# Patient Record
Sex: Male | Born: 1950 | Race: White | Hispanic: No | Marital: Married | Smoking: Never smoker
Health system: Southern US, Community
[De-identification: ages and names within clinical notes are randomized; demographics above are authoritative.]

## PROBLEM LIST (undated history)

## (undated) DIAGNOSIS — E119 Type 2 diabetes mellitus without complications: Secondary | ICD-10-CM

## (undated) DIAGNOSIS — M1990 Unspecified osteoarthritis, unspecified site: Secondary | ICD-10-CM

## (undated) DIAGNOSIS — I10 Essential (primary) hypertension: Secondary | ICD-10-CM

## (undated) DIAGNOSIS — R112 Nausea with vomiting, unspecified: Secondary | ICD-10-CM

## (undated) DIAGNOSIS — J301 Allergic rhinitis due to pollen: Secondary | ICD-10-CM

## (undated) DIAGNOSIS — E1140 Type 2 diabetes mellitus with diabetic neuropathy, unspecified: Secondary | ICD-10-CM

## (undated) DIAGNOSIS — K219 Gastro-esophageal reflux disease without esophagitis: Secondary | ICD-10-CM

## (undated) DIAGNOSIS — J42 Unspecified chronic bronchitis: Secondary | ICD-10-CM

## (undated) DIAGNOSIS — E669 Obesity, unspecified: Secondary | ICD-10-CM

## (undated) DIAGNOSIS — E039 Hypothyroidism, unspecified: Secondary | ICD-10-CM

## (undated) DIAGNOSIS — Z9889 Other specified postprocedural states: Secondary | ICD-10-CM

## (undated) DIAGNOSIS — J449 Chronic obstructive pulmonary disease, unspecified: Secondary | ICD-10-CM

## (undated) DIAGNOSIS — Z973 Presence of spectacles and contact lenses: Secondary | ICD-10-CM

## (undated) DIAGNOSIS — M1000 Idiopathic gout, unspecified site: Secondary | ICD-10-CM

## (undated) DIAGNOSIS — E7800 Pure hypercholesterolemia, unspecified: Secondary | ICD-10-CM

## (undated) HISTORY — PX: APPENDECTOMY: SHX54

## (undated) HISTORY — PX: INGUINAL HERNIA REPAIR: SUR1180

## (undated) HISTORY — PX: LUMBAR LAMINECTOMY: SHX95

## (undated) HISTORY — PX: FRACTURE SURGERY: SHX138

## (undated) HISTORY — PX: TYMPANOPLASTY: SHX33

---

## 2002-06-22 ENCOUNTER — Ambulatory Visit (HOSPITAL_COMMUNITY): Admission: RE | Admit: 2002-06-22 | Discharge: 2002-06-23 | Payer: Self-pay | Admitting: Neurosurgery

## 2002-06-22 ENCOUNTER — Encounter: Payer: Self-pay | Admitting: Neurosurgery

## 2004-04-25 ENCOUNTER — Ambulatory Visit: Payer: Self-pay

## 2006-12-19 ENCOUNTER — Ambulatory Visit: Payer: Self-pay | Admitting: Emergency Medicine

## 2006-12-21 ENCOUNTER — Ambulatory Visit: Payer: Self-pay | Admitting: Family Medicine

## 2007-03-01 ENCOUNTER — Ambulatory Visit: Payer: Self-pay | Admitting: Family Medicine

## 2010-06-19 ENCOUNTER — Ambulatory Visit: Payer: Self-pay

## 2010-08-13 ENCOUNTER — Ambulatory Visit: Payer: Self-pay | Admitting: Anesthesiology

## 2010-08-17 ENCOUNTER — Ambulatory Visit: Payer: Self-pay | Admitting: General Practice

## 2010-08-17 HISTORY — PX: KNEE ARTHROSCOPY W/ PARTIAL MEDIAL MENISCECTOMY: SHX1882

## 2011-01-22 HISTORY — PX: ROTATOR CUFF REPAIR: SHX139

## 2011-04-09 ENCOUNTER — Ambulatory Visit: Payer: Self-pay | Admitting: Unknown Physician Specialty

## 2011-04-09 LAB — URINALYSIS, COMPLETE
Bilirubin,UR: NEGATIVE
Blood: NEGATIVE
Hyaline Cast: 1
Ketone: NEGATIVE
Ph: 5 (ref 4.5–8.0)
RBC,UR: 1 /HPF (ref 0–5)
Specific Gravity: 1.015 (ref 1.003–1.030)
Squamous Epithelial: NONE SEEN

## 2011-04-09 LAB — CBC
HCT: 40.6 % (ref 40.0–52.0)
MCH: 30.5 pg (ref 26.0–34.0)
MCHC: 33.2 g/dL (ref 32.0–36.0)
MCV: 92 fL (ref 80–100)
RDW: 13.1 % (ref 11.5–14.5)
WBC: 8.4 10*3/uL (ref 3.8–10.6)

## 2011-04-15 ENCOUNTER — Ambulatory Visit: Payer: Self-pay | Admitting: Unknown Physician Specialty

## 2012-03-09 ENCOUNTER — Ambulatory Visit: Payer: Self-pay | Admitting: Gastroenterology

## 2014-05-15 NOTE — Op Note (Signed)
PATIENT NAME:  Nicholas Humphrey, Nicholas Humphrey MR#:  [id number] DATE OF BIRTH:  08-11-1950  DATE OF PROCEDURE:  04/15/2011  PREOPERATIVE DIAGNOSIS: Torn right rotator cuff.  PROCEDURE: Arthroscopic rotator cuff repair   SURGEON: Kathrene Alu., MD   INDICATION FOR PROCEDURE: The patient had a recent injury. He fell and then experienced pain in his right shoulder. His plain films did not reveal any significant abnormality but MR arthrogram did reveal a significant rotator cuff tear. The patient was brought in for surgery.   PROCEDURE: The patient was taken to the preoperative area where satisfactory interscalene block was achieved on his right shoulder. He was then taken to the operating room where satisfactory general anesthesia was achieved. The patient was then turned to the lateral decubitus position with the right shoulder up. The right shoulder was then prepped and draped in the usual fashion for an arthroscopic procedure. The shoulder was suspended with an Acufex shoulder suspension device. The shoulder was maintained in about 25 to 30 degrees of abduction. We initially used 10 pounds of traction but this was changed to 7.   The patient received 2 grams Kefzol IV at the start of the procedure.   Next, the scope was introduced through a posterior puncture wound into the glenohumeral joint. The joint was distended with lactated Ringer's. We used the Mitek fluid pump to facilitate joint distention.   The patient's articular surfaces were smooth. The biceps tendon was intact. The patient's labrum was frayed. The fraying was consistent with his age. No loose bodies were appreciated. The patient was noted to have a significant tear in the area of his supraspinatus and involving some of his infraspinatus.   I removed the scope from the glenohumeral joint and inserted it into the subacromial space.    Visualization of the subacromial space revealed the patient indeed had a significant cuff tear that was  probably retracted about 2 to 3 cm from anterior to posterior. The patient's tear was probably about 3 cm. I established a lateral portal. I inserted a synovial resector to debride thickened bursal tissue. A small elevator was inserted to free up the cuff superiorly. I then inserted a grasper. When I pulled on the cuff, I could bring it back to its attachment to the greater tuberosity.   I went ahead and established an anterior portal for suture management. I also established a portal about the anterolateral aspect of the acromion for insertion of the anchors.   Next, I used the anterolateral portal for insertion of a Spartan anchor into the greater tuberosity near the articular margin. I placed the suture that was in the anchor through the rotator cuff in inverted horizontal mattress fashion. I used the ArthroCare first pass instrument to insert the suture in the cuff. This suture was brought out the anterior portal. I next inserted three more Magnum wire sutures into the cuff in inverted horizontal mattress fashion starting from anterior to posterior. Again, I used the first pass to facilitate placing the sutures in the cuff. I then went ahead and inserted from posterior to anterior three ArthroCare speed screws. The speed screws were 5.5 mm in diameter.   Incidentally, the Spartan anchor was 6.5 mm in diameter.   All the Magnum wire inverted mattress sutures placed in the cuff were tightened down to each speed screw again working from posterior to anterior. After the cuff was reapproximated to its footprint with the above anchor and suture system, I went ahead and tied down  the suture attached to the [facility] implant. This gave me a two row repair of the torn cuff.   Inspection of the cuff at this time revealed that it seemed to be well secured and was quite stable.   I removed the scope and then closed the puncture wounds with 3-0 nylon in vertical mattress fashion. Several mL of 0.5% Marcaine without  epinephrine was injected about each puncture wound and then Betadine was applied to the wounds followed by a sterile dressing. Four TENS pads were placed about the shoulder and then a shoulder immobilizer was applied. The patient was then awakened and transferred to his stretcher bed. He was taken to the recovery room in satisfactory condition. Blood loss was negligible.   An ice pack was applied to his right shoulder when he was transferred to the recovery room.      SUMMARY OF IMPLANTS USED: I used a Spartan 6.5 mm anchor and three 5.5 speed screw anchors.    ____________________________ Kathrene Alu., MD hbk:drc D: 04/15/2011 17:30:13 ET T: 04/15/2011 17:41:53 ET JOB#: [id number]  cc: Kathrene Alu., MD, <Dictator> Vilinda Flake, JR MD ELECTRONICALLY SIGNED 05/12/2011 21:22

## 2015-09-27 ENCOUNTER — Encounter: Payer: Self-pay | Admitting: *Deleted

## 2015-10-04 ENCOUNTER — Ambulatory Visit: Payer: BLUE CROSS/BLUE SHIELD | Admitting: Anesthesiology

## 2015-10-04 ENCOUNTER — Ambulatory Visit
Admission: RE | Admit: 2015-10-04 | Discharge: 2015-10-04 | Disposition: A | Discharge status: Home / Self Care | Payer: BLUE CROSS/BLUE SHIELD | Source: Ambulatory Visit | Attending: Surgery | Admitting: Surgery

## 2015-10-04 ENCOUNTER — Encounter
Admission: RE | Disposition: A | Discharge status: Home / Self Care | Payer: Self-pay | Source: Ambulatory Visit | Attending: Surgery

## 2015-10-04 DIAGNOSIS — E1121 Type 2 diabetes mellitus with diabetic nephropathy: Secondary | ICD-10-CM | POA: Diagnosis not present

## 2015-10-04 DIAGNOSIS — Z823 Family history of stroke: Secondary | ICD-10-CM | POA: Diagnosis not present

## 2015-10-04 DIAGNOSIS — Z833 Family history of diabetes mellitus: Secondary | ICD-10-CM | POA: Diagnosis not present

## 2015-10-04 DIAGNOSIS — Z7982 Long term (current) use of aspirin: Secondary | ICD-10-CM | POA: Insufficient documentation

## 2015-10-04 DIAGNOSIS — E1140 Type 2 diabetes mellitus with diabetic neuropathy, unspecified: Secondary | ICD-10-CM | POA: Insufficient documentation

## 2015-10-04 DIAGNOSIS — Z803 Family history of malignant neoplasm of breast: Secondary | ICD-10-CM | POA: Insufficient documentation

## 2015-10-04 DIAGNOSIS — E785 Hyperlipidemia, unspecified: Secondary | ICD-10-CM | POA: Insufficient documentation

## 2015-10-04 DIAGNOSIS — M65341 Trigger finger, right ring finger: Secondary | ICD-10-CM | POA: Insufficient documentation

## 2015-10-04 DIAGNOSIS — K219 Gastro-esophageal reflux disease without esophagitis: Secondary | ICD-10-CM | POA: Diagnosis not present

## 2015-10-04 DIAGNOSIS — E039 Hypothyroidism, unspecified: Secondary | ICD-10-CM | POA: Diagnosis not present

## 2015-10-04 DIAGNOSIS — I10 Essential (primary) hypertension: Secondary | ICD-10-CM | POA: Insufficient documentation

## 2015-10-04 DIAGNOSIS — Z6834 Body mass index (BMI) 34.0-34.9, adult: Secondary | ICD-10-CM | POA: Diagnosis not present

## 2015-10-04 DIAGNOSIS — Z794 Long term (current) use of insulin: Secondary | ICD-10-CM | POA: Diagnosis not present

## 2015-10-04 HISTORY — DX: Gastro-esophageal reflux disease without esophagitis: K21.9

## 2015-10-04 HISTORY — DX: Type 2 diabetes mellitus without complications: E11.9

## 2015-10-04 HISTORY — DX: Essential (primary) hypertension: I10

## 2015-10-04 HISTORY — DX: Type 2 diabetes mellitus with diabetic neuropathy, unspecified: E11.40

## 2015-10-04 HISTORY — DX: Presence of spectacles and contact lenses: Z97.3

## 2015-10-04 HISTORY — DX: Hypothyroidism, unspecified: E03.9

## 2015-10-04 HISTORY — DX: Other specified postprocedural states: Z98.890

## 2015-10-04 HISTORY — DX: Nausea with vomiting, unspecified: R11.2

## 2015-10-04 HISTORY — PX: TRIGGER FINGER RELEASE: SHX641

## 2015-10-04 HISTORY — DX: Pure hypercholesterolemia, unspecified: E78.00

## 2015-10-04 LAB — GLUCOSE, CAPILLARY
GLUCOSE-CAPILLARY: 164 mg/dL — AB (ref 65–99)
GLUCOSE-CAPILLARY: 160 mg/dL — AB (ref 65–99)

## 2015-10-04 SURGERY — RELEASE, A1 PULLEY, FOR TRIGGER FINGER
Anesthesia: Monitor Anesthesia Care | Laterality: Right | Wound class: Clean

## 2015-10-04 MED ORDER — TRAMADOL HCL 50 MG PO TABS: 50.0000 mg | ORAL_TABLET | Freq: Four times a day (QID) | ORAL | 0 refills | Status: DC | PRN

## 2015-10-04 MED ORDER — CEFAZOLIN SODIUM-DEXTROSE 2-4 GM/100ML-% IV SOLN
2.0000 g | Freq: Once | INTRAVENOUS | Status: AC
  Administered 2015-10-04: 2 g via INTRAVENOUS

## 2015-10-04 MED ORDER — HYDROCODONE-ACETAMINOPHEN 5-325 MG PO TABS: 1.0000 | ORAL_TABLET | ORAL | Status: DC | PRN

## 2015-10-04 MED ORDER — MIDAZOLAM HCL 5 MG/5ML IJ SOLN
INTRAMUSCULAR | Status: DC | PRN
  Administered 2015-10-04: 2 mg via INTRAVENOUS

## 2015-10-04 MED ORDER — LACTATED RINGERS IV SOLN
INTRAVENOUS | Status: DC
  Administered 2015-10-04 (×2): via INTRAVENOUS

## 2015-10-04 MED ORDER — PROPOFOL 500 MG/50ML IV EMUL
INTRAVENOUS | Status: DC | PRN
Start: 2015-10-04 — End: 2015-10-04
  Administered 2015-10-04: 50 ug/kg/min via INTRAVENOUS

## 2015-10-04 MED ORDER — BUPIVACAINE HCL (PF) 0.5 % IJ SOLN
INTRAMUSCULAR | Status: DC | PRN
  Administered 2015-10-04: 10 mL

## 2015-10-04 MED ORDER — ONDANSETRON HCL 4 MG/2ML IJ SOLN
4.0000 mg | Freq: Once | INTRAMUSCULAR | Status: AC | PRN
  Administered 2015-10-04: 4 mg via INTRAVENOUS

## 2015-10-04 MED ORDER — OXYCODONE HCL 5 MG PO TABS: 5.0000 mg | ORAL_TABLET | Freq: Once | ORAL | Status: DC

## 2015-10-04 MED ORDER — FENTANYL CITRATE (PF) 100 MCG/2ML IJ SOLN
25.0000 ug | INTRAMUSCULAR | Status: DC | PRN
  Administered 2015-10-04: 100 ug via INTRAVENOUS

## 2015-10-04 SURGICAL SUPPLY — 23 items
BANDAGE ELASTIC 2 LF NS (GAUZE/BANDAGES/DRESSINGS) ×3 IMPLANT
BNDG ESMARK 4X12 TAN STRL LF (GAUZE/BANDAGES/DRESSINGS) ×3 IMPLANT
CHLORAPREP W/TINT 26ML (MISCELLANEOUS) ×3 IMPLANT
CORD BIP STRL DISP 12FT (MISCELLANEOUS) ×3 IMPLANT
COVER LIGHT HANDLE UNIVERSAL (MISCELLANEOUS) ×6 IMPLANT
CUFF TOURNIQUET DUAL PORT 18X3 (MISCELLANEOUS) ×3 IMPLANT
DECANTER SPIKE VIAL GLASS SM (MISCELLANEOUS) ×3 IMPLANT
GAUZE PETRO XEROFOAM 1X8 (MISCELLANEOUS) ×3 IMPLANT
GAUZE SPONGE 4X4 12PLY STRL (GAUZE/BANDAGES/DRESSINGS) ×3 IMPLANT
GLOVE BIO SURGEON STRL SZ8 (GLOVE) ×6 IMPLANT
GLOVE INDICATOR 8.0 STRL GRN (GLOVE) ×3 IMPLANT
GOWN STRL REUS W/ TWL LRG LVL3 (GOWN DISPOSABLE) ×1 IMPLANT
GOWN STRL REUS W/ TWL XL LVL3 (GOWN DISPOSABLE) ×1 IMPLANT
GOWN STRL REUS W/TWL LRG LVL3 (GOWN DISPOSABLE) ×2
GOWN STRL REUS W/TWL XL LVL3 (GOWN DISPOSABLE) ×2
KIT ROOM TURNOVER OR (KITS) ×3 IMPLANT
NS IRRIG 500ML POUR BTL (IV SOLUTION) ×3 IMPLANT
PACK EXTREMITY ARMC (MISCELLANEOUS) ×3 IMPLANT
STOCKINETTE IMPERVIOUS 9X36 MD (GAUZE/BANDAGES/DRESSINGS) ×3 IMPLANT
STRAP BODY AND KNEE 60X3 (MISCELLANEOUS) ×3 IMPLANT
SUT PROLENE 4 0 PS 2 18 (SUTURE) ×3 IMPLANT
SUT VIC AB 3-0 SH 27 (SUTURE)
SUT VIC AB 3-0 SH 27X BRD (SUTURE) IMPLANT

## 2015-10-04 NOTE — H&P (Signed)
Paper H&P to be scanned into permanent record. H&P reviewed. No changes. 

## 2015-10-04 NOTE — Anesthesia Preprocedure Evaluation (Signed)
Anesthesia Evaluation  Patient identified by MRN, date of birth, ID band Patient awake    Reviewed: Allergy & Precautions, H&P , NPO status , Patient's Chart, lab work & pertinent test results  Airway Mallampati: II  TM Distance: >3 FB Neck ROM: full    Dental no notable dental hx.    Pulmonary    Pulmonary exam normal        Cardiovascular hypertension, Normal cardiovascular exam     Neuro/Psych    GI/Hepatic GERD  ,  Endo/Other  diabetesHypothyroidism Morbid obesity  Renal/GU      Musculoskeletal   Abdominal   Peds  Hematology   Anesthesia Other Findings   Reproductive/Obstetrics                             Anesthesia Physical Anesthesia Plan  ASA: II  Anesthesia Plan: Bier Block and MAC   Post-op Pain Management:    Induction:   Airway Management Planned:   Additional Equipment:   Intra-op Plan:   Post-operative Plan:   Informed Consent: I have reviewed the patients History and Physical, chart, labs and discussed the procedure including the risks, benefits and alternatives for the proposed anesthesia with the patient or authorized representative who has indicated his/her understanding and acceptance.     Plan Discussed with:   Anesthesia Plan Comments:         Anesthesia Quick Evaluation

## 2015-10-04 NOTE — Op Note (Signed)
10/04/2015  12:25 PM  Patient:   Nicholas Humphrey  Pre-Op Diagnosis:   Right ring trigger finger.  Post-Op Diagnosis:   Same.  Procedure:   Release right ring trigger finger.  Surgeon:   Pascal Lux, MD  Assistant:   Donnie Coffin, PA-S  Anesthesia:   Bier block  Findings:   As above.  Complications:   None  EBL:   0 cc  Fluids:   600 cc crystalloid  TT:   23 minutes at 250 mmHg  Drains:   None  Closure:   4-0 Prolene  Implants:   None  Brief Clinical Note:   The patient is a 65 year old male with a history of recurrent painful catching of his right ring finger. These symptoms have progressed despite medications, activity modification, etc. The patient's history and examination were consistent with a right ring trigger finger. The patient presents at this time for a right ring trigger finger release.  Procedure:   The patient was brought into the operating room and lain in the supine position. After adequate IV sedation was achieved, a timeout was performed to verify the appropriate surgical site. A Bier block was placed by the anesthesiologist and the tourniquet inflated to 250 mmHg. The right hand and upper extremity were prepped with ChloraPrep solution before being draped sterilely. Preoperative antibiotics were administered. An approximately 1.5-2.0 cm incision was made over the volar aspect of the ring finger at the level of the metacarpal head centered over the flexor sheath. The incision was carried down through the subcutaneous tissues with care taken to identify and protect the digital neurovascular structures. The flexor sheath was entered just proximal to the A1 pulley. The sheath was released proximally for several centimeters under direct visualization. Distally, a clamp was placed beneath the A1 pulley and used to release any adhesions. The clamp was repositioned so that one jaw was superficial to and the other jaw deep to the A1 pulley. The A1 pulley was  incised on either side of the clamp to remove a 2 mm strip of tissue. Metzenbaum scissors was used to ensure complete release of the A1 pulley more distally. The underlying tendons were carefully inspected and found to be intact.   The wound was copiously irrigated with sterile saline solution before the wound was closed using 4-0 Prolene interrupted sutures. A total of 10 cc of 0.5% plain Sensorcaine was injected in and around the incision to help with postoperative analgesia before a sterile bulky dressing was applied to the hand. The patient was then awakened and returned to the recovery room in satisfactory condition after tolerating the procedure well.

## 2015-10-04 NOTE — Anesthesia Procedure Notes (Signed)
Procedure Name: MAC Performed by: Cullan Launer Pre-anesthesia Checklist: Patient identified, Emergency Drugs available, Suction available, Timeout performed and Patient being monitored Patient Re-evaluated:Patient Re-evaluated prior to inductionOxygen Delivery Method: Simple face mask Placement Confirmation: positive ETCO2       

## 2015-10-04 NOTE — Transfer of Care (Signed)
Immediate Anesthesia Transfer of Care Note  Patient: Nicholas Humphrey  Procedure(s) Performed: Procedure(s) with comments: RELEASE TRIGGER FINGER/A-1 PULLEY (Right) - PREFER BIER BLOCK Diabetic - insulin and oral meds  Patient Location: PACU  Anesthesia Type: Bier Block, MAC  Level of Consciousness: awake, alert  and patient cooperative  Airway and Oxygen Therapy: Patient Spontanous Breathing and Patient connected to supplemental oxygen  Post-op Assessment: Post-op Vital signs reviewed, Patient's Cardiovascular Status Stable, Respiratory Function Stable, Patent Airway and No signs of Nausea or vomiting  Post-op Vital Signs: Reviewed and stable  Complications: No apparent anesthesia complications

## 2015-10-04 NOTE — Anesthesia Postprocedure Evaluation (Signed)
Anesthesia Post Note  Patient: Nicholas Humphrey  Procedure(s) Performed: Procedure(s) (LRB): RELEASE TRIGGER FINGER/A-1 PULLEY (Right)  Patient location during evaluation: PACU Anesthesia Type: Bier Block and MAC Level of consciousness: awake and alert and oriented Pain management: satisfactory to patient Vital Signs Assessment: post-procedure vital signs reviewed and stable Respiratory status: spontaneous breathing, nonlabored ventilation and respiratory function stable Cardiovascular status: blood pressure returned to baseline and stable Postop Assessment: Adequate PO intake and No signs of nausea or vomiting Anesthetic complications: no    Raliegh Ip

## 2015-10-04 NOTE — Discharge Instructions (Signed)
General Anesthesia, Adult, Care After Refer to this sheet in the next few weeks. These instructions provide you with information on caring for yourself after your procedure. Your health care provider may also give you more specific instructions. Your treatment has been planned according to current medical practices, but problems sometimes occur. Call your health care provider if you have any problems or questions after your procedure. WHAT TO EXPECT AFTER THE PROCEDURE After the procedure, it is typical to experience:  Sleepiness.  Nausea and vomiting. HOME CARE INSTRUCTIONS  For the first 24 hours after general anesthesia:  Have a responsible person with you.  Do not drive a car. If you are alone, do not take public transportation.  Do not drink alcohol.  Do not take medicine that has not been prescribed by your health care provider.  Do not sign important papers or make important decisions.  You may resume a normal diet and activities as directed by your health care provider.  Change bandages (dressings) as directed.  If you have questions or problems that seem related to general anesthesia, call the hospital and ask for the anesthetist or anesthesiologist on call. SEEK MEDICAL CARE IF:  You have nausea and vomiting that continue the day after anesthesia.  You develop a rash. SEEK IMMEDIATE MEDICAL CARE IF:   You have difficulty breathing.  You have chest pain.  You have any allergic problems.   This information is not intended to replace advice given to you by your health care provider. Make sure you discuss any questions you have with your health care provider.   Document Released: 04/15/2000 Document Revised: 01/28/2014 Document Reviewed: 05/08/2011 Elsevier Interactive Patient Education 2016 [employer].  Keep dressing dry and intact. Keep hand elevated above heart level. May shower after dressing removed on postop day 4 (Sunday). Cover sutures with Band-Aids  after drying off. Apply ice to affected area frequently. Take ibuprofen 800 mg TID with meals for 7-10 days, then as necessary. Take pain medication as prescribed when needed.  Return for follow-up in 10-14 days or as scheduled.

## 2015-10-04 NOTE — Anesthesia Procedure Notes (Signed)
Anesthesia Regional Block:  Bier block (IV Regional)  Pre-Anesthetic Checklist: ,, timeout performed, Correct Patient, Correct Site, Correct Laterality, Correct Procedure, Correct Position, site marked, Risks and benefits discussed, Surgical consent,  Pre-op evaluation,  At surgeon's request  Laterality: Right     Bier block (IV Regional) Narrative:  Start time: 10/04/2015 11:57 AM End time: 10/04/2015 11:59 AM Injection made incrementally with aspirations every 35 mL.  Performed by: With CRNAs  Anesthesiologist: Ronelle Nigh  Additional Notes: R arm exsanguinated with esmarch. ProximalTourniquet inflated to 232mm Hg. Radial pulse absent. Injected 33m 0.5% Lidocaine without incident. 20g angio to R hand removed without difficulty. Tolerated well by pt.

## 2015-10-05 ENCOUNTER — Encounter: Payer: Self-pay | Admitting: Surgery

## 2016-01-22 DIAGNOSIS — W11XXXA Fall on and from ladder, initial encounter: Secondary | ICD-10-CM

## 2016-01-22 HISTORY — DX: Fall on and from ladder, initial encounter: W11.XXXA

## 2016-01-22 HISTORY — PX: HAND SURGERY: SHX662

## 2016-01-22 HISTORY — PX: ELBOW SURGERY: SHX618

## 2016-07-08 ENCOUNTER — Ambulatory Visit
Admission: RE | Admit: 2016-07-08 | Discharge: 2016-07-08 | Disposition: A | Discharge status: Home / Self Care | Payer: 59 | Source: Ambulatory Visit | Attending: Nurse Practitioner | Admitting: Nurse Practitioner

## 2016-07-08 ENCOUNTER — Other Ambulatory Visit: Payer: Self-pay | Admitting: Nurse Practitioner

## 2016-07-08 DIAGNOSIS — R197 Diarrhea, unspecified: Secondary | ICD-10-CM

## 2016-07-08 DIAGNOSIS — R10821 Right upper quadrant rebound abdominal tenderness: Secondary | ICD-10-CM | POA: Diagnosis present

## 2016-07-08 DIAGNOSIS — K838 Other specified diseases of biliary tract: Secondary | ICD-10-CM | POA: Insufficient documentation

## 2016-07-10 ENCOUNTER — Other Ambulatory Visit: Payer: Self-pay | Admitting: Nurse Practitioner

## 2016-07-10 DIAGNOSIS — R1011 Right upper quadrant pain: Secondary | ICD-10-CM

## 2016-07-18 ENCOUNTER — Ambulatory Visit
Admission: RE | Admit: 2016-07-18 | Discharge: 2016-07-18 | Disposition: A | Discharge status: Home / Self Care | Payer: 59 | Source: Ambulatory Visit | Attending: Nurse Practitioner | Admitting: Nurse Practitioner

## 2016-07-18 DIAGNOSIS — R1011 Right upper quadrant pain: Secondary | ICD-10-CM | POA: Insufficient documentation

## 2016-07-18 MED ORDER — TECHNETIUM TC 99M MEBROFENIN IV KIT
5.0000 | PACK | Freq: Once | INTRAVENOUS | Status: AC | PRN
  Administered 2016-07-18: 4.913 via INTRAVENOUS

## 2016-08-06 DIAGNOSIS — I10 Essential (primary) hypertension: Secondary | ICD-10-CM | POA: Insufficient documentation

## 2016-08-06 DIAGNOSIS — K5289 Other specified noninfective gastroenteritis and colitis: Secondary | ICD-10-CM | POA: Diagnosis not present

## 2016-08-06 DIAGNOSIS — Z79899 Other long term (current) drug therapy: Secondary | ICD-10-CM | POA: Diagnosis not present

## 2016-08-06 DIAGNOSIS — E119 Type 2 diabetes mellitus without complications: Secondary | ICD-10-CM | POA: Diagnosis not present

## 2016-08-06 DIAGNOSIS — Z7982 Long term (current) use of aspirin: Secondary | ICD-10-CM | POA: Diagnosis not present

## 2016-08-06 DIAGNOSIS — R1030 Lower abdominal pain, unspecified: Secondary | ICD-10-CM | POA: Diagnosis present

## 2016-08-06 DIAGNOSIS — Z794 Long term (current) use of insulin: Secondary | ICD-10-CM | POA: Diagnosis not present

## 2016-08-06 DIAGNOSIS — A045 Campylobacter enteritis: Secondary | ICD-10-CM | POA: Insufficient documentation

## 2016-08-06 DIAGNOSIS — E039 Hypothyroidism, unspecified: Secondary | ICD-10-CM | POA: Diagnosis not present

## 2016-08-06 LAB — COMPREHENSIVE METABOLIC PANEL
ALBUMIN: 4.1 g/dL (ref 3.5–5.0)
ALT: 25 U/L (ref 17–63)
ANION GAP: 10 (ref 5–15)
AST: 33 U/L (ref 15–41)
Alkaline Phosphatase: 51 U/L (ref 38–126)
BUN: 16 mg/dL (ref 6–20)
CHLORIDE: 105 mmol/L (ref 101–111)
CO2: 26 mmol/L (ref 22–32)
Calcium: 8.4 mg/dL — ABNORMAL LOW (ref 8.9–10.3)
Creatinine, Ser: 1.01 mg/dL (ref 0.61–1.24)
GFR calc non Af Amer: >60 mL/min (ref >60)
GLUCOSE: 108 mg/dL — AB (ref 65–99)
POTASSIUM: 3.6 mmol/L (ref 3.5–5.1)
SODIUM: 141 mmol/L (ref 135–145)
Total Bilirubin: 0.6 mg/dL (ref 0.3–1.2)
Total Protein: 7 g/dL (ref 6.5–8.1)

## 2016-08-06 LAB — CBC
HEMATOCRIT: 36.1 % — AB (ref 40.0–52.0)
HEMOGLOBIN: 12.2 g/dL — AB (ref 13.0–18.0)
MCH: 29.7 pg (ref 26.0–34.0)
MCHC: 33.7 g/dL (ref 32.0–36.0)
MCV: 88 fL (ref 80.0–100.0)
Platelets: 193 10*3/uL (ref 150–440)
RBC: 4.1 MIL/uL — AB (ref 4.40–5.90)
RDW: 13.4 % (ref 11.5–14.5)
WBC: 12.7 10*3/uL — ABNORMAL HIGH (ref 3.8–10.6)

## 2016-08-06 LAB — LIPASE, BLOOD: LIPASE: 35 U/L (ref 11–51)

## 2016-08-06 NOTE — ED Triage Notes (Signed)
Patient ambulatory to triage with steady gait, without difficulty or distress noted; pt reports lower abd pain x 6wks accomp by diarrhea today; have seen his PCP, CT scan negative month ago, and plan is for UGI

## 2016-08-07 ENCOUNTER — Emergency Department
Admission: EM | Admit: 2016-08-07 | Discharge: 2016-08-07 | Disposition: A | Discharge status: Home / Self Care | Payer: BLUE CROSS/BLUE SHIELD | Attending: Emergency Medicine | Admitting: Emergency Medicine

## 2016-08-07 ENCOUNTER — Emergency Department: Payer: BLUE CROSS/BLUE SHIELD

## 2016-08-07 ENCOUNTER — Telehealth: Payer: Self-pay | Admitting: Emergency Medicine

## 2016-08-07 DIAGNOSIS — A045 Campylobacter enteritis: Secondary | ICD-10-CM

## 2016-08-07 DIAGNOSIS — K529 Noninfective gastroenteritis and colitis, unspecified: Secondary | ICD-10-CM

## 2016-08-07 DIAGNOSIS — K5289 Other specified noninfective gastroenteritis and colitis: Secondary | ICD-10-CM | POA: Diagnosis not present

## 2016-08-07 LAB — URINALYSIS, COMPLETE (UACMP) WITH MICROSCOPIC
Bacteria, UA: NONE SEEN
Bilirubin Urine: NEGATIVE
GLUCOSE, UA: NEGATIVE mg/dL
KETONES UR: NEGATIVE mg/dL
LEUKOCYTES UA: NEGATIVE
NITRITE: NEGATIVE
PH: 6 (ref 5.0–8.0)
Protein, ur: NEGATIVE mg/dL
SQUAMOUS EPITHELIAL / LPF: NONE SEEN
Specific Gravity, Urine: 1.034 — ABNORMAL HIGH (ref 1.005–1.030)

## 2016-08-07 LAB — GASTROINTESTINAL PANEL BY PCR, STOOL (REPLACES STOOL CULTURE)
Adenovirus F40/41: NOT DETECTED
Astrovirus: NOT DETECTED
CRYPTOSPORIDIUM: NOT DETECTED
CYCLOSPORA CAYETANENSIS: NOT DETECTED
Campylobacter species: DETECTED — AB
ENTAMOEBA HISTOLYTICA: NOT DETECTED
Enteroaggregative E coli (EAEC): NOT DETECTED
Enteropathogenic E coli (EPEC): NOT DETECTED
Enterotoxigenic E coli (ETEC): NOT DETECTED
Giardia lamblia: NOT DETECTED
NOROVIRUS GI/GII: NOT DETECTED
Plesimonas shigelloides: NOT DETECTED
Rotavirus A: NOT DETECTED
SALMONELLA SPECIES: NOT DETECTED
SAPOVIRUS (I, II, IV, AND V): NOT DETECTED
SHIGA LIKE TOXIN PRODUCING E COLI (STEC): NOT DETECTED
SHIGELLA/ENTEROINVASIVE E COLI (EIEC): NOT DETECTED
VIBRIO CHOLERAE: NOT DETECTED
VIBRIO SPECIES: NOT DETECTED
YERSINIA ENTEROCOLITICA: NOT DETECTED

## 2016-08-07 MED ORDER — CIPROFLOXACIN HCL 500 MG PO TABS: 500.0000 mg | ORAL_TABLET | Freq: Two times a day (BID) | ORAL | 0 refills | Status: AC

## 2016-08-07 MED ORDER — METRONIDAZOLE 500 MG PO TABS: 500.0000 mg | ORAL_TABLET | Freq: Two times a day (BID) | ORAL | 0 refills | Status: AC

## 2016-08-07 MED ORDER — IOPAMIDOL (ISOVUE-300) INJECTION 61%
100.0000 mL | Freq: Once | INTRAVENOUS | Status: AC | PRN
  Administered 2016-08-07: 100 mL via INTRAVENOUS

## 2016-08-07 MED ORDER — MORPHINE SULFATE (PF) 2 MG/ML IV SOLN
2.0000 mg | Freq: Once | INTRAVENOUS | Status: AC
  Administered 2016-08-07: 2 mg via INTRAVENOUS

## 2016-08-07 MED ORDER — ONDANSETRON HCL 4 MG/2ML IJ SOLN
4.0000 mg | Freq: Once | INTRAMUSCULAR | Status: AC
  Administered 2016-08-07: 4 mg via INTRAVENOUS

## 2016-08-07 MED ORDER — ONDANSETRON HCL 4 MG/2ML IJ SOLN
INTRAMUSCULAR | Status: AC
  Administered 2016-08-07: 4 mg via INTRAVENOUS
  Filled 2016-08-07: qty 2

## 2016-08-07 MED ORDER — CIPROFLOXACIN HCL 500 MG PO TABS
500.0000 mg | ORAL_TABLET | Freq: Once | ORAL | Status: AC
  Administered 2016-08-07: 500 mg via ORAL
  Filled 2016-08-07: qty 1

## 2016-08-07 MED ORDER — METRONIDAZOLE 500 MG PO TABS
500.0000 mg | ORAL_TABLET | Freq: Once | ORAL | Status: AC
  Administered 2016-08-07: 500 mg via ORAL
  Filled 2016-08-07: qty 1

## 2016-08-07 MED ORDER — IOPAMIDOL (ISOVUE-300) INJECTION 61%
30.0000 mL | Freq: Once | INTRAVENOUS | Status: AC
  Administered 2016-08-07: 30 mL via ORAL

## 2016-08-07 MED ORDER — MORPHINE SULFATE (PF) 2 MG/ML IV SOLN
INTRAVENOUS | Status: AC
  Administered 2016-08-07: 2 mg via INTRAVENOUS
  Filled 2016-08-07: qty 1

## 2016-08-07 NOTE — ED Provider Notes (Signed)
[facility] Emergency Department Provider Note   Time seen: 12:30 AM  I have reviewed the triage vital signs and the nursing notes.   HISTORY  Chief Complaint Abdominal Pain and Diarrhea    HPI Nicholas Humphrey is a 66 y.o. male with below list of chronic medical conditions presents to the emergency department with 6 weeks history of lower abdominal pain that is currently 7 out of 10 accompanied by diarrhea that is nonbloody. Patient denies any fever afebrile on presentation with temperature 98.7. Patient states that he was evaluated by his primary care provider and the ultrasound and HIDA scan were performed which were normal. Patient states that he has a plan for an upper GI to be performed. Patient denies any urinary symptoms. Patient states early in the course of his illness he would have nonbloody emesis as well however that is resolved.   Past Medical History:  Diagnosis Date  . Diabetes mellitus without complication ([place])   . Diabetic neuropathy ([place])   . GERD (gastroesophageal reflux disease)   . Hypercholesteremia   . Hypertension   . Hypothyroidism   . PONV (postoperative nausea and vomiting)   . Wears contact lenses     There are no active problems to display for this patient.   Past Surgical History:  Procedure Laterality Date  . APPENDECTOMY    . INGUINAL HERNIA REPAIR Right   . KNEE ARTHROSCOPY W/ PARTIAL MEDIAL MENISCECTOMY Left 08/17/2010  . LUMBAR LAMINECTOMY    . ROTATOR CUFF REPAIR Right 2013  . TRIGGER FINGER RELEASE Right 10/04/2015   Procedure: RELEASE TRIGGER FINGER/A-1 PULLEY;  Surgeon: Corky Mull, MD;  Location: [place];  Service: Orthopedics;  Laterality: Right;  PREFER BIER BLOCK Diabetic - insulin and oral meds  . TYMPANOPLASTY Right     Prior to Admission medications   Medication Sig Start Date End Date Taking? Authorizing Provider  aspirin 81 MG tablet Take 81 mg by mouth daily.    [provider]  ciclopirox (PENLAC) 8 % solution Apply topically at bedtime. Apply over nail and surrounding skin. Apply daily over previous coat. After seven (7) days, may remove with alcohol and continue cycle.    [provider]  ciprofloxacin (CIPRO) 500 MG tablet Take 1 tablet (500 mg total) by mouth 2 (two) times daily. 08/07/16 08/17/16  Gregor Hams, MD  enalapril (VASOTEC) 5 MG tablet Take 10 mg by mouth daily.    [provider]  insulin aspart (NOVOLOG) 100 UNIT/ML injection Inject into the skin 3 (three) times daily before meals. 6 units before breakfast, 25 units before lunch, 44 units before dinner    [provider]  Insulin Degludec (TRESIBA FLEXTOUCH) 200 UNIT/ML SOPN Inject 126 Units into the skin daily.    [provider]  levothyroxine (SYNTHROID, LEVOTHROID) 150 MCG tablet Take 150 mcg by mouth daily before breakfast.    [provider]  metFORMIN (GLUCOPHAGE) 1000 MG tablet Take by mouth. 1 tab in AM. 1.5 tabs in PM.    [provider]  metroNIDAZOLE (FLAGYL) 500 MG tablet Take 1 tablet (500 mg total) by mouth 2 (two) times daily. 08/07/16 08/17/16  Gregor Hams, MD  montelukast (SINGULAIR) 10 MG tablet Take 10 mg by mouth at bedtime.    [provider]  Multiple Vitamin (MULTIVITAMIN) tablet Take 1 tablet by mouth daily.    [provider]  pantoprazole (PROTONIX) 40 MG tablet Take 40 mg by mouth  daily.    [provider]  pioglitazone (ACTOS) 45 MG tablet Take 45 mg by mouth daily.    [provider]  simvastatin (ZOCOR) 40 MG tablet Take 40 mg by mouth daily.    [provider]  traMADol (ULTRAM) 50 MG tablet Take 1-2 tablets (50-100 mg total) by mouth every 6 (six) hours as needed. 10/04/15   Poggi, Marshall Cork, MD    Allergies Crestor [rosuvastatin] and Lipitor [atorvastatin]  No family history on file.  Social History Social History  Substance Use Topics  . Smoking  status: Never Smoker  . Smokeless tobacco: Never Used  . Alcohol use No    Review of Systems Constitutional: No fever/chills Eyes: No visual changes. ENT: No sore throat. Cardiovascular: Denies chest pain. Respiratory: Denies shortness of breath. Gastrointestinal: Positive for abdominal pain and diarrhea  Genitourinary: Negative for dysuria. Musculoskeletal: Negative for neck pain.  Negative for back pain. Integumentary: Negative for rash. Neurological: Negative for headaches, focal weakness or numbness.   ____________________________________________   PHYSICAL EXAM:  VITAL SIGNS: ED Triage Vitals [08/06/16 2248]  Enc Vitals Group     BP (!) 182/80     Pulse Rate 95     Resp 18     Temp 98.7 F (37.1 C)     Temp Source Oral     SpO2 99 %     Weight 108 kg (238 lb)     Height 1.778 m (5\' 10" )     Head Circumference      Peak Flow      Pain Score 10     Pain Loc      Pain Edu?      Excl. in [place]?     Constitutional: Alert and oriented. Well appearing and in no acute distress. Eyes: Conjunctivae are normal. Head: Atraumatic. Mouth/Throat: Mucous membranes are moist.  Oropharynx non-erythematous. Neck: No stridor.  Cardiovascular: Normal rate, regular rhythm. Good peripheral circulation. Grossly normal heart sounds. Respiratory: Normal respiratory effort.  No retractions. Lungs CTAB. Gastrointestinal: Right upper quadrant/left lower quadrant tenderness to palpation. No distention.  Musculoskeletal: No lower extremity tenderness nor edema. No gross deformities of extremities. Neurologic:  Normal speech and language. No gross focal neurologic deficits are appreciated.  Skin:  Skin is warm, dry and intact. No rash noted. Psychiatric: Mood and affect are normal. Speech and behavior are normal.  ____________________________________________   LABS (all labs ordered are listed, but only abnormal results are displayed)  Labs Reviewed  GASTROINTESTINAL PANEL BY PCR,  STOOL (REPLACES STOOL CULTURE) - Abnormal; Notable for the following:       Result Value   Campylobacter species DETECTED (*)    All other components within normal limits  COMPREHENSIVE METABOLIC PANEL - Abnormal; Notable for the following:    Glucose, Bld 108 (*)    Calcium 8.4 (*)    All other components within normal limits  CBC - Abnormal; Notable for the following:    WBC 12.7 (*)    RBC 4.10 (*)    Hemoglobin 12.2 (*)    HCT 36.1 (*)    All other components within normal limits  URINALYSIS, COMPLETE (UACMP) WITH MICROSCOPIC - Abnormal; Notable for the following:    Color, Urine YELLOW (*)    APPearance CLEAR (*)    Specific Gravity, Urine 1.034 (*)    Hgb urine dipstick SMALL (*)    All other components within normal limits  LIPASE, BLOOD    RADIOLOGY I, [place] N BROWN,  personally viewed and evaluated these images (plain radiographs) as part of my medical decision making, as well as reviewing the written report by the radiologist.  Ct Abdomen Pelvis W Contrast  Result Date: 08/07/2016 CLINICAL DATA:  66 year old male with abdominal pain. EXAM: CT ABDOMEN AND PELVIS WITH CONTRAST TECHNIQUE: Multidetector CT imaging of the abdomen and pelvis was performed using the standard protocol following bolus administration of intravenous contrast. CONTRAST:  121mL ISOVUE-300 IOPAMIDOL (ISOVUE-300) INJECTION 61% COMPARISON:  Abdominal ultrasound dated 07/08/2016 FINDINGS: Lower chest: The visualized lung bases are clear. No intra-abdominal free air or free fluid. Hepatobiliary: There is diffuse fatty infiltration of the liver. No intrahepatic biliary ductal dilatation. The gallbladder is unremarkable. Pancreas: Unremarkable. No pancreatic ductal dilatation or surrounding inflammatory changes. Spleen: Normal in size without focal abnormality. Adrenals/Urinary Tract: Adrenal glands are unremarkable. Kidneys are normal, without renal calculi, focal lesion, or hydronephrosis. Bladder is  unremarkable. Stomach/Bowel: There is sigmoid diverticulosis with muscular hypertrophy. There is diffuse thickened and inflammatory changes of the ascending, and transverse colon consistent with colitis. No pneumatosis. There is no evidence of bowel obstruction. Appendectomy. Vascular/Lymphatic: There is mild aortoiliac atherosclerotic disease. The abdominal aorta and IVC are otherwise unremarkable. No portal venous gas. There is no adenopathy. Reproductive: The prostate and seminal vesicles are grossly unremarkable. Other: Small fat containing umbilical hernia. No fluid collection. Bilateral inguinal hernia repair mesh noted. Musculoskeletal: There is degenerative changes of the spine. Multilevel disc desiccation with vacuum phenomena. No acute fracture. IMPRESSION: 1. Colitis primarily involving the ascending and transverse colon. Correlation with clinical exam and stool cultures recommended. No bowel obstruction. 2. Sigmoid diverticulosis. 3.  Aortic Atherosclerosis (ICD10-I70.0). Electronically Signed   By: Anner Crete M.D.   On: 08/07/2016 02:07       Procedures   ____________________________________________   INITIAL IMPRESSION / ASSESSMENT AND PLAN / ED COURSE  Pertinent labs & imaging results that were available during my care of the patient were reviewed by me and considered in my medical decision making (see chart for details).  66 year old male presenting with history of physical exam concern for possible infectious etiology of abdominal pain and diarrhea. CT scan of the abdomen was performed which revealed colitis of the ascending and transverse colon. Patient was unable to give a stool sample while in the emergency department however given strong concern for infectious etiology patient was prescribed Cipro and Flagyl. Patient was able to actually give a stool sample right before leaving the emergency department which revealed Campylobacter       ____________________________________________  FINAL CLINICAL IMPRESSION(S) / ED DIAGNOSES  Final diagnoses:  Colitis  Campylobacter diarrhea     MEDICATIONS GIVEN DURING THIS VISIT:  Medications  ondansetron (ZOFRAN) injection 4 mg (4 mg Intravenous Given 08/07/16 0113)  morphine 2 MG/ML injection 2 mg (2 mg Intravenous Given 08/07/16 0116)  iopamidol (ISOVUE-300) 61 % injection 30 mL (30 mLs Oral Contrast Given 08/07/16 0108)  iopamidol (ISOVUE-300) 61 % injection 100 mL (100 mLs Intravenous Contrast Given 08/07/16 0130)  ciprofloxacin (CIPRO) tablet 500 mg (500 mg Oral Given 08/07/16 0342)  metroNIDAZOLE (FLAGYL) tablet 500 mg (500 mg Oral Given 08/07/16 0342)     NEW OUTPATIENT MEDICATIONS STARTED DURING THIS VISIT:  Discharge Medication List as of 08/07/2016  3:47 AM    START taking these medications   Details  ciprofloxacin (CIPRO) 500 MG tablet Take 1 tablet (500 mg total) by mouth 2 (two) times daily., Starting Wed 08/07/2016, Until Sat 08/17/2016, Print    metroNIDAZOLE (FLAGYL)  500 MG tablet Take 1 tablet (500 mg total) by mouth 2 (two) times daily., Starting Wed 08/07/2016, Until Sat 08/17/2016, Print        Discharge Medication List as of 08/07/2016  3:47 AM      Discharge Medication List as of 08/07/2016  3:47 AM       Note:  This document was prepared using Dragon voice recognition software and may include unintentional dictation errors.    Gregor Hams, MD 08/07/16 [phone]

## 2016-08-07 NOTE — ED Notes (Signed)
Date and time results received: 08/07/16  "6:08 AM"  (use smartphrase ".now" to insert current time)  Test: Stool panel Critical Value: positive for campylobacter Name of Provider Notified: Owens Shark  Orders Received? Or Actions Taken?: No new orders. Patient given appropriate antibiotics.

## 2016-08-07 NOTE — ED Notes (Signed)
Patient transported to CT 

## 2016-08-07 NOTE — Telephone Encounter (Signed)
Called patient to notify of stool culture results-campylobacter--per dr brown : no change in treatment.  Left message asking him to call me back.

## 2016-08-28 ENCOUNTER — Other Ambulatory Visit
Admission: RE | Admit: 2016-08-28 | Discharge: 2016-08-28 | Disposition: A | Discharge status: Home / Self Care | Payer: BLUE CROSS/BLUE SHIELD | Source: Ambulatory Visit | Attending: Gastroenterology | Admitting: Gastroenterology

## 2016-08-28 DIAGNOSIS — R197 Diarrhea, unspecified: Secondary | ICD-10-CM | POA: Diagnosis present

## 2016-08-28 LAB — GASTROINTESTINAL PANEL BY PCR, STOOL (REPLACES STOOL CULTURE)
ADENOVIRUS F40/41: NOT DETECTED
Astrovirus: NOT DETECTED
CRYPTOSPORIDIUM: NOT DETECTED
CYCLOSPORA CAYETANENSIS: NOT DETECTED
Campylobacter species: NOT DETECTED
ENTEROAGGREGATIVE E COLI (EAEC): NOT DETECTED
ENTEROPATHOGENIC E COLI (EPEC): NOT DETECTED
Entamoeba histolytica: NOT DETECTED
Enterotoxigenic E coli (ETEC): NOT DETECTED
GIARDIA LAMBLIA: NOT DETECTED
Norovirus GI/GII: NOT DETECTED
Plesimonas shigelloides: NOT DETECTED
ROTAVIRUS A: NOT DETECTED
Salmonella species: NOT DETECTED
Sapovirus (I, II, IV, and V): NOT DETECTED
Shiga like toxin producing E coli (STEC): NOT DETECTED
Shigella/Enteroinvasive E coli (EIEC): NOT DETECTED
VIBRIO CHOLERAE: NOT DETECTED
VIBRIO SPECIES: NOT DETECTED
YERSINIA ENTEROCOLITICA: NOT DETECTED

## 2016-08-28 LAB — C DIFFICILE QUICK SCREEN W PCR REFLEX
C DIFFICILE (CDIFF) INTERP: NOT DETECTED
C Diff antigen: NEGATIVE
C Diff toxin: NEGATIVE

## 2016-09-25 ENCOUNTER — Other Ambulatory Visit: Payer: Self-pay

## 2016-10-08 ENCOUNTER — Ambulatory Visit (INDEPENDENT_AMBULATORY_CARE_PROVIDER_SITE_OTHER): Payer: 59 | Admitting: Urology

## 2016-10-08 ENCOUNTER — Encounter: Payer: Self-pay | Admitting: Urology

## 2016-10-08 VITALS — BP 146/80 | HR 99 | Ht 70.0 in | Wt 230.0 lb

## 2016-10-08 DIAGNOSIS — R350 Frequency of micturition: Secondary | ICD-10-CM | POA: Diagnosis not present

## 2016-10-08 LAB — URINALYSIS, COMPLETE
BILIRUBIN UA: NEGATIVE
KETONES UA: NEGATIVE
LEUKOCYTES UA: NEGATIVE
Nitrite, UA: NEGATIVE
SPEC GRAV UA: 1.015 (ref 1.005–1.030)
Urobilinogen, Ur: 0.2 mg/dL (ref 0.2–1.0)
pH, UA: 5.5 (ref 5.0–7.5)

## 2016-10-08 LAB — BLADDER SCAN AMB NON-IMAGING: Scan Result: 584

## 2016-10-08 LAB — MICROSCOPIC EXAMINATION
Epithelial Cells (non renal): NONE SEEN /hpf (ref 0–10)
WBC, UA: NONE SEEN /hpf (ref 0–?)

## 2016-10-08 MED ORDER — TAMSULOSIN HCL 0.4 MG PO CAPS: 0.4000 mg | ORAL_CAPSULE | Freq: Every day | ORAL | 11 refills | Status: DC

## 2016-10-08 NOTE — Progress Notes (Signed)
Simple Catheter Placement  Due to urinary retention patient is present today for a foley cath placement.  Patient was cleaned and prepped in a sterile fashion with betadine and lidocaine jelly 2% was instilled into the urethra.  A 16 FR foley catheter was inserted, urine return was noted  774ml, urine was dark orange in color.  The balloon was filled with 10cc of sterile water.  A leg bag was attached for drainage. Patient was also given a night bag to take home and was given instruction on how to change from one bag to another.  Patient was given instruction on proper catheter care.  Patient tolerated well, no complications were noted   Preformed by: Elberta Leatherwood, CMA  Additional notes/ Follow up: 2 weeks Voiding trial

## 2016-10-08 NOTE — Progress Notes (Signed)
10/08/2016 3:05 PM   Nicholas Humphrey [birth year] [id number]  Referring provider: Sallee Lange, NP [address] [place], [place] [address]  Chief Complaint  Patient presents with  . Urinary Frequency     HPI: The patient was acutely added on to clinic today with small-volume frequency. He fell last Thursday from a ladder and has extensive left arm wrist and elbow injuries requiring surgery. He may need more surgery. He bruises left leg and hip but had no pelvic fracture  He was voiding well until about a month ago when he started having a slower flow with hourly nocturia. He was voiding only 4 or 5 times a day. Since his surgery he's been voiding every 15 minutes and even 18 times last night. He has had constipation but this is improving in the last day or so. He was not placed on Flomax  I reviewed the medical record and a recent CT scan demonstrated normal kidneys and an empty bladder. No urine cultures have been sent  Modifying factors: There are no other modifying factors  Associated signs and symptoms: There are no other associated signs and symptoms Aggravating and relieving factors: There are no other aggravating or relieving factors Severity: Moderate Duration: Persistent     PMH: Past Medical History:  Diagnosis Date  . Diabetes mellitus without complication ([place])   . Diabetic neuropathy ([place])   . GERD (gastroesophageal reflux disease)   . Hypercholesteremia   . Hypertension   . Hypothyroidism   . PONV (postoperative nausea and vomiting)   . Wears contact lenses     Surgical History: Past Surgical History:  Procedure Laterality Date  . APPENDECTOMY    . INGUINAL HERNIA REPAIR Right   . KNEE ARTHROSCOPY W/ PARTIAL MEDIAL MENISCECTOMY Left 08/17/2010  . LUMBAR LAMINECTOMY    . ROTATOR CUFF REPAIR Right 2013  . TRIGGER FINGER RELEASE Right 10/04/2015   Procedure: RELEASE TRIGGER FINGER/A-1 PULLEY;  Surgeon: Corky Mull, MD;  Location: [place];  Service: Orthopedics;  Laterality: Right;  PREFER BIER BLOCK Diabetic - insulin and oral meds  . TYMPANOPLASTY Right     Home Medications:  Allergies as of 10/08/2016      Reactions   Crestor [rosuvastatin]    Muscle pain   Lipitor [atorvastatin]    Muscle pain      Medication List       Accurate as of 10/08/16  3:05 PM. Always use your most recent med list.          aspirin 81 MG tablet Take 81 mg by mouth daily.   ciclopirox 8 % solution Commonly known as:  PENLAC Apply topically at bedtime. Apply over nail and surrounding skin. Apply daily over previous coat. After seven (7) days, may remove with alcohol and continue cycle.   enalapril 5 MG tablet Commonly known as:  VASOTEC Take 10 mg by mouth daily.   insulin aspart 100 UNIT/ML injection Commonly known as:  novoLOG Inject into the skin 3 (three) times daily before meals. 6 units before breakfast, 25 units before lunch, 44 units before dinner   levothyroxine 150 MCG tablet Commonly known as:  SYNTHROID, LEVOTHROID Take 150 mcg by mouth daily before breakfast.   metFORMIN 1000 MG tablet Commonly known as:  GLUCOPHAGE Take by mouth. 1 tab in AM. 1.5 tabs in PM.   montelukast 10 MG tablet Commonly known as:  SINGULAIR Take 10 mg by mouth at bedtime.   multivitamin tablet Take 1  tablet by mouth daily.   pantoprazole 40 MG tablet Commonly known as:  PROTONIX Take 40 mg by mouth daily.   pioglitazone 45 MG tablet Commonly known as:  ACTOS Take 45 mg by mouth daily.   simvastatin 40 MG tablet Commonly known as:  ZOCOR Take 40 mg by mouth daily.   traMADol 50 MG tablet Commonly known as:  ULTRAM Take 1-2 tablets (50-100 mg total) by mouth every 6 (six) hours as needed.   TRESIBA FLEXTOUCH 200 UNIT/ML Sopn Generic drug:  Insulin Degludec Inject 126 Units into the skin daily.            Discharge Care Instructions        Start     Ordered   10/08/16 0000  Urinalysis, Complete      10/08/16 1320   10/08/16 0000  Bladder Scan (Post Void Residual) in office     10/08/16 1320      Allergies:  Allergies  Allergen Reactions  . Crestor [Rosuvastatin]     Muscle pain  . Lipitor [Atorvastatin]     Muscle pain    Family History: Family History  Problem Relation Age of Onset  . Prostate cancer Father   . Bladder Cancer Neg Hx   . Kidney cancer Neg Hx     Social History:  reports that he has never smoked. He has never used smokeless tobacco. He reports that he does not drink alcohol. His drug history is not on file.  ROS: UROLOGY Frequent Urination?: Yes Hard to postpone urination?: Yes Burning/pain with urination?: Yes Get up at night to urinate?: Yes Leakage of urine?: Yes Urine stream starts and stops?: Yes Trouble starting stream?: Yes Do you have to strain to urinate?: Yes Blood in urine?: No Urinary tract infection?: No Sexually transmitted disease?: No Injury to kidneys or bladder?: No Painful intercourse?: No Weak stream?: Yes Erection problems?: Yes Penile pain?: No  Gastrointestinal Nausea?: No Vomiting?: No Indigestion/heartburn?: Yes Diarrhea?: Yes Constipation?: Yes  Constitutional Fever: No Night sweats?: No Weight loss?: No Fatigue?: No  Skin Skin rash/lesions?: No Itching?: No  Eyes Blurred vision?: No Double vision?: No  Ears/Nose/Throat Sore throat?: No Sinus problems?: No  Hematologic/Lymphatic Swollen glands?: No Easy bruising?: No  Cardiovascular Leg swelling?: No Chest pain?: No  Respiratory Cough?: No Shortness of breath?: No  Endocrine Excessive thirst?: Yes  Musculoskeletal Back pain?: Yes Joint pain?: No  Neurological Headaches?: No Dizziness?: No  Psychologic Depression?: No Anxiety?: No  Physical Exam: BP (!) 146/80 (BP Location: Left Arm, Patient Position: Sitting, Cuff Size: Normal)   Pulse 99   Ht 5\' 10"  (1.778 m)   Wt 230 lb (104.3 kg)   BMI 33.00 kg/m   Constitutional:   Alert and oriented, No acute distress. HEENT: [place] AT, moist mucus membranes.  Trachea midline, no masses. Cardiovascular: No clubbing, cyanosis, or edema. Respiratory: Normal respiratory effort, no increased work of breathing. GI: Abdomen is soft, nontender, nondistended, no abdominal masses GU: Male genitalia normal; limited mobility; 50 g benign prostate Skin: No rashes, bruises or suspicious lesions. Lymph: No cervical or inguinal adenopathy. Neurologic: Grossly intact, no focal deficits, moving all 4 extremities. Psychiatric: Normal mood and affect.  Laboratory Data: Lab Results  Component Value Date   WBC 12.7 (H) 08/06/2016   HGB 12.2 (L) 08/06/2016   HCT 36.1 (L) 08/06/2016   MCV 88.0 08/06/2016   PLT 193 08/06/2016    Lab Results  Component Value Date   CREATININE 1.01 08/06/2016  No results found for: PSA  No results found for: TESTOSTERONE  No results found for: HGBA1C  Urinalysis    Component Value Date/Time   COLORURINE YELLOW (A) 08/07/2016 0338   APPEARANCEUR CLEAR (A) 08/07/2016 0338   APPEARANCEUR Clear 04/09/2011 1042   LABSPEC 1.034 (H) 08/07/2016 0338   LABSPEC 1.015 04/09/2011 1042   PHURINE 6.0 08/07/2016 0338   GLUCOSEU NEGATIVE 08/07/2016 0338   GLUCOSEU Negative 04/09/2011 1042   HGBUR SMALL (A) 08/07/2016 0338   BILIRUBINUR NEGATIVE 08/07/2016 0338   BILIRUBINUR Negative 04/09/2011 1042   KETONESUR NEGATIVE 08/07/2016 0338   PROTEINUR NEGATIVE 08/07/2016 0338   NITRITE NEGATIVE 08/07/2016 0338   LEUKOCYTESUR NEGATIVE 08/07/2016 0338   LEUKOCYTESUR Negative 04/09/2011 1042    Pertinent Imaging: None  Assessment & Plan:  The patient started having moderate voiding dysfunction several weeks ago. Since his surgery he has significant small-volume frequency both day and night. He was scanned for 584 mL.  I recommended a 16 French Foley catheter with leg and night bag and instructions. I prescribed Flomax 0.4 mg. Because of his immobility  issues I thought it was better to leave the catheter in longer and give him a trial of voiding in approximately 10-14 days. Pathophysiology of retention was discussed  1. Urinary frequency 2. Incomplete bladder emptying - Urinalysis, Complete - Bladder Scan (Post Void Residual) in office   No Follow-up on file.  Reece Packer, MD  [facility] [address], [place], [place] [address] [phone]

## 2016-10-11 ENCOUNTER — Telehealth: Payer: Self-pay

## 2016-10-11 NOTE — Telephone Encounter (Signed)
Pt called with several questions concerning his foley catheter. All questions were answered in full. Pt voiced understanding of whole conversation.

## 2016-10-16 ENCOUNTER — Telehealth: Payer: Self-pay

## 2016-10-16 NOTE — Telephone Encounter (Signed)
Pt wife called stating pt has foley in place and is in extreme pain with puss at insertion site. Spoke with Larene Beach who stated penis is just aggravated from foley but if willing to learn CIC can come in to have foley removed. Spoke with pt who stated that he is willing to learn anything to get the foley out. Pt was added to nurse schedule for tomorrow to have foley removed and learn CIC.

## 2016-10-17 ENCOUNTER — Ambulatory Visit (INDEPENDENT_AMBULATORY_CARE_PROVIDER_SITE_OTHER): Payer: 59

## 2016-10-17 VITALS — BP 174/65 | HR 86 | Ht 70.0 in | Wt 230.0 lb

## 2016-10-17 DIAGNOSIS — R339 Retention of urine, unspecified: Secondary | ICD-10-CM

## 2016-10-17 NOTE — Progress Notes (Signed)
Catheter Removal  Patient is present today for a catheter removal.  36ml of water was drained from the balloon. A 16FR foley cath was removed from the bladder no complications were noted . Patient tolerated well.  Preformed by: Fonnie Jarvis, CMA  Continuous Intermittent Catheterization  Due to urinary retention patient is present today for a teaching of self I & O Catheterization. Patient and wife were given detailed verbal and printed instructions of self catheterization. Patient was cleaned and prepped in a sterile fashion.  With instruction and assistance patient's wife inserted a 14FR and urine return was noted 100 ml, urine was yellow in color. Patient tolerated well, no complications were noted Patient was given a sample bag with supplies to take home.  Instructions were given per Zara Council, [facility] for patient to cath 4 times daily.  An order was placed with [employer] for catheters to be sent to the patient's home. Patient is to follow up in 1 month. Patient is to keep a voiding dairy of residuals and call if below 124ml. If residuals are consistently below 174ml may consider cathing fewer times daily, goal of patient voiding on his own.   Preformed by: Fonnie Jarvis, CMA  Additional Notes: 30 min was spent discussing and teaching CIC with patient and his wife

## 2016-10-18 ENCOUNTER — Ambulatory Visit: Payer: Self-pay | Admitting: Urology

## 2016-10-18 ENCOUNTER — Ambulatory Visit (INDEPENDENT_AMBULATORY_CARE_PROVIDER_SITE_OTHER): Payer: 59

## 2016-10-18 ENCOUNTER — Telehealth: Payer: Self-pay | Admitting: Urology

## 2016-10-18 DIAGNOSIS — R339 Retention of urine, unspecified: Secondary | ICD-10-CM

## 2016-10-18 NOTE — Telephone Encounter (Signed)
Patient came to office, see note

## 2016-10-18 NOTE — Progress Notes (Signed)
Patient and wife present today concerned with the amount of times patient is needing to cath and some blood noted on past two catheterizations. Patient was feeling uncomfortable at the visit and asked to be cathed. Patient and wife were reassured that they were cathing correctly and some blood is ok. Original order was for cathing 4 times daily for residuals with the hope patient could urinate on his own. Patient has not been able to urinate on his own at all and is having strong urgency around every 2-3 hours including 2-3times during the night.   In and Out Catheterization  Patient is present today for a I & O catheterization due to urinary retention. Patient was cleaned and prepped in a sterile fashion with betadine and Lidocaine 2% jelly was instilled into the urethra.  A 14FR cath was inserted no complications were noted , 468ml of urine return was noted, urine was amber in color. Bladder was drained  And catheter was removed with out difficulty.    Preformed by: Fonnie Jarvis, CMA  Follow up/ Additional notes: patient will call back on Monday and update on how many times he had to cath daily over the weekend. If patient is still cathing 8-10 times daily will replace foley cath and set up an apt to see a provider

## 2016-10-18 NOTE — Telephone Encounter (Signed)
[name] Ivin Booty called and has requested that you give her a call. She said that she had some questions about the CIC training you did with them yesterday. She can be reached at [phone].  Thanks,  Sharyn Lull

## 2016-10-21 ENCOUNTER — Telehealth: Payer: Self-pay

## 2016-10-21 ENCOUNTER — Ambulatory Visit: Payer: Self-pay

## 2016-10-21 ENCOUNTER — Ambulatory Visit: Payer: 59

## 2016-10-21 NOTE — Telephone Encounter (Signed)
Spoke with patient's wife and she states patient has been doing well over the weekend he only had to cath 5-7 times daily. His residuals are under 42ml. Wife states that patient is doing well with her CIC and would like to continue does not think a foley is needed at this time.

## 2016-10-23 ENCOUNTER — Emergency Department
Admission: EM | Admit: 2016-10-23 | Discharge: 2016-10-24 | Disposition: A | Discharge status: Home / Self Care | Payer: 59 | Attending: Emergency Medicine | Admitting: Emergency Medicine

## 2016-10-23 ENCOUNTER — Encounter: Payer: Self-pay | Admitting: Emergency Medicine

## 2016-10-23 DIAGNOSIS — N390 Urinary tract infection, site not specified: Secondary | ICD-10-CM

## 2016-10-23 DIAGNOSIS — E119 Type 2 diabetes mellitus without complications: Secondary | ICD-10-CM | POA: Insufficient documentation

## 2016-10-23 DIAGNOSIS — I10 Essential (primary) hypertension: Secondary | ICD-10-CM | POA: Insufficient documentation

## 2016-10-23 DIAGNOSIS — Z7982 Long term (current) use of aspirin: Secondary | ICD-10-CM | POA: Diagnosis not present

## 2016-10-23 DIAGNOSIS — Z794 Long term (current) use of insulin: Secondary | ICD-10-CM | POA: Insufficient documentation

## 2016-10-23 DIAGNOSIS — K5900 Constipation, unspecified: Secondary | ICD-10-CM | POA: Diagnosis not present

## 2016-10-23 DIAGNOSIS — E039 Hypothyroidism, unspecified: Secondary | ICD-10-CM | POA: Insufficient documentation

## 2016-10-23 DIAGNOSIS — R339 Retention of urine, unspecified: Secondary | ICD-10-CM

## 2016-10-23 DIAGNOSIS — Z79899 Other long term (current) drug therapy: Secondary | ICD-10-CM | POA: Insufficient documentation

## 2016-10-23 NOTE — ED Triage Notes (Signed)
Pt presents to ED with difficulty urinating. Pt has been using an in and out catheter at home approx 5-7 times a day since Friday after a failed indwelling catheter placed by his surgeon after a crushed arm repair at [facility] following a traumatic fall from a ladder mid sept. Pt wife states "his kidneys have not woken up yet since the surgery". have been speaking with urology since then to monitor his kidney function daily. Pt last self catheterized around 1830 after some difficulty and was able to get out 456ml of  urine with some blood and clots present. pt states it was very painful. Pt states he now feels like his bladder is going to explode. Unable to void more than a drop or two.

## 2016-10-24 ENCOUNTER — Emergency Department: Payer: 59

## 2016-10-24 LAB — CBC WITH DIFFERENTIAL/PLATELET
Basophils Absolute: 0.1 10*3/uL (ref 0–0.1)
Basophils Relative: 1 %
Eosinophils Absolute: 0.3 10*3/uL (ref 0–0.7)
Eosinophils Relative: 3 %
HEMATOCRIT: 28.3 % — AB (ref 40.0–52.0)
HEMOGLOBIN: 9.6 g/dL — AB (ref 13.0–18.0)
LYMPHS ABS: 1.9 10*3/uL (ref 1.0–3.6)
LYMPHS PCT: 19 %
MCH: 30.1 pg (ref 26.0–34.0)
MCHC: 34.1 g/dL (ref 32.0–36.0)
MCV: 88.2 fL (ref 80.0–100.0)
MONOS PCT: 8 %
Monocytes Absolute: 0.8 10*3/uL (ref 0.2–1.0)
NEUTROS PCT: 69 %
Neutro Abs: 7 10*3/uL — ABNORMAL HIGH (ref 1.4–6.5)
Platelets: 332 10*3/uL (ref 150–440)
RBC: 3.21 MIL/uL — AB (ref 4.40–5.90)
RDW: 14.3 % (ref 11.5–14.5)
WBC: 10.1 10*3/uL (ref 3.8–10.6)

## 2016-10-24 LAB — BASIC METABOLIC PANEL
Anion gap: 8 (ref 5–15)
BUN: 14 mg/dL (ref 6–20)
CHLORIDE: 104 mmol/L (ref 101–111)
CO2: 27 mmol/L (ref 22–32)
CREATININE: 0.8 mg/dL (ref 0.61–1.24)
Calcium: 8.7 mg/dL — ABNORMAL LOW (ref 8.9–10.3)
GFR calc Af Amer: >60 mL/min (ref >60)
GFR calc non Af Amer: >60 mL/min (ref >60)
Glucose, Bld: 158 mg/dL — ABNORMAL HIGH (ref 65–99)
POTASSIUM: 3.9 mmol/L (ref 3.5–5.1)
Sodium: 139 mmol/L (ref 135–145)

## 2016-10-24 LAB — URINALYSIS, COMPLETE (UACMP) WITH MICROSCOPIC
BACTERIA UA: NONE SEEN
BILIRUBIN URINE: NEGATIVE
Glucose, UA: 50 mg/dL — AB
Ketones, ur: NEGATIVE mg/dL
NITRITE: NEGATIVE
PH: 7 (ref 5.0–8.0)
Protein, ur: 30 mg/dL — AB
SPECIFIC GRAVITY, URINE: 1.006 (ref 1.005–1.030)

## 2016-10-24 MED ORDER — OXYBUTYNIN CHLORIDE 5 MG PO TABS: 5.0000 mg | ORAL_TABLET | Freq: Three times a day (TID) | ORAL | 0 refills | Status: DC | PRN

## 2016-10-24 MED ORDER — LACTULOSE 10 GM/15ML PO SOLN: 20.0000 g | Freq: Every day | ORAL | 0 refills | Status: DC | PRN

## 2016-10-24 MED ORDER — LIDOCAINE HCL 2 % EX GEL
CUTANEOUS | Status: AC
  Filled 2016-10-24: qty 10

## 2016-10-24 MED ORDER — LIDOCAINE HCL 2 % EX GEL
CUTANEOUS | Status: AC
  Administered 2016-10-24: 03:00:00
  Filled 2016-10-24: qty 10

## 2016-10-24 MED ORDER — CIPROFLOXACIN HCL 500 MG PO TABS: 500.0000 mg | ORAL_TABLET | Freq: Two times a day (BID) | ORAL | 0 refills | Status: DC

## 2016-10-24 MED ORDER — CIPROFLOXACIN HCL 500 MG PO TABS
500.0000 mg | ORAL_TABLET | Freq: Once | ORAL | Status: AC
  Administered 2016-10-24: 500 mg via ORAL
  Filled 2016-10-24: qty 1

## 2016-10-24 MED ORDER — OXYBUTYNIN CHLORIDE 5 MG PO TABS
5.0000 mg | ORAL_TABLET | Freq: Once | ORAL | Status: AC
  Administered 2016-10-24: 5 mg via ORAL
  Filled 2016-10-24: qty 1

## 2016-10-24 NOTE — Telephone Encounter (Signed)
[name], Otilio Saber, called the office today at 10:25am and left a voice mail message requesting a call back from Fonnie Jarvis.  She can be reached at [phone].

## 2016-10-24 NOTE — Discharge Instructions (Signed)
1. Take antibiotic as prescribed (Cipro 500 mg twice daily 7 days). 2. You may take Ditropan 3 times daily as needed for bladder spasms. 3. Take lactulose as needed for bowel movements. 4. Urine culture is pending. You will be notified of any positive results or if we need to switch your antibiotic. 5. Return to the ER for worsening symptoms, persistent vomiting, fever or other concerns.

## 2016-10-24 NOTE — ED Provider Notes (Signed)
[facility] Emergency Department Provider Note   ____________________________________________   First MD Initiated Contact with Patient 10/24/16 0010     (approximate)  I have reviewed the triage vital signs and the nursing notes.   HISTORY  Chief Complaint Urinary Retention    HPI Nicholas Humphrey is a 66 y.o. male who presents to the ED from home with a chief complaint of urinary retention. Patient had an indwelling Foley catheter for approximately 9 days after left arm surgery at [facility]. He was seen at the urologist's office last Friday because he could not tolerate the pain and spasms from the Foley catheter. Catheter was removed and patient has been performing in and out catheter approximately 5-7 times per day since that time. States everything was fine until yesterday when his wife began to have more trouble inserting the catheter to obtain urine. Last couple of times they have noted blood clots. Wife last catheterized him approximately 6:30 PM. He is able to dribble a little urine and if he sits on the toilet, but has increased recently felt retention over the course of the evening. Has been constipated; had a small, hard BM yesterday. Denies associated fever, chills, chest pain, shortness of breath, nausea, vomiting. Denies recent travel or trauma. Nothing makes his symptoms better or worse.Takes baby aspirin daily.   Past Medical History:  Diagnosis Date  . Diabetes mellitus without complication ([place])   . Diabetic neuropathy ([place])   . GERD (gastroesophageal reflux disease)   . Hypercholesteremia   . Hypertension   . Hypothyroidism   . PONV (postoperative nausea and vomiting)   . Wears contact lenses     There are no active problems to display for this patient.   Past Surgical History:  Procedure Laterality Date  . APPENDECTOMY    . INGUINAL HERNIA REPAIR Right   . KNEE ARTHROSCOPY W/ PARTIAL MEDIAL MENISCECTOMY Left 08/17/2010  . LUMBAR  LAMINECTOMY    . ROTATOR CUFF REPAIR Right 2013  . TRIGGER FINGER RELEASE Right 10/04/2015   Procedure: RELEASE TRIGGER FINGER/A-1 PULLEY;  Surgeon: Corky Mull, MD;  Location: [facility];  Service: Orthopedics;  Laterality: Right;  PREFER BIER BLOCK Diabetic - insulin and oral meds  . TYMPANOPLASTY Right     Prior to Admission medications   Medication Sig Start Date End Date Taking? Authorizing Provider  aspirin 81 MG tablet Take 81 mg by mouth daily.    [provider]  ciclopirox (PENLAC) 8 % solution Apply topically at bedtime. Apply over nail and surrounding skin. Apply daily over previous coat. After seven (7) days, may remove with alcohol and continue cycle.    [provider]  ciprofloxacin (CIPRO) 500 MG tablet Take 1 tablet (500 mg total) by mouth 2 (two) times daily. 10/24/16   [name], MD  enalapril (VASOTEC) 5 MG tablet Take 10 mg by mouth daily.    [provider]  insulin aspart (NOVOLOG) 100 UNIT/ML injection Inject into the skin 3 (three) times daily before meals. 6 units before breakfast, 25 units before lunch, 44 units before dinner    [provider]  Insulin Degludec (TRESIBA FLEXTOUCH) 200 UNIT/ML SOPN Inject 126 Units into the skin daily.    [provider]  lactulose (CHRONULAC) 10 GM/15ML solution Take 30 mLs (20 g total) by mouth daily as needed for mild constipation. 10/24/16   [name], MD  levothyroxine (SYNTHROID, LEVOTHROID) 150 MCG tablet Take 150 mcg by mouth daily  before breakfast.    [provider]  metFORMIN (GLUCOPHAGE) 1000 MG tablet Take by mouth. 1 tab in AM. 1.5 tabs in PM.    [provider]  montelukast (SINGULAIR) 10 MG tablet Take 10 mg by mouth at bedtime.    [provider]  Multiple Vitamin (MULTIVITAMIN) tablet Take 1 tablet by mouth daily.    [provider]  oxybutynin (DITROPAN) 5 MG tablet Take 1 tablet (5 mg total) by mouth every 8 (eight) hours  as needed for bladder spasms. 10/24/16   [name], MD  pantoprazole (PROTONIX) 40 MG tablet Take 40 mg by mouth daily.    [provider]  pioglitazone (ACTOS) 45 MG tablet Take 45 mg by mouth daily.    [provider]  simvastatin (ZOCOR) 40 MG tablet Take 40 mg by mouth daily.    [provider]  tamsulosin (FLOMAX) 0.4 MG CAPS capsule Take 1 capsule (0.4 mg total) by mouth daily. 10/08/16   Bjorn Loser, MD  traMADol (ULTRAM) 50 MG tablet Take 1-2 tablets (50-100 mg total) by mouth every 6 (six) hours as needed. Patient not taking: Reported on 10/08/2016 10/04/15   Poggi, Marshall Cork, MD    Allergies Crestor [rosuvastatin] and Lipitor [atorvastatin]  Family History  Problem Relation Age of Onset  . Prostate cancer Father   . Bladder Cancer Neg Hx   . Kidney cancer Neg Hx     Social History Social History  Substance Use Topics  . Smoking status: Never Smoker  . Smokeless tobacco: Never Used  . Alcohol use No    Review of Systems  Constitutional: No fever/chills. Eyes: No visual changes. ENT: No sore throat. Cardiovascular: Denies chest pain. Respiratory: Denies shortness of breath. Gastrointestinal: No abdominal pain.  No nausea, no vomiting.  No diarrhea.  No constipation. Genitourinary: Positive for urinary retention. Musculoskeletal: Negative for back pain. Skin: Negative for rash. Neurological: Negative for headaches, focal weakness or numbness.   ____________________________________________   PHYSICAL EXAM:  VITAL SIGNS: ED Triage Vitals  Enc Vitals Group     BP 10/23/16 2129 (!) 145/59     Pulse Rate 10/23/16 2129 95     Resp 10/23/16 2129 20     Temp 10/23/16 2129 97.7 F (36.5 C)     Temp Source 10/23/16 2129 Oral     SpO2 10/23/16 2129 99 %     Weight 10/23/16 2130 230 lb (104.3 kg)     Height 10/23/16 2130 5\' 10"  (1.778 m)     Head Circumference --      Peak Flow --      Pain Score 10/23/16 2129 10     Pain Loc --       Pain Edu? --      Excl. in [place]? --     Constitutional: Alert and oriented. Well appearing and in mild acute distress. Eyes: Conjunctivae are normal. PERRL. EOMI. Head: Atraumatic. Nose: No congestion/rhinnorhea. Mouth/Throat: Mucous membranes are moist.  Oropharynx non-erythematous. Neck: No stridor.   Cardiovascular: Normal rate, regular rhythm. Grossly normal heart sounds.  Good peripheral circulation. Respiratory: Normal respiratory effort.  No retractions. Lungs CTAB. Gastrointestinal: Soft and nontender. No distention. No abdominal bruits. No CVA tenderness. Genitourinary: Normal male genitalia. Musculoskeletal: No lower extremity tenderness nor edema.  No joint effusions. Neurologic:  Normal speech and language. No gross focal neurologic deficits are appreciated. Ambulates with cane. Skin:  Skin is warm, dry and intact. No rash noted. Psychiatric: Mood and affect  are normal. Speech and behavior are normal.  ____________________________________________   LABS (all labs ordered are listed, but only abnormal results are displayed)  Labs Reviewed  URINALYSIS, COMPLETE (UACMP) WITH MICROSCOPIC - Abnormal; Notable for the following:       Result Value   Color, Urine YELLOW (*)    APPearance CLEAR (*)    Glucose, UA 50 (*)    Hgb urine dipstick SMALL (*)    Protein, ur 30 (*)    Leukocytes, UA TRACE (*)    Squamous Epithelial / LPF 0-5 (*)    All other components within normal limits  CBC WITH DIFFERENTIAL/PLATELET - Abnormal; Notable for the following:    RBC 3.21 (*)    Hemoglobin 9.6 (*)    HCT 28.3 (*)    Neutro Abs 7.0 (*)    All other components within normal limits  BASIC METABOLIC PANEL - Abnormal; Notable for the following:    Glucose, Bld 158 (*)    Calcium 8.7 (*)    All other components within normal limits  URINE CULTURE   ____________________________________________  EKG  None ____________________________________________  RADIOLOGY  Dg Abdomen 1  View  Result Date: 10/24/2016 CLINICAL DATA:  Urinary retention.  Assess for constipation. EXAM: ABDOMEN - 1 VIEW COMPARISON:  CT abdomen and pelvis August 07, 2016 FINDINGS: Moderate amount of retained large bowel stool. Paucity of bowel gas. No intra-abdominal mass effect. Seminal vesicle calcifications associated with diabetes. Bilateral inguinal hernia repairs. Soft tissue planes and included osseous structures are nonsuspicious. IMPRESSION: Moderate retained large bowel stool, nonspecific bowel gas pattern. Electronically Signed   By: Elon Alas M.D.   On: 10/24/2016 01:32    ____________________________________________   PROCEDURES  Procedure(s) performed: None  Procedures  Critical Care performed: No  ____________________________________________   INITIAL IMPRESSION / ASSESSMENT AND PLAN / ED COURSE    66 year old male who presents with urinary retention in the setting of recent Foley catheter removal and daily in and out catheter for urine 6 days (patient's preference as opposed to reinserting Foley catheter). Will check screening lab work, urinalysis. Bladder scan in triage reveals 389 mL. Long discussion with patient and spouse regarding risk/benefits of reinserting Foley catheter. At this point, patient is agreeable for Foley catheter placement.  Clinical Course as of Oct 25 610  Thu Oct 24, 2016  0301 Patient feels much improved after Foley placement. Clear yellow urine in Foley bag; no gross hematuria or blood clots. Updated patient and spouse of laboratory imaging results. Urine culture is pending. Will start empiric antibiotic; discharge home on ditropan and and lactulose. Patient will follow-up with urology. Strict return precautions given. Both verbalize understanding and agree with plan of care.  [JS]    Clinical Course User Index [JS] [name], MD     ____________________________________________   FINAL CLINICAL IMPRESSION(S) / ED DIAGNOSES  Final  diagnoses:  Urinary retention  Lower urinary tract infectious disease  Constipation, unspecified constipation type      NEW MEDICATIONS STARTED DURING THIS VISIT:  Discharge Medication List as of 10/24/2016  3:15 AM    START taking these medications   Details  ciprofloxacin (CIPRO) 500 MG tablet Take 1 tablet (500 mg total) by mouth 2 (two) times daily., Starting Thu 10/24/2016, Print    lactulose (CHRONULAC) 10 GM/15ML solution Take 30 mLs (20 g total) by mouth daily as needed for mild constipation., Starting Thu 10/24/2016, Print    oxybutynin (DITROPAN) 5 MG tablet Take 1 tablet (5 mg  total) by mouth every 8 (eight) hours as needed for bladder spasms., Starting Thu 10/24/2016, Print         Note:  This document was prepared using Dragon voice recognition software and may include unintentional dictation errors.    [name], MD 10/24/16 [phone]

## 2016-10-24 NOTE — ED Notes (Signed)
Leg bag applied

## 2016-10-24 NOTE — Telephone Encounter (Signed)
[name] called stating that patient had to have foley cath replaced in ER due to patient's wife not being able to pass catheter. Patient would like to keep foley until next follow up after his upcoming surgery.

## 2016-10-25 LAB — URINE CULTURE: Culture: NO GROWTH

## 2016-11-13 ENCOUNTER — Ambulatory Visit (INDEPENDENT_AMBULATORY_CARE_PROVIDER_SITE_OTHER): Payer: 59 | Admitting: Urology

## 2016-11-13 VITALS — BP 165/78 | HR 99 | Ht 70.0 in | Wt 245.8 lb

## 2016-11-13 DIAGNOSIS — R339 Retention of urine, unspecified: Secondary | ICD-10-CM

## 2016-11-13 MED ORDER — LIDOCAINE HCL 2 % EX GEL
1.0000 "application " | Freq: Once | CUTANEOUS | Status: AC
  Administered 2016-11-13: 1 via URETHRAL

## 2016-11-13 MED ORDER — CIPROFLOXACIN HCL 500 MG PO TABS
500.0000 mg | ORAL_TABLET | Freq: Once | ORAL | Status: AC
  Administered 2016-11-13: 500 mg via ORAL

## 2016-11-13 NOTE — Progress Notes (Signed)
11/13/2016 9:35 AM   Nicholas Humphrey 06-05-1950 [id number]  Referring provider: Sallee Lange, NP [address] [place], [place] [address]  Chief Complaint  Patient presents with  . Cysto    HPI: The patient was acutely added on to clinic today with small-volume frequency. He fell last Thursday from a ladder and has extensive left arm wrist and elbow injuries requiring surgery. He may need more surgery. He bruises left leg and hip but had no pelvic fracture  He was voiding well until about a month ago when he started having a slower flow with hourly nocturia. He was voiding only 4 or 5 times a day. Since his surgery he's been voiding every 15 minutes and even 18 times last night. He has had constipation but this is improving in the last day or so. He was not placed on Flomax  I reviewed the medical record and a recent CT scan demonstrated normal kidneys and an empty bladder. No urine cultures have been sent. PVR 584. Flomax started  Today Foley initially not tolerated; on CIC - went ot ER with trouble to cath and foley placed Now can tolerate catheter Cysto: penile and bulbar urethra normal; moderate bilobar enlargement and 2.5 cm urethra; ? 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 Diabetes mellitus without complication ([place])   . Diabetic neuropathy ([place])   . GERD (gastroesophageal reflux disease)   . Hypercholesteremia   . Hypertension   . Hypothyroidism   . PONV (postoperative nausea and vomiting)   . Wears contact lenses     Surgical History: Past Surgical History:  Procedure Laterality Date  . APPENDECTOMY    . INGUINAL HERNIA REPAIR Right   . KNEE ARTHROSCOPY W/ PARTIAL MEDIAL MENISCECTOMY Left 08/17/2010  . LUMBAR LAMINECTOMY    . ROTATOR CUFF REPAIR Right 2013  . TRIGGER FINGER RELEASE Right 10/04/2015   Procedure: RELEASE TRIGGER FINGER/A-1  PULLEY;  Surgeon: Corky Mull, MD;  Location: [facility];  Service: Orthopedics;  Laterality: Right;  PREFER BIER BLOCK Diabetic - insulin and oral meds  . TYMPANOPLASTY Right     Home Medications:  Allergies as of 11/13/2016      Reactions   Crestor [rosuvastatin]    Muscle pain   Lipitor [atorvastatin]    Muscle pain      Medication List       Accurate as of 11/13/16  9:35 AM. Always use your most recent med list.          aspirin 81 MG tablet Take 81 mg by mouth daily.   ciclopirox 8 % solution Commonly known as:  PENLAC Apply topically at bedtime. Apply over nail and surrounding skin. Apply daily over previous coat. After seven (7) days, may remove with alcohol and continue cycle.   enalapril 5 MG tablet Commonly known as:  VASOTEC Take 10 mg by mouth daily.   insulin aspart 100 UNIT/ML injection Commonly known as:  novoLOG Inject into the skin 3 (three) times daily before meals. 6 units before breakfast, 25 units before lunch, 44 units before dinner   lactulose 10 GM/15ML solution Commonly known as:  CHRONULAC Take 30 mLs (20 g total) by mouth daily as needed for mild constipation.   levothyroxine 150 MCG tablet Commonly known as:  SYNTHROID, LEVOTHROID Take 150 mcg by mouth daily before breakfast.   metFORMIN 1000 MG  tablet Commonly known as:  GLUCOPHAGE Take by mouth. 1 tab in AM. 1.5 tabs in PM.   montelukast 10 MG tablet Commonly known as:  SINGULAIR Take 10 mg by mouth at bedtime.   multivitamin tablet Take 1 tablet by mouth daily.   oxybutynin 5 MG tablet Commonly known as:  DITROPAN Take 1 tablet (5 mg total) by mouth every 8 (eight) hours as needed for bladder spasms.   pantoprazole 40 MG tablet Commonly known as:  PROTONIX Take 40 mg by mouth daily.   pioglitazone 45 MG tablet Commonly known as:  ACTOS Take 45 mg by mouth daily.   simvastatin 40 MG tablet Commonly known as:  ZOCOR Take 40 mg by mouth daily.   tamsulosin  0.4 MG Caps capsule Commonly known as:  FLOMAX Take 1 capsule (0.4 mg total) by mouth daily.   traMADol 50 MG tablet Commonly known as:  ULTRAM Take 1-2 tablets (50-100 mg total) by mouth every 6 (six) hours as needed.   TRESIBA FLEXTOUCH 200 UNIT/ML Sopn Generic drug:  Insulin Degludec Inject 126 Units into the skin daily.       Allergies:  Allergies  Allergen Reactions  . Crestor [Rosuvastatin]     Muscle pain  . Lipitor [Atorvastatin]     Muscle pain    Family History: Family History  Problem Relation Age of Onset  . Prostate cancer Father   . Bladder Cancer Neg Hx   . Kidney cancer Neg Hx     Social History:  reports that he has never smoked. He has never used smokeless tobacco. He reports that he does not drink alcohol or use drugs.  ROS:                                        Physical Exam: BP (!) 165/78 (BP Location: Right Arm, Patient Position: Sitting, Cuff Size: Large)   Pulse 99   Ht 5\' 10"  (1.778 m)   Wt 245 lb 12.8 oz (111.5 kg)   BMI 35.27 kg/m   Constitutional:  Alert and oriented, No acute distress.  Laboratory Data: Lab Results  Component Value Date   WBC 10.1 10/24/2016   HGB 9.6 (L) 10/24/2016   HCT 28.3 (L) 10/24/2016   MCV 88.2 10/24/2016   PLT 332 10/24/2016    Lab Results  Component Value Date   CREATININE 0.80 10/24/2016    No results found for: PSA  No results found for: TESTOSTERONE  No results found for: HGBA1C  Urinalysis    Component Value Date/Time   COLORURINE YELLOW (A) 10/24/2016 0230   APPEARANCEUR CLEAR (A) 10/24/2016 0230   APPEARANCEUR Clear 10/08/2016 1343   LABSPEC 1.006 10/24/2016 0230   LABSPEC 1.015 04/09/2011 1042   PHURINE 7.0 10/24/2016 0230   GLUCOSEU 50 (A) 10/24/2016 0230   GLUCOSEU Negative 04/09/2011 1042   HGBUR SMALL (A) 10/24/2016 0230   BILIRUBINUR NEGATIVE 10/24/2016 0230   BILIRUBINUR Negative 10/08/2016 1343   BILIRUBINUR Negative 04/09/2011 1042    KETONESUR NEGATIVE 10/24/2016 0230   PROTEINUR 30 (A) 10/24/2016 0230   NITRITE NEGATIVE 10/24/2016 0230   LEUKOCYTESUR TRACE (A) 10/24/2016 0230   LEUKOCYTESUR Negative 10/08/2016 1343   LEUKOCYTESUR Negative 04/09/2011 1042    Pertinent Imaging: Reviewed again  Assessment & Plan:  Retention; needs more surgery Nov 15; foley moderate term management strategy; finding in bladder ? TCC vs. Foley changes; nonsmoker;  will need trial of void and repeat cysto in future  I was finally able to get dragon to work.  The patient had an MRI of the hip was told he may have a nodule on his prostate.  I cannot find the documents.  I did a digital rectal examination and he had a 50 g benign prostate.  Trial of voiding in about 1 month.  Eventually will need repeat cystoscopy.  Management of retention depends on progress  1. Urinary retention  - Urinalysis, Complete - ciprofloxacin (CIPRO) tablet 500 mg; Take 1 tablet (500 mg total) by mouth once. - lidocaine (XYLOCAINE) 2 % jelly 1 application; Place 1 application into the urethra once.   No Follow-up on file.  Reece Packer, MD  [facility] [address] [place], [place] [address] [phone]

## 2016-11-13 NOTE — Addendum Note (Signed)
Addended by: Kyra Manges on: 11/13/2016 10:10 AM   Modules accepted: Orders

## 2016-11-17 LAB — CULTURE, URINE COMPREHENSIVE

## 2016-11-18 ENCOUNTER — Ambulatory Visit: Payer: 59

## 2016-11-29 ENCOUNTER — Telehealth: Payer: Self-pay

## 2016-11-29 MED ORDER — NITROFURANTOIN MONOHYD MACRO 100 MG PO CAPS: 100.0000 mg | ORAL_CAPSULE | Freq: Two times a day (BID) | ORAL | 0 refills | Status: DC

## 2016-11-29 NOTE — Telephone Encounter (Signed)
Patient notified on voicemail and script sent to pharm

## 2016-11-29 NOTE — Telephone Encounter (Signed)
-----   Message from Noralyn Pick sent at 11/28/2016  3:20 PM EST -----   ----- Message ----- From: Bjorn Loser, MD Sent: 11/18/2016   1:50 PM To: Orlene Erm, CMA  Macrodantin 100 mg bid for 7 days   ----- Message ----- From: Orlene Erm, CMA Sent: 11/18/2016   9:12 AM To: Bjorn Loser, MD    ----- Message ----- From: Interface, [facility] Lab Results In Sent: 11/15/2016   9:41 AM To: Rowe Robert Clinical

## 2016-12-05 ENCOUNTER — Emergency Department
Admission: EM | Admit: 2016-12-05 | Discharge: 2016-12-05 | Disposition: A | Discharge status: Home / Self Care | Payer: 59 | Attending: Emergency Medicine | Admitting: Emergency Medicine

## 2016-12-05 DIAGNOSIS — Z7982 Long term (current) use of aspirin: Secondary | ICD-10-CM | POA: Insufficient documentation

## 2016-12-05 DIAGNOSIS — E1140 Type 2 diabetes mellitus with diabetic neuropathy, unspecified: Secondary | ICD-10-CM | POA: Diagnosis not present

## 2016-12-05 DIAGNOSIS — Y732 Prosthetic and other implants, materials and accessory gastroenterology and urology devices associated with adverse incidents: Secondary | ICD-10-CM | POA: Diagnosis not present

## 2016-12-05 DIAGNOSIS — Z794 Long term (current) use of insulin: Secondary | ICD-10-CM | POA: Diagnosis not present

## 2016-12-05 DIAGNOSIS — T83021A Displacement of indwelling urethral catheter, initial encounter: Secondary | ICD-10-CM | POA: Diagnosis present

## 2016-12-05 DIAGNOSIS — R319 Hematuria, unspecified: Secondary | ICD-10-CM

## 2016-12-05 DIAGNOSIS — E039 Hypothyroidism, unspecified: Secondary | ICD-10-CM | POA: Diagnosis not present

## 2016-12-05 DIAGNOSIS — I10 Essential (primary) hypertension: Secondary | ICD-10-CM | POA: Diagnosis not present

## 2016-12-05 MED ORDER — LIDOCAINE HCL 2 % EX GEL
1.0000 "application " | Freq: Once | CUTANEOUS | Status: AC
  Administered 2016-12-05: 1 via URETHRAL
  Filled 2016-12-05: qty 5

## 2016-12-05 NOTE — ED Notes (Signed)
Bladder scan for 20 cc

## 2016-12-05 NOTE — ED Triage Notes (Signed)
Pt has a permanent indwelling cathter for dysuria. Pt has been self cathing for about 2 weeks and now had an indwelling cath in place for about 2 weeks. He states the bag was leaking and had a hole in it and they removed it with the balloon still in place

## 2016-12-05 NOTE — ED Provider Notes (Signed)
[facility] Emergency Department Provider Note    First MD Initiated Contact with Patient 12/05/16 [phone]     (approximate)  I have reviewed the triage vital signs and the nursing notes.   HISTORY  Chief Complaint Hematuria    HPI Nicholas Humphrey is a 66 y.o. male with below list of chronic medical conditions including indwelling Foley catheter secondary to urinary retention presents to the emergency department stating that his wife remove the Foley catheter tonight because it was "leaking around the catheter".  [name] states that she was not aware that she had to deflate the balloon before removal.  Patient and spouse states that after removing the catheter considerable pain and bleeding noted from the penis.  Patient states current pain score 3 out of 10.   Past Medical History:  Diagnosis Date  . Diabetes mellitus without complication ([place])   . Diabetic neuropathy ([place])   . GERD (gastroesophageal reflux disease)   . Hypercholesteremia   . Hypertension   . Hypothyroidism   . PONV (postoperative nausea and vomiting)   . Wears contact lenses     There are no active problems to display for this patient.   Past Surgical History:  Procedure Laterality Date  . APPENDECTOMY    . INGUINAL HERNIA REPAIR Right   . KNEE ARTHROSCOPY W/ PARTIAL MEDIAL MENISCECTOMY Left 08/17/2010  . LUMBAR LAMINECTOMY    . ROTATOR CUFF REPAIR Right 2013  . TRIGGER FINGER RELEASE Right 10/04/2015   Procedure: RELEASE TRIGGER FINGER/A-1 PULLEY;  Surgeon: Corky Mull, MD;  Location: [place];  Service: Orthopedics;  Laterality: Right;  PREFER BIER BLOCK Diabetic - insulin and oral meds  . TYMPANOPLASTY Right     Prior to Admission medications   Medication Sig Start Date End Date Taking? Authorizing Provider  aspirin 81 MG tablet Take 81 mg by mouth daily.    [provider]  ciclopirox (PENLAC) 8 % solution Apply topically at bedtime. Apply  over nail and surrounding skin. Apply daily over previous coat. After seven (7) days, may remove with alcohol and continue cycle.    [provider]  enalapril (VASOTEC) 5 MG tablet Take 10 mg by mouth daily.    [provider]  insulin aspart (NOVOLOG) 100 UNIT/ML injection Inject into the skin 3 (three) times daily before meals. 6 units before breakfast, 25 units before lunch, 44 units before dinner    [provider]  Insulin Degludec (TRESIBA FLEXTOUCH) 200 UNIT/ML SOPN Inject 126 Units into the skin daily.    [provider]  lactulose (CHRONULAC) 10 GM/15ML solution Take 30 mLs (20 g total) by mouth daily as needed for mild constipation. 10/24/16   [name], MD  levothyroxine (SYNTHROID, LEVOTHROID) 150 MCG tablet Take 150 mcg by mouth daily before breakfast.    [provider]  metFORMIN (GLUCOPHAGE) 1000 MG tablet Take by mouth. 1 tab in AM. 1.5 tabs in PM.    [provider]  montelukast (SINGULAIR) 10 MG tablet Take 10 mg by mouth at bedtime.    [provider]  Multiple Vitamin (MULTIVITAMIN) tablet Take 1 tablet by mouth daily.    [provider]  nitrofurantoin, macrocrystal-monohydrate, (MACROBID) 100 MG capsule Take 1 capsule (100 mg total) every 12 (twelve) hours by mouth. 11/29/16   Bjorn Loser, MD  oxybutynin (DITROPAN) 5 MG tablet Take 1 tablet (5 mg total) by mouth every 8 (eight) hours as needed for bladder spasms. 10/24/16  [name], MD  pantoprazole (PROTONIX) 40 MG tablet Take 40 mg by mouth daily.    [provider]  pioglitazone (ACTOS) 45 MG tablet Take 45 mg by mouth daily.    [provider]  simvastatin (ZOCOR) 40 MG tablet Take 40 mg by mouth daily.    [provider]  tamsulosin (FLOMAX) 0.4 MG CAPS capsule Take 1 capsule (0.4 mg total) by mouth daily. 10/08/16   Bjorn Loser, MD  traMADol (ULTRAM) 50 MG tablet Take 1-2 tablets (50-100 mg total) by mouth  every 6 (six) hours as needed. 10/04/15   Poggi, Marshall Cork, MD    Allergies Crestor [rosuvastatin] and Lipitor [atorvastatin]  Family History  Problem Relation Age of Onset  . Prostate cancer Father   . Bladder Cancer Neg Hx   . Kidney cancer Neg Hx     Social History Social History   Tobacco Use  . Smoking status: Never Smoker  . Smokeless tobacco: Never Used  Substance Use Topics  . Alcohol use: No  . Drug use: No    Review of Systems Constitutional: No fever/chills Eyes: No visual changes. ENT: No sore throat. Cardiovascular: Denies chest pain. Respiratory: Denies shortness of breath. Gastrointestinal: No abdominal pain.  No nausea, no vomiting.  No diarrhea.  No constipation. Genitourinary: Negative for dysuria.  Positive for hematuria and penile discomfort Musculoskeletal: Negative for neck pain.  Negative for back pain. Integumentary: Negative for rash. Neurological: Negative for headaches, focal weakness or numbness.   ____________________________________________   PHYSICAL EXAM:  VITAL SIGNS: ED Triage Vitals [12/05/16 0239]  Enc Vitals Group     BP (!) 177/85     Pulse Rate 98     Resp 16     Temp 97.7 F (36.5 C)     Temp Source Oral     SpO2 100 %     Weight      Height      Head Circumference      Peak Flow      Pain Score      Pain Loc      Pain Edu?      Excl. in [place]?     Constitutional: Alert and oriented. Well appearing and in no acute distress. Eyes: Conjunctivae are normal.  Head: Atraumatic. Mouth/Throat: Mucous membranes are moist. Neck: No stridor.   Cardiovascular: Normal rate, regular rhythm. Good peripheral circulation. Grossly normal heart sounds. Respiratory: Normal respiratory effort.  No retractions. Lungs CTAB. Gastrointestinal: Soft and nontender. No distention.  Genitourinary: Blood noted at the penile urethral os Musculoskeletal: No lower extremity tenderness nor edema. No gross deformities of extremities. Neurologic:   Normal speech and language. No gross focal neurologic deficits are appreciated.  Skin:  Skin is warm, dry and intact. No rash noted. Psychiatric: Mood and affect are normal. Speech and behavior are normal.    Procedures   ____________________________________________   INITIAL IMPRESSION / ASSESSMENT AND PLAN / ED COURSE  As part of my medical decision making, I reviewed the following data within the electronic MEDICAL RECORD [id number] year old male present with above-stated history and physical exam of traumatic Foley catheter removal at home.  Foley catheter placed within the emergency department without difficulty.  Patient without discomfort at this time.  Patient advised to follow-up with urologist as planned     ____________________________________________  FINAL CLINICAL IMPRESSION(S) / ED DIAGNOSES  Final diagnoses:  Hematuria, unspecified type  Dislodged Foley catheter, initial encounter [facility])     MEDICATIONS GIVEN  DURING THIS VISIT:  Medications  lidocaine (XYLOCAINE) 2 % jelly 1 application (1 application Urethral Given 12/05/16 0238)     ED Discharge Orders    None       Note:  This document was prepared using Dragon voice recognition software and may include unintentional dictation errors.    Gregor Hams, MD 12/05/16 [phone]

## 2016-12-06 ENCOUNTER — Ambulatory Visit: Payer: 59

## 2016-12-06 VITALS — BP 160/77 | HR 82 | Ht 70.0 in | Wt 236.9 lb

## 2016-12-06 DIAGNOSIS — R339 Retention of urine, unspecified: Secondary | ICD-10-CM

## 2016-12-16 ENCOUNTER — Ambulatory Visit (INDEPENDENT_AMBULATORY_CARE_PROVIDER_SITE_OTHER): Payer: 59 | Admitting: Urology

## 2016-12-16 VITALS — BP 169/75 | HR 82 | Ht 70.0 in | Wt 241.6 lb

## 2016-12-16 DIAGNOSIS — R339 Retention of urine, unspecified: Secondary | ICD-10-CM | POA: Diagnosis not present

## 2016-12-16 LAB — BLADDER SCAN AMB NON-IMAGING: Scan Result: 0

## 2016-12-16 NOTE — Progress Notes (Signed)
12/16/2016 8:41 AM   Nicholas Humphrey 1950-05-20 [id number]  Referring provider: Sallee Lange, NP [address] [place], [place] [address]  Chief Complaint  Patient presents with  . Urinary Frequency    HPI: The patient was acutely added on to clinic today with small-volume frequency. He fell last Thursday from a ladder and has extensive left arm wrist and elbow injuries requiring surgery. He may need more surgery. He bruises left leg and hip but had no pelvic fracture  He was voiding well until about a month ago when he started having a slower flow with hourly nocturia. He was voiding only 4 or 5 times a day. Since his surgery he's been voiding every 15 minutes and even 18 times last night.   I reviewed the medical record and a recent CT scan demonstrated normal kidneys and an empty bladder. No urine cultures have been sent. PVR 584. Flomax started  Foley initially not tolerated; on CIC - went ot ER with trouble to cath and foley placed  Cysto: penile and bulbar urethra normal; moderate bilobar enlargement and 2.5 cm urethra; ? Superficial TCC midline back floor of bladder; rest negative; foely replaced and urine sent for c/s  Retention; needs more surgery Nov 15; foley moderate term management strategy; finding in bladder ? TCC vs. Foley changes; nonsmoker; will need trial of void and repeat cysto in future  I was finally able to get dragon to work.  The patient had an MRI of the hip was told he may have a nodule on his prostate.  I cannot find the documents.  I did a digital rectal examination and he had a 50 g benign prostate.  Trial of voiding in about 1 month.  Eventually will need repeat cystoscopy.  Management of retention depends on progress  Today Today The patient had left forearm surgery in mid November and will need left elbow surgery.  He tolerates the Foley catheter well.  Clinically not infected.  We recapped the bladder findings and the need for cystoscopy  in the future and possible biopsy and differential diagnosis with timing of diagnosis discussed.  He will come back today for a trial of voiding  PMH: Past Medical History:  Diagnosis Date  . Diabetes mellitus without complication ([place])   . Diabetic neuropathy ([place])   . GERD (gastroesophageal reflux disease)   . Hypercholesteremia   . Hypertension   . Hypothyroidism   . PONV (postoperative nausea and vomiting)   . Wears contact lenses     Surgical History: Past Surgical History:  Procedure Laterality Date  . APPENDECTOMY    . INGUINAL HERNIA REPAIR Right   . KNEE ARTHROSCOPY W/ PARTIAL MEDIAL MENISCECTOMY Left 08/17/2010  . LUMBAR LAMINECTOMY    . ROTATOR CUFF REPAIR Right 2013  . TRIGGER FINGER RELEASE Right 10/04/2015   Procedure: RELEASE TRIGGER FINGER/A-1 PULLEY;  Surgeon: Corky Mull, MD;  Location: [place];  Service: Orthopedics;  Laterality: Right;  PREFER BIER BLOCK Diabetic - insulin and oral meds  . TYMPANOPLASTY Right     Home Medications:  Allergies as of 12/16/2016      Reactions   Crestor [rosuvastatin]    Muscle pain   Lipitor [atorvastatin]    Muscle pain      Medication List        Accurate as of 12/16/16  8:41 AM. Always use your most recent med list.          aspirin 81 MG tablet  Take 81 mg by mouth daily.   ciclopirox 8 % solution Commonly known as:  PENLAC Apply topically at bedtime. Apply over nail and surrounding skin. Apply daily over previous coat. After seven (7) days, may remove with alcohol and continue cycle.   enalapril 5 MG tablet Commonly known as:  VASOTEC Take 10 mg by mouth daily.   insulin aspart 100 UNIT/ML injection Commonly known as:  novoLOG Inject into the skin 3 (three) times daily before meals. 6 units before breakfast, 25 units before lunch, 44 units before dinner   lactulose 10 GM/15ML solution Commonly known as:  CHRONULAC Take 30 mLs (20 g total) by mouth daily as needed for mild constipation.     levothyroxine 150 MCG tablet Commonly known as:  SYNTHROID, LEVOTHROID Take 150 mcg by mouth daily before breakfast.   metFORMIN 1000 MG tablet Commonly known as:  GLUCOPHAGE Take by mouth. 1 tab in AM. 1.5 tabs in PM.   montelukast 10 MG tablet Commonly known as:  SINGULAIR Take 10 mg by mouth at bedtime.   multivitamin tablet Take 1 tablet by mouth daily.   nitrofurantoin (macrocrystal-monohydrate) 100 MG capsule Commonly known as:  MACROBID Take 1 capsule (100 mg total) every 12 (twelve) hours by mouth.   oxybutynin 5 MG tablet Commonly known as:  DITROPAN Take 1 tablet (5 mg total) by mouth every 8 (eight) hours as needed for bladder spasms.   pantoprazole 40 MG tablet Commonly known as:  PROTONIX Take 40 mg by mouth daily.   pioglitazone 45 MG tablet Commonly known as:  ACTOS Take 45 mg by mouth daily.   simvastatin 40 MG tablet Commonly known as:  ZOCOR Take 40 mg by mouth daily.   tamsulosin 0.4 MG Caps capsule Commonly known as:  FLOMAX Take 1 capsule (0.4 mg total) by mouth daily.   traMADol 50 MG tablet Commonly known as:  ULTRAM Take 1-2 tablets (50-100 mg total) by mouth every 6 (six) hours as needed.   TRESIBA FLEXTOUCH 200 UNIT/ML Sopn Generic drug:  Insulin Degludec Inject 126 Units into the skin daily.       Allergies:  Allergies  Allergen Reactions  . Crestor [Rosuvastatin]     Muscle pain  . Lipitor [Atorvastatin]     Muscle pain    Family History: Family History  Problem Relation Age of Onset  . Prostate cancer Father   . Bladder Cancer Neg Hx   . Kidney cancer Neg Hx     Social History:  reports that  has never smoked. he has never used smokeless tobacco. He reports that he does not drink alcohol or use drugs.  ROS: UROLOGY Frequent Urination?: Yes Hard to postpone urination?: No Burning/pain with urination?: No Get up at night to urinate?: Yes Leakage of urine?: No Urine stream starts and stops?: No Trouble starting  stream?: No Do you have to strain to urinate?: No Blood in urine?: No Urinary tract infection?: No Sexually transmitted disease?: No Injury to kidneys or bladder?: No Painful intercourse?: No Weak stream?: No Erection problems?: No Penile pain?: No  Gastrointestinal Nausea?: No Vomiting?: No Indigestion/heartburn?: No Diarrhea?: No Constipation?: No  Constitutional Fever: No Night sweats?: No Weight loss?: No Fatigue?: No  Skin Skin rash/lesions?: No Itching?: No  Eyes Blurred vision?: No Double vision?: No  Ears/Nose/Throat Sore throat?: No Sinus problems?: No  Hematologic/Lymphatic Swollen glands?: No Easy bruising?: No  Cardiovascular Leg swelling?: No Chest pain?: No  Respiratory Cough?: No Shortness of breath?: No  Endocrine Excessive thirst?: Yes  Musculoskeletal Back pain?: No Joint pain?: No  Neurological Headaches?: No Dizziness?: No  Psychologic Depression?: No Anxiety?: No  Physical Exam: BP (!) 169/75 (BP Location: Right Arm, Patient Position: Sitting, Cuff Size: Large)   Pulse 82   Ht 5\' 10"  (1.778 m)   Wt 241 lb 9.6 oz (109.6 kg)   BMI 34.67 kg/m   Constitutional:  Alert and oriented, No acute distress.   Laboratory Data: Lab Results  Component Value Date   WBC 10.1 10/24/2016   HGB 9.6 (L) 10/24/2016   HCT 28.3 (L) 10/24/2016   MCV 88.2 10/24/2016   PLT 332 10/24/2016    Lab Results  Component Value Date   CREATININE 0.80 10/24/2016     Urinalysis    Component Value Date/Time   COLORURINE YELLOW (A) 10/24/2016 0230   APPEARANCEUR CLEAR (A) 10/24/2016 0230   APPEARANCEUR Clear 10/08/2016 1343   LABSPEC 1.006 10/24/2016 0230   LABSPEC 1.015 04/09/2011 1042   PHURINE 7.0 10/24/2016 0230   GLUCOSEU 50 (A) 10/24/2016 0230   GLUCOSEU Negative 04/09/2011 1042   HGBUR SMALL (A) 10/24/2016 0230   BILIRUBINUR NEGATIVE 10/24/2016 0230   BILIRUBINUR Negative 10/08/2016 1343   BILIRUBINUR Negative 04/09/2011 1042    KETONESUR NEGATIVE 10/24/2016 0230   PROTEINUR 30 (A) 10/24/2016 0230   NITRITE NEGATIVE 10/24/2016 0230   LEUKOCYTESUR TRACE (A) 10/24/2016 0230   LEUKOCYTESUR Negative 10/08/2016 1343   LEUKOCYTESUR Negative 04/09/2011 1042   Pertinent Imaging: none  Assessment & Plan: The patient came back voiding well.  He was scanned for 0 mL.  I was exceptionally pleased for him.  My plan is to have him see 1 of my partners in 6 weeks for cystoscopy.  If the lesion is still apparent he would need a biopsy.  He agreed with the treatment plan and the timing associated  There are no diagnoses linked to this encounter.  No Follow-up on file.  Reece Packer, MD  [facility] [address], [place], [place] [address] [phone]

## 2016-12-16 NOTE — Progress Notes (Signed)
Fill and Pull Catheter Removal  Patient is present today for a catheter removal.  Patient was cleaned and prepped in a sterile fashion 246ml of sterile water/ saline was instilled into the bladder when the patient felt the urge to urinate. 51ml of water was then drained from the balloon.  A 16FR foley cath was removed from the bladder no complications were noted .  Patient as then given some time to void on their own.  Patient Can void  275ml on their own after some time.  Patient tolerated well.  Preformed by: Nicholas Humphrey, CMA  Follow up/ Additional notes: this afternoon for a PVR

## 2017-01-21 HISTORY — PX: BACK SURGERY: SHX140

## 2017-01-28 ENCOUNTER — Encounter: Payer: Self-pay | Admitting: Urology

## 2017-01-28 ENCOUNTER — Ambulatory Visit (INDEPENDENT_AMBULATORY_CARE_PROVIDER_SITE_OTHER): Payer: PPO | Admitting: Urology

## 2017-01-28 VITALS — BP 157/84 | HR 109 | Ht 70.0 in | Wt 240.0 lb

## 2017-01-28 DIAGNOSIS — N401 Enlarged prostate with lower urinary tract symptoms: Secondary | ICD-10-CM

## 2017-01-28 NOTE — Progress Notes (Signed)
   01/28/17  CC:  Chief Complaint  Patient presents with  . Cysto    HPI: Previously seen by Dr. Matilde Sprang for urinary retention with an indwelling Foley catheter which has resolved.  He presently has no voiding complaints.  Cystoscopy in late October remarkable for a questionable early papillary lesion at bladder base which may have been changes secondary to his indwelling Foley.  Follow-up cystoscopy was recommended.  Blood pressure (!) 157/84, pulse (!) 109, height 5\' 10"  (1.778 m), weight 240 lb (108.9 kg). NED. A&Ox3.   No respiratory distress   Abd soft, NT, ND Normal phallus with bilateral descended testicles  Cystoscopy Procedure Note  Patient identification was confirmed, informed consent was obtained, and patient was prepped using Betadine solution.  Lidocaine jelly was administered per urethral meatus.    Preoperative abx where received prior to procedure.     Pre-Procedure: - Inspection reveals a normal caliber ureteral meatus.  Procedure: The flexible cystoscope was introduced without difficulty - No urethral strictures/lesions are present. - Enlarged prostate with touching lateral lobes - Elevated bladder neck - Bilateral ureteral orifices identified - Bladder mucosa  reveals no ulcers, tumors, or lesions - No bladder stones - No trabeculation  Retroflexion shows no significant abnormalities   Post-Procedure: - Patient tolerated the procedure well  Assessment/ Plan: Previously noted cystoscopic findings no longer present.  Follow-up annually for BPH.

## 2017-01-29 ENCOUNTER — Encounter: Payer: Self-pay | Admitting: Urology

## 2017-02-12 DIAGNOSIS — H40013 Open angle with borderline findings, low risk, bilateral: Secondary | ICD-10-CM | POA: Diagnosis not present

## 2017-02-12 DIAGNOSIS — H2513 Age-related nuclear cataract, bilateral: Secondary | ICD-10-CM | POA: Diagnosis not present

## 2017-04-16 DIAGNOSIS — E119 Type 2 diabetes mellitus without complications: Secondary | ICD-10-CM | POA: Diagnosis not present

## 2017-04-21 DIAGNOSIS — E669 Obesity, unspecified: Secondary | ICD-10-CM | POA: Diagnosis not present

## 2017-04-21 DIAGNOSIS — G479 Sleep disorder, unspecified: Secondary | ICD-10-CM | POA: Diagnosis not present

## 2017-04-21 DIAGNOSIS — E782 Mixed hyperlipidemia: Secondary | ICD-10-CM | POA: Diagnosis not present

## 2017-04-21 DIAGNOSIS — Z79899 Other long term (current) drug therapy: Secondary | ICD-10-CM | POA: Diagnosis not present

## 2017-04-21 DIAGNOSIS — K219 Gastro-esophageal reflux disease without esophagitis: Secondary | ICD-10-CM | POA: Diagnosis not present

## 2017-04-21 DIAGNOSIS — R809 Proteinuria, unspecified: Secondary | ICD-10-CM | POA: Diagnosis not present

## 2017-04-21 DIAGNOSIS — F418 Other specified anxiety disorders: Secondary | ICD-10-CM | POA: Diagnosis not present

## 2017-04-21 DIAGNOSIS — I10 Essential (primary) hypertension: Secondary | ICD-10-CM | POA: Diagnosis not present

## 2017-04-21 DIAGNOSIS — E039 Hypothyroidism, unspecified: Secondary | ICD-10-CM | POA: Diagnosis not present

## 2017-04-21 DIAGNOSIS — Z794 Long term (current) use of insulin: Secondary | ICD-10-CM | POA: Diagnosis not present

## 2017-04-21 DIAGNOSIS — E1129 Type 2 diabetes mellitus with other diabetic kidney complication: Secondary | ICD-10-CM | POA: Diagnosis not present

## 2017-04-25 DIAGNOSIS — Z049 Encounter for examination and observation for unspecified reason: Secondary | ICD-10-CM | POA: Diagnosis not present

## 2017-04-25 DIAGNOSIS — H748X2 Other specified disorders of left middle ear and mastoid: Secondary | ICD-10-CM | POA: Diagnosis not present

## 2017-04-25 DIAGNOSIS — E538 Deficiency of other specified B group vitamins: Secondary | ICD-10-CM | POA: Diagnosis not present

## 2017-05-02 DIAGNOSIS — E538 Deficiency of other specified B group vitamins: Secondary | ICD-10-CM | POA: Diagnosis not present

## 2017-05-08 DIAGNOSIS — E538 Deficiency of other specified B group vitamins: Secondary | ICD-10-CM | POA: Diagnosis not present

## 2017-05-16 DIAGNOSIS — E538 Deficiency of other specified B group vitamins: Secondary | ICD-10-CM | POA: Diagnosis not present

## 2017-06-03 DIAGNOSIS — E039 Hypothyroidism, unspecified: Secondary | ICD-10-CM | POA: Diagnosis not present

## 2017-06-17 DIAGNOSIS — E538 Deficiency of other specified B group vitamins: Secondary | ICD-10-CM | POA: Diagnosis not present

## 2017-07-03 DIAGNOSIS — J40 Bronchitis, not specified as acute or chronic: Secondary | ICD-10-CM | POA: Diagnosis not present

## 2017-07-03 DIAGNOSIS — R030 Elevated blood-pressure reading, without diagnosis of hypertension: Secondary | ICD-10-CM | POA: Diagnosis not present

## 2017-07-03 DIAGNOSIS — J329 Chronic sinusitis, unspecified: Secondary | ICD-10-CM | POA: Diagnosis not present

## 2017-07-17 DIAGNOSIS — E538 Deficiency of other specified B group vitamins: Secondary | ICD-10-CM | POA: Diagnosis not present

## 2017-08-14 DIAGNOSIS — Z794 Long term (current) use of insulin: Secondary | ICD-10-CM | POA: Diagnosis not present

## 2017-08-14 DIAGNOSIS — R42 Dizziness and giddiness: Secondary | ICD-10-CM | POA: Diagnosis not present

## 2017-08-14 DIAGNOSIS — R112 Nausea with vomiting, unspecified: Secondary | ICD-10-CM | POA: Diagnosis not present

## 2017-08-14 DIAGNOSIS — E1121 Type 2 diabetes mellitus with diabetic nephropathy: Secondary | ICD-10-CM | POA: Diagnosis not present

## 2017-08-19 DIAGNOSIS — E538 Deficiency of other specified B group vitamins: Secondary | ICD-10-CM | POA: Diagnosis not present

## 2017-09-19 DIAGNOSIS — E538 Deficiency of other specified B group vitamins: Secondary | ICD-10-CM | POA: Diagnosis not present

## 2017-09-29 DIAGNOSIS — Z794 Long term (current) use of insulin: Secondary | ICD-10-CM | POA: Diagnosis not present

## 2017-09-29 DIAGNOSIS — E1142 Type 2 diabetes mellitus with diabetic polyneuropathy: Secondary | ICD-10-CM | POA: Diagnosis not present

## 2017-09-29 DIAGNOSIS — D2371 Other benign neoplasm of skin of right lower limb, including hip: Secondary | ICD-10-CM | POA: Diagnosis not present

## 2017-10-20 DIAGNOSIS — E538 Deficiency of other specified B group vitamins: Secondary | ICD-10-CM | POA: Diagnosis not present

## 2017-10-24 DIAGNOSIS — E782 Mixed hyperlipidemia: Secondary | ICD-10-CM | POA: Diagnosis not present

## 2017-10-24 DIAGNOSIS — E669 Obesity, unspecified: Secondary | ICD-10-CM | POA: Diagnosis not present

## 2017-10-24 DIAGNOSIS — F418 Other specified anxiety disorders: Secondary | ICD-10-CM | POA: Diagnosis not present

## 2017-10-24 DIAGNOSIS — G479 Sleep disorder, unspecified: Secondary | ICD-10-CM | POA: Diagnosis not present

## 2017-10-24 DIAGNOSIS — I10 Essential (primary) hypertension: Secondary | ICD-10-CM | POA: Diagnosis not present

## 2017-10-24 DIAGNOSIS — E039 Hypothyroidism, unspecified: Secondary | ICD-10-CM | POA: Diagnosis not present

## 2017-10-24 DIAGNOSIS — Z125 Encounter for screening for malignant neoplasm of prostate: Secondary | ICD-10-CM | POA: Diagnosis not present

## 2017-10-24 DIAGNOSIS — E1121 Type 2 diabetes mellitus with diabetic nephropathy: Secondary | ICD-10-CM | POA: Diagnosis not present

## 2017-10-24 DIAGNOSIS — Z0000 Encounter for general adult medical examination without abnormal findings: Secondary | ICD-10-CM | POA: Diagnosis not present

## 2017-10-24 DIAGNOSIS — Z23 Encounter for immunization: Secondary | ICD-10-CM | POA: Diagnosis not present

## 2017-10-24 DIAGNOSIS — Z794 Long term (current) use of insulin: Secondary | ICD-10-CM | POA: Diagnosis not present

## 2017-10-24 DIAGNOSIS — K219 Gastro-esophageal reflux disease without esophagitis: Secondary | ICD-10-CM | POA: Diagnosis not present

## 2017-10-24 DIAGNOSIS — E538 Deficiency of other specified B group vitamins: Secondary | ICD-10-CM | POA: Diagnosis not present

## 2017-10-24 DIAGNOSIS — Z79899 Other long term (current) drug therapy: Secondary | ICD-10-CM | POA: Diagnosis not present

## 2017-11-20 DIAGNOSIS — S0501XA Injury of conjunctiva and corneal abrasion without foreign body, right eye, initial encounter: Secondary | ICD-10-CM | POA: Diagnosis not present

## 2017-11-21 DIAGNOSIS — E538 Deficiency of other specified B group vitamins: Secondary | ICD-10-CM | POA: Diagnosis not present

## 2017-12-12 DIAGNOSIS — E039 Hypothyroidism, unspecified: Secondary | ICD-10-CM | POA: Diagnosis not present

## 2017-12-12 DIAGNOSIS — Z79899 Other long term (current) drug therapy: Secondary | ICD-10-CM | POA: Diagnosis not present

## 2017-12-22 DIAGNOSIS — E538 Deficiency of other specified B group vitamins: Secondary | ICD-10-CM | POA: Diagnosis not present

## 2017-12-22 DIAGNOSIS — J329 Chronic sinusitis, unspecified: Secondary | ICD-10-CM | POA: Diagnosis not present

## 2017-12-22 DIAGNOSIS — J40 Bronchitis, not specified as acute or chronic: Secondary | ICD-10-CM | POA: Diagnosis not present

## 2017-12-22 DIAGNOSIS — R05 Cough: Secondary | ICD-10-CM | POA: Diagnosis not present

## 2018-01-23 DIAGNOSIS — Z79899 Other long term (current) drug therapy: Secondary | ICD-10-CM | POA: Diagnosis not present

## 2018-01-23 DIAGNOSIS — E039 Hypothyroidism, unspecified: Secondary | ICD-10-CM | POA: Diagnosis not present

## 2018-01-27 DIAGNOSIS — E538 Deficiency of other specified B group vitamins: Secondary | ICD-10-CM | POA: Diagnosis not present

## 2018-01-28 ENCOUNTER — Ambulatory Visit: Payer: PPO | Admitting: Urology

## 2018-01-28 ENCOUNTER — Encounter: Payer: Self-pay | Admitting: Urology

## 2018-01-28 ENCOUNTER — Other Ambulatory Visit: Payer: Self-pay | Admitting: Urology

## 2018-01-28 VITALS — BP 130/80 | HR 80 | Ht 70.0 in | Wt 245.6 lb

## 2018-01-28 DIAGNOSIS — N401 Enlarged prostate with lower urinary tract symptoms: Secondary | ICD-10-CM

## 2018-01-28 LAB — BLADDER SCAN AMB NON-IMAGING

## 2018-01-28 MED ORDER — TAMSULOSIN HCL 0.4 MG PO CAPS: 0.4000 mg | ORAL_CAPSULE | Freq: Every day | ORAL | 3 refills | Status: DC

## 2018-01-28 NOTE — Progress Notes (Signed)
01/28/2018 10:49 AM   Nicholas Humphrey May 12, 1950 [id number]  Referring provider: Sallee Lange, NP [address] [place], [place] [address]  Chief Complaint  Patient presents with  . Follow-up   HPI: Nicholas Humphrey is a 68 yo M who returns today for a 1 year follow-up for evaluation and management of BPH. He was accompanied by his wife.   -PVR is 45 mL -Urinary retention after back surgery 07/2017; wife performed intermittent cath x2; resolved  -No other episodes of urinary retention -No bothersome urinary issues -Currently taking Tamsulosin - Denies dysuria, gross hematuria or flank/abdominal/pelvic/scrotal pain.   PMH: Past Medical History:  Diagnosis Date  . Diabetes mellitus without complication ([facility])   . Diabetic neuropathy ([facility])   . GERD (gastroesophageal reflux disease)   . Hypercholesteremia   . Hypertension   . Hypothyroidism   . PONV (postoperative nausea and vomiting)   . Wears contact lenses     Surgical History: Past Surgical History:  Procedure Laterality Date  . APPENDECTOMY    . INGUINAL HERNIA REPAIR Right   . KNEE ARTHROSCOPY W/ PARTIAL MEDIAL MENISCECTOMY Left 08/17/2010  . LUMBAR LAMINECTOMY    . ROTATOR CUFF REPAIR Right 2013  . TRIGGER FINGER RELEASE Right 10/04/2015   Procedure: RELEASE TRIGGER FINGER/A-1 PULLEY;  Surgeon: Corky Mull, MD;  Location: [facility];  Service: Orthopedics;  Laterality: Right;  PREFER BIER BLOCK Diabetic - insulin and oral meds  . TYMPANOPLASTY Right     Home Medications:  Allergies as of 01/28/2018      Reactions   Pregabalin Swelling   Legs and ankles   Oxycodone Nausea And Vomiting   Other reaction(s): Hallucinations, Vomiting   Crestor [rosuvastatin]    Muscle pain   Lipitor [atorvastatin]    Muscle pain   Tramadol Anxiety   Other reaction(s): Hallucinations      Medication List       Accurate as of January 28, 2018 10:49 AM. Always use your most recent med list.          acetaminophen 500 MG tablet Commonly known as:  TYLENOL Take by mouth.   aspirin 81 MG tablet Take 81 mg by mouth daily.   ciclopirox 8 % solution Commonly known as:  PENLAC Apply topically at bedtime. Apply over nail and surrounding skin. Apply daily over previous coat. After seven (7) days, may remove with alcohol and continue cycle.   cyanocobalamin 1000 MCG/ML injection Commonly known as:  (VITAMIN B-12) Inject into the muscle.   enalapril 5 MG tablet Commonly known as:  VASOTEC Take 10 mg by mouth daily.   FREESTYLE LIBRE READER Devi Use 1 kit as directed   FREESTYLE LIBRE SENSOR SYSTEM Misc Use 1 each every 14 (fourteen) days   HYDROcodone-homatropine 5-1.5 MG/5ML syrup Commonly known as:  HYCODAN Take by mouth.   hydrOXYzine 25 MG tablet Commonly known as:  ATARAX/VISTARIL hydroxyzine HCl 25 mg tablet   insulin aspart 100 UNIT/ML injection Commonly known as:  novoLOG Inject into the skin 3 (three) times daily before meals. 6 units before breakfast, 25 units before lunch, 44 units before dinner   lactulose 10 GM/15ML solution Commonly known as:  CHRONULAC Take 30 mLs (20 g total) by mouth daily as needed for mild constipation.   levothyroxine 150 MCG tablet Commonly known as:  SYNTHROID, LEVOTHROID Take 150 mcg by mouth daily before breakfast.   meclizine 25 MG tablet Commonly known as:  ANTIVERT meclizine 25 mg tablet  metFORMIN 1000 MG tablet Commonly known as:  GLUCOPHAGE Take by mouth. 1 tab in AM. 1.5 tabs in PM.   MOBIC 15 MG tablet Generic drug:  meloxicam Mobic 15 mg tablet  Take 1 tablet every day by oral route with meals.   montelukast 10 MG tablet Commonly known as:  SINGULAIR Take 10 mg by mouth at bedtime.   multivitamin tablet Take 1 tablet by mouth daily.   ofloxacin 0.3 % ophthalmic solution Commonly known as:  OCUFLOX ofloxacin 0.3 % eye drops   pantoprazole 40 MG tablet Commonly known as:  PROTONIX Take 40 mg by mouth  daily.   PARoxetine 20 MG tablet Commonly known as:  PAXIL paroxetine 20 mg tablet   pioglitazone 45 MG tablet Commonly known as:  ACTOS Take 45 mg by mouth daily.   PRECISION QID TEST test strip Generic drug:  glucose blood Use 1 strip via meter 3-4 times daily as directed in a rotating pattern before a meal, 2 hours after a meal or bedtime.   PREVNAR 13 Susp injection Generic drug:  pneumococcal 13-valent conjugate vaccine Prevnar 13 (PF) 0.5 mL intramuscular syringe   simvastatin 40 MG tablet Commonly known as:  ZOCOR Take 40 mg by mouth daily.   tamsulosin 0.4 MG Caps capsule Commonly known as:  FLOMAX Take 1 capsule (0.4 mg total) by mouth daily.   traMADol 50 MG tablet Commonly known as:  ULTRAM Take 1-2 tablets (50-100 mg total) by mouth every 6 (six) hours as needed.   traZODone 50 MG tablet Commonly known as:  DESYREL trazodone 50 mg tablet   TRESIBA FLEXTOUCH 200 UNIT/ML Sopn Generic drug:  Insulin Degludec Inject 126 Units into the skin daily.       Allergies:  Allergies  Allergen Reactions  . Pregabalin Swelling    Legs and ankles  . Oxycodone Nausea And Vomiting    Other reaction(s): Hallucinations, Vomiting  . Crestor [Rosuvastatin]     Muscle pain  . Lipitor [Atorvastatin]     Muscle pain  . Tramadol Anxiety    Other reaction(s): Hallucinations    Family History: Family History  Problem Relation Age of Onset  . Prostate cancer Father   . Bladder Cancer Neg Hx   . Kidney cancer Neg Hx     Social History:  reports that he has never smoked. He has never used smokeless tobacco. He reports that he does not drink alcohol or use drugs.  ROS: UROLOGY Frequent Urination?: Yes Hard to postpone urination?: No Burning/pain with urination?: No Get up at night to urinate?: Yes Leakage of urine?: No Urine stream starts and stops?: No Trouble starting stream?: No Do you have to strain to urinate?: No Blood in urine?: No Urinary tract  infection?: No Sexually transmitted disease?: No Injury to kidneys or bladder?: No Painful intercourse?: No Weak stream?: No Erection problems?: Yes Penile pain?: No  Gastrointestinal Nausea?: No Vomiting?: No Indigestion/heartburn?: Yes Diarrhea?: No Constipation?: No  Constitutional Fever: No Night sweats?: No Weight loss?: No Fatigue?: No  Skin Skin rash/lesions?: No Itching?: No  Eyes Blurred vision?: No Double vision?: No  Ears/Nose/Throat Sore throat?: No Sinus problems?: No  Hematologic/Lymphatic Swollen glands?: No Easy bruising?: No  Cardiovascular Leg swelling?: No Chest pain?: No  Respiratory Cough?: No Shortness of breath?: No  Endocrine Excessive thirst?: No  Musculoskeletal Back pain?: Yes Joint pain?: No  Neurological Headaches?: No Dizziness?: No  Psychologic Depression?: No Anxiety?: No  Physical Exam: BP 130/80 (BP Location: Left Arm,  Patient Position: Sitting, Cuff Size: Large)   Pulse 80   Ht '5\' 10"'$  (1.778 m)   Wt 245 lb 9.6 oz (111.4 kg)   BMI 35.24 kg/m   Constitutional:  Well nourished. Alert and oriented, No acute distress. HEENT: [place] AT, moist mucus membranes.  Trachea midline, no masses. Cardiovascular: No clubbing, cyanosis, or edema. Respiratory: Normal respiratory effort, no increased work of breathing. Rectal: Patient with  normal sphincter tone. Anus and perineum without scarring or rashes. No rectal masses are appreciated. Prostate is approximately 35 grams, smooth rectal tone, no nodules are appreciated. Seminal vesicles are normal. Skin: No rashes, bruises or suspicious lesions. Neurologic: Grossly intact, no focal deficits, moving all 4 extremities. Psychiatric: Normal mood and affect.  Pertinent Imaging: Results for orders placed or performed in visit on 01/28/18  Bladder Scan (Post Void Residual) in office  Result Value Ref Range   Scan Result 19m     Assessment & Plan:   1. BPH -Most recent PSA  from 10/24/2017 is 1.50.  -PVR is 45 mL. -no bothersome urinary issues  -no recurrent episodes of urinary retention since intermittent cath exchange by wife after back surgery  -Continue taking Tamsulosin; 90 day w/refill given    SAbbie Sons MD  [facility] [place] [phone] I, NLucas Mallow am acting as a scribe for Dr. SNicki ReaperC. Carlita Whitcomb,  I, SAbbie Sons MD, have reviewed all documentation for this visit. The documentation on 01/28/18 for the exam, diagnosis, procedures, and orders are all accurate and complete.

## 2018-02-05 DIAGNOSIS — J0101 Acute recurrent maxillary sinusitis: Secondary | ICD-10-CM | POA: Diagnosis not present

## 2018-02-26 DIAGNOSIS — J111 Influenza due to unidentified influenza virus with other respiratory manifestations: Secondary | ICD-10-CM | POA: Diagnosis not present

## 2018-02-26 DIAGNOSIS — E538 Deficiency of other specified B group vitamins: Secondary | ICD-10-CM | POA: Diagnosis not present

## 2018-03-10 DIAGNOSIS — J0101 Acute recurrent maxillary sinusitis: Secondary | ICD-10-CM | POA: Diagnosis not present

## 2018-04-15 DIAGNOSIS — E538 Deficiency of other specified B group vitamins: Secondary | ICD-10-CM | POA: Diagnosis not present

## 2018-04-27 DIAGNOSIS — R079 Chest pain, unspecified: Secondary | ICD-10-CM | POA: Diagnosis not present

## 2018-04-27 DIAGNOSIS — E669 Obesity, unspecified: Secondary | ICD-10-CM | POA: Diagnosis not present

## 2018-04-27 DIAGNOSIS — Z79899 Other long term (current) drug therapy: Secondary | ICD-10-CM | POA: Diagnosis not present

## 2018-04-27 DIAGNOSIS — M5126 Other intervertebral disc displacement, lumbar region: Secondary | ICD-10-CM | POA: Diagnosis not present

## 2018-04-27 DIAGNOSIS — L03011 Cellulitis of right finger: Secondary | ICD-10-CM | POA: Diagnosis not present

## 2018-04-27 DIAGNOSIS — R531 Weakness: Secondary | ICD-10-CM | POA: Diagnosis not present

## 2018-04-27 DIAGNOSIS — E1121 Type 2 diabetes mellitus with diabetic nephropathy: Secondary | ICD-10-CM | POA: Diagnosis not present

## 2018-04-27 DIAGNOSIS — I10 Essential (primary) hypertension: Secondary | ICD-10-CM | POA: Diagnosis not present

## 2018-04-27 DIAGNOSIS — Z794 Long term (current) use of insulin: Secondary | ICD-10-CM | POA: Diagnosis not present

## 2018-04-27 DIAGNOSIS — E039 Hypothyroidism, unspecified: Secondary | ICD-10-CM | POA: Diagnosis not present

## 2018-04-27 DIAGNOSIS — M961 Postlaminectomy syndrome, not elsewhere classified: Secondary | ICD-10-CM | POA: Diagnosis not present

## 2018-04-27 DIAGNOSIS — E1142 Type 2 diabetes mellitus with diabetic polyneuropathy: Secondary | ICD-10-CM | POA: Diagnosis not present

## 2018-04-27 DIAGNOSIS — E538 Deficiency of other specified B group vitamins: Secondary | ICD-10-CM | POA: Diagnosis not present

## 2018-04-27 DIAGNOSIS — E782 Mixed hyperlipidemia: Secondary | ICD-10-CM | POA: Diagnosis not present

## 2018-04-27 DIAGNOSIS — M461 Sacroiliitis, not elsewhere classified: Secondary | ICD-10-CM | POA: Diagnosis not present

## 2018-04-27 DIAGNOSIS — F418 Other specified anxiety disorders: Secondary | ICD-10-CM | POA: Diagnosis not present

## 2018-04-27 DIAGNOSIS — R001 Bradycardia, unspecified: Secondary | ICD-10-CM | POA: Diagnosis not present

## 2018-04-27 DIAGNOSIS — L97521 Non-pressure chronic ulcer of other part of left foot limited to breakdown of skin: Secondary | ICD-10-CM | POA: Diagnosis not present

## 2018-05-18 DIAGNOSIS — E538 Deficiency of other specified B group vitamins: Secondary | ICD-10-CM | POA: Diagnosis not present

## 2018-05-22 DIAGNOSIS — H7192 Unspecified cholesteatoma, left ear: Secondary | ICD-10-CM | POA: Diagnosis not present

## 2018-05-22 DIAGNOSIS — H7092 Unspecified mastoiditis, left ear: Secondary | ICD-10-CM | POA: Diagnosis not present

## 2018-05-22 DIAGNOSIS — R9089 Other abnormal findings on diagnostic imaging of central nervous system: Secondary | ICD-10-CM | POA: Diagnosis not present

## 2018-06-17 DIAGNOSIS — E119 Type 2 diabetes mellitus without complications: Secondary | ICD-10-CM | POA: Diagnosis not present

## 2018-06-30 DIAGNOSIS — H40023 Open angle with borderline findings, high risk, bilateral: Secondary | ICD-10-CM | POA: Diagnosis not present

## 2018-06-30 DIAGNOSIS — H47021 Hemorrhage in optic nerve sheath, right eye: Secondary | ICD-10-CM | POA: Diagnosis not present

## 2018-06-30 DIAGNOSIS — H40013 Open angle with borderline findings, low risk, bilateral: Secondary | ICD-10-CM | POA: Diagnosis not present

## 2018-07-13 DIAGNOSIS — L299 Pruritus, unspecified: Secondary | ICD-10-CM | POA: Diagnosis not present

## 2018-07-13 DIAGNOSIS — J309 Allergic rhinitis, unspecified: Secondary | ICD-10-CM | POA: Diagnosis not present

## 2018-07-13 DIAGNOSIS — H6123 Impacted cerumen, bilateral: Secondary | ICD-10-CM | POA: Diagnosis not present

## 2018-07-13 DIAGNOSIS — M26609 Unspecified temporomandibular joint disorder, unspecified side: Secondary | ICD-10-CM | POA: Diagnosis not present

## 2018-07-13 DIAGNOSIS — H90A32 Mixed conductive and sensorineural hearing loss, unilateral, left ear with restricted hearing on the contralateral side: Secondary | ICD-10-CM | POA: Diagnosis not present

## 2018-07-13 DIAGNOSIS — H903 Sensorineural hearing loss, bilateral: Secondary | ICD-10-CM | POA: Diagnosis not present

## 2018-07-13 DIAGNOSIS — R05 Cough: Secondary | ICD-10-CM | POA: Diagnosis not present

## 2018-09-30 DIAGNOSIS — E1142 Type 2 diabetes mellitus with diabetic polyneuropathy: Secondary | ICD-10-CM | POA: Diagnosis not present

## 2018-09-30 DIAGNOSIS — Z794 Long term (current) use of insulin: Secondary | ICD-10-CM | POA: Diagnosis not present

## 2018-10-28 DIAGNOSIS — E039 Hypothyroidism, unspecified: Secondary | ICD-10-CM | POA: Diagnosis not present

## 2018-10-28 DIAGNOSIS — Z0000 Encounter for general adult medical examination without abnormal findings: Secondary | ICD-10-CM | POA: Diagnosis not present

## 2018-10-28 DIAGNOSIS — R200 Anesthesia of skin: Secondary | ICD-10-CM | POA: Diagnosis not present

## 2018-10-28 DIAGNOSIS — E782 Mixed hyperlipidemia: Secondary | ICD-10-CM | POA: Diagnosis not present

## 2018-10-28 DIAGNOSIS — E538 Deficiency of other specified B group vitamins: Secondary | ICD-10-CM | POA: Diagnosis not present

## 2018-10-28 DIAGNOSIS — E1121 Type 2 diabetes mellitus with diabetic nephropathy: Secondary | ICD-10-CM | POA: Diagnosis not present

## 2018-10-28 DIAGNOSIS — I10 Essential (primary) hypertension: Secondary | ICD-10-CM | POA: Diagnosis not present

## 2018-10-28 DIAGNOSIS — Z23 Encounter for immunization: Secondary | ICD-10-CM | POA: Diagnosis not present

## 2018-10-28 DIAGNOSIS — R0602 Shortness of breath: Secondary | ICD-10-CM | POA: Diagnosis not present

## 2018-10-28 DIAGNOSIS — R05 Cough: Secondary | ICD-10-CM | POA: Diagnosis not present

## 2018-10-28 DIAGNOSIS — R062 Wheezing: Secondary | ICD-10-CM | POA: Diagnosis not present

## 2018-10-28 DIAGNOSIS — E669 Obesity, unspecified: Secondary | ICD-10-CM | POA: Diagnosis not present

## 2018-10-28 DIAGNOSIS — Z79899 Other long term (current) drug therapy: Secondary | ICD-10-CM | POA: Diagnosis not present

## 2018-10-28 DIAGNOSIS — F418 Other specified anxiety disorders: Secondary | ICD-10-CM | POA: Diagnosis not present

## 2018-10-28 DIAGNOSIS — Z794 Long term (current) use of insulin: Secondary | ICD-10-CM | POA: Diagnosis not present

## 2018-11-29 DIAGNOSIS — G4733 Obstructive sleep apnea (adult) (pediatric): Secondary | ICD-10-CM | POA: Diagnosis not present

## 2018-11-30 DIAGNOSIS — M9905 Segmental and somatic dysfunction of pelvic region: Secondary | ICD-10-CM | POA: Diagnosis not present

## 2018-11-30 DIAGNOSIS — M9904 Segmental and somatic dysfunction of sacral region: Secondary | ICD-10-CM | POA: Diagnosis not present

## 2018-11-30 DIAGNOSIS — M9901 Segmental and somatic dysfunction of cervical region: Secondary | ICD-10-CM | POA: Diagnosis not present

## 2018-11-30 DIAGNOSIS — M9903 Segmental and somatic dysfunction of lumbar region: Secondary | ICD-10-CM | POA: Diagnosis not present

## 2018-12-02 DIAGNOSIS — M9904 Segmental and somatic dysfunction of sacral region: Secondary | ICD-10-CM | POA: Diagnosis not present

## 2018-12-02 DIAGNOSIS — M9903 Segmental and somatic dysfunction of lumbar region: Secondary | ICD-10-CM | POA: Diagnosis not present

## 2018-12-02 DIAGNOSIS — M9905 Segmental and somatic dysfunction of pelvic region: Secondary | ICD-10-CM | POA: Diagnosis not present

## 2018-12-02 DIAGNOSIS — M9901 Segmental and somatic dysfunction of cervical region: Secondary | ICD-10-CM | POA: Diagnosis not present

## 2018-12-03 DIAGNOSIS — M9901 Segmental and somatic dysfunction of cervical region: Secondary | ICD-10-CM | POA: Diagnosis not present

## 2018-12-03 DIAGNOSIS — M9903 Segmental and somatic dysfunction of lumbar region: Secondary | ICD-10-CM | POA: Diagnosis not present

## 2018-12-03 DIAGNOSIS — M9904 Segmental and somatic dysfunction of sacral region: Secondary | ICD-10-CM | POA: Diagnosis not present

## 2018-12-03 DIAGNOSIS — M9905 Segmental and somatic dysfunction of pelvic region: Secondary | ICD-10-CM | POA: Diagnosis not present

## 2018-12-07 DIAGNOSIS — M9904 Segmental and somatic dysfunction of sacral region: Secondary | ICD-10-CM | POA: Diagnosis not present

## 2018-12-07 DIAGNOSIS — M9905 Segmental and somatic dysfunction of pelvic region: Secondary | ICD-10-CM | POA: Diagnosis not present

## 2018-12-07 DIAGNOSIS — M9903 Segmental and somatic dysfunction of lumbar region: Secondary | ICD-10-CM | POA: Diagnosis not present

## 2018-12-07 DIAGNOSIS — M9901 Segmental and somatic dysfunction of cervical region: Secondary | ICD-10-CM | POA: Diagnosis not present

## 2018-12-09 DIAGNOSIS — M9904 Segmental and somatic dysfunction of sacral region: Secondary | ICD-10-CM | POA: Diagnosis not present

## 2018-12-09 DIAGNOSIS — M9905 Segmental and somatic dysfunction of pelvic region: Secondary | ICD-10-CM | POA: Diagnosis not present

## 2018-12-09 DIAGNOSIS — M9903 Segmental and somatic dysfunction of lumbar region: Secondary | ICD-10-CM | POA: Diagnosis not present

## 2018-12-09 DIAGNOSIS — M9901 Segmental and somatic dysfunction of cervical region: Secondary | ICD-10-CM | POA: Diagnosis not present

## 2018-12-10 DIAGNOSIS — M9905 Segmental and somatic dysfunction of pelvic region: Secondary | ICD-10-CM | POA: Diagnosis not present

## 2018-12-10 DIAGNOSIS — M9903 Segmental and somatic dysfunction of lumbar region: Secondary | ICD-10-CM | POA: Diagnosis not present

## 2018-12-10 DIAGNOSIS — M9901 Segmental and somatic dysfunction of cervical region: Secondary | ICD-10-CM | POA: Diagnosis not present

## 2018-12-10 DIAGNOSIS — M9904 Segmental and somatic dysfunction of sacral region: Secondary | ICD-10-CM | POA: Diagnosis not present

## 2018-12-11 ENCOUNTER — Other Ambulatory Visit: Payer: Self-pay

## 2018-12-11 DIAGNOSIS — Z20822 Contact with and (suspected) exposure to covid-19: Secondary | ICD-10-CM

## 2018-12-14 DIAGNOSIS — M9905 Segmental and somatic dysfunction of pelvic region: Secondary | ICD-10-CM | POA: Diagnosis not present

## 2018-12-14 DIAGNOSIS — M9903 Segmental and somatic dysfunction of lumbar region: Secondary | ICD-10-CM | POA: Diagnosis not present

## 2018-12-14 DIAGNOSIS — M9904 Segmental and somatic dysfunction of sacral region: Secondary | ICD-10-CM | POA: Diagnosis not present

## 2018-12-14 DIAGNOSIS — M9901 Segmental and somatic dysfunction of cervical region: Secondary | ICD-10-CM | POA: Diagnosis not present

## 2018-12-14 LAB — NOVEL CORONAVIRUS, NAA: SARS-CoV-2, NAA: NOT DETECTED

## 2018-12-16 DIAGNOSIS — M9905 Segmental and somatic dysfunction of pelvic region: Secondary | ICD-10-CM | POA: Diagnosis not present

## 2018-12-16 DIAGNOSIS — M9901 Segmental and somatic dysfunction of cervical region: Secondary | ICD-10-CM | POA: Diagnosis not present

## 2018-12-16 DIAGNOSIS — M9903 Segmental and somatic dysfunction of lumbar region: Secondary | ICD-10-CM | POA: Diagnosis not present

## 2018-12-16 DIAGNOSIS — M9904 Segmental and somatic dysfunction of sacral region: Secondary | ICD-10-CM | POA: Diagnosis not present

## 2018-12-21 DIAGNOSIS — M9903 Segmental and somatic dysfunction of lumbar region: Secondary | ICD-10-CM | POA: Diagnosis not present

## 2018-12-21 DIAGNOSIS — M9901 Segmental and somatic dysfunction of cervical region: Secondary | ICD-10-CM | POA: Diagnosis not present

## 2018-12-21 DIAGNOSIS — M9905 Segmental and somatic dysfunction of pelvic region: Secondary | ICD-10-CM | POA: Diagnosis not present

## 2018-12-21 DIAGNOSIS — M9904 Segmental and somatic dysfunction of sacral region: Secondary | ICD-10-CM | POA: Diagnosis not present

## 2018-12-22 DIAGNOSIS — H16142 Punctate keratitis, left eye: Secondary | ICD-10-CM | POA: Diagnosis not present

## 2018-12-23 DIAGNOSIS — M9901 Segmental and somatic dysfunction of cervical region: Secondary | ICD-10-CM | POA: Diagnosis not present

## 2018-12-23 DIAGNOSIS — M9905 Segmental and somatic dysfunction of pelvic region: Secondary | ICD-10-CM | POA: Diagnosis not present

## 2018-12-23 DIAGNOSIS — M9903 Segmental and somatic dysfunction of lumbar region: Secondary | ICD-10-CM | POA: Diagnosis not present

## 2018-12-23 DIAGNOSIS — M9904 Segmental and somatic dysfunction of sacral region: Secondary | ICD-10-CM | POA: Diagnosis not present

## 2018-12-24 DIAGNOSIS — M9903 Segmental and somatic dysfunction of lumbar region: Secondary | ICD-10-CM | POA: Diagnosis not present

## 2018-12-24 DIAGNOSIS — M9904 Segmental and somatic dysfunction of sacral region: Secondary | ICD-10-CM | POA: Diagnosis not present

## 2018-12-24 DIAGNOSIS — M9905 Segmental and somatic dysfunction of pelvic region: Secondary | ICD-10-CM | POA: Diagnosis not present

## 2018-12-24 DIAGNOSIS — M9901 Segmental and somatic dysfunction of cervical region: Secondary | ICD-10-CM | POA: Diagnosis not present

## 2018-12-25 DIAGNOSIS — M436 Torticollis: Secondary | ICD-10-CM | POA: Diagnosis not present

## 2018-12-25 DIAGNOSIS — R200 Anesthesia of skin: Secondary | ICD-10-CM | POA: Diagnosis not present

## 2018-12-25 DIAGNOSIS — R202 Paresthesia of skin: Secondary | ICD-10-CM | POA: Diagnosis not present

## 2018-12-28 DIAGNOSIS — M9905 Segmental and somatic dysfunction of pelvic region: Secondary | ICD-10-CM | POA: Diagnosis not present

## 2018-12-28 DIAGNOSIS — M9903 Segmental and somatic dysfunction of lumbar region: Secondary | ICD-10-CM | POA: Diagnosis not present

## 2018-12-28 DIAGNOSIS — M9904 Segmental and somatic dysfunction of sacral region: Secondary | ICD-10-CM | POA: Diagnosis not present

## 2018-12-28 DIAGNOSIS — M9901 Segmental and somatic dysfunction of cervical region: Secondary | ICD-10-CM | POA: Diagnosis not present

## 2018-12-31 DIAGNOSIS — M9905 Segmental and somatic dysfunction of pelvic region: Secondary | ICD-10-CM | POA: Diagnosis not present

## 2018-12-31 DIAGNOSIS — M9904 Segmental and somatic dysfunction of sacral region: Secondary | ICD-10-CM | POA: Diagnosis not present

## 2018-12-31 DIAGNOSIS — M9901 Segmental and somatic dysfunction of cervical region: Secondary | ICD-10-CM | POA: Diagnosis not present

## 2018-12-31 DIAGNOSIS — M9903 Segmental and somatic dysfunction of lumbar region: Secondary | ICD-10-CM | POA: Diagnosis not present

## 2019-01-13 DIAGNOSIS — M9901 Segmental and somatic dysfunction of cervical region: Secondary | ICD-10-CM | POA: Diagnosis not present

## 2019-01-13 DIAGNOSIS — M9905 Segmental and somatic dysfunction of pelvic region: Secondary | ICD-10-CM | POA: Diagnosis not present

## 2019-01-13 DIAGNOSIS — M9904 Segmental and somatic dysfunction of sacral region: Secondary | ICD-10-CM | POA: Diagnosis not present

## 2019-01-13 DIAGNOSIS — M9903 Segmental and somatic dysfunction of lumbar region: Secondary | ICD-10-CM | POA: Diagnosis not present

## 2019-02-03 ENCOUNTER — Encounter: Payer: Self-pay | Admitting: Urology

## 2019-02-03 ENCOUNTER — Other Ambulatory Visit: Payer: Self-pay

## 2019-02-03 ENCOUNTER — Ambulatory Visit: Payer: Medicare Other | Admitting: Urology

## 2019-02-03 VITALS — BP 181/96 | HR 93 | Ht 69.0 in | Wt 250.0 lb

## 2019-02-03 DIAGNOSIS — N401 Enlarged prostate with lower urinary tract symptoms: Secondary | ICD-10-CM

## 2019-02-03 LAB — BLADDER SCAN AMB NON-IMAGING: Scan Result: 0

## 2019-02-03 MED ORDER — TAMSULOSIN HCL 0.4 MG PO CAPS: 0.4000 mg | ORAL_CAPSULE | Freq: Every day | ORAL | 3 refills | Status: DC

## 2019-02-03 NOTE — Progress Notes (Signed)
02/03/2019 10:19 AM   Micah Flesher 1950-06-07 [id number]  Referring provider: Sallee Lange, NP [address] [place],  [place] [address]  Chief Complaint  Patient presents with  . Other    Urologic history: 1.  BPH with lower urinary tract symptoms -Episode urinary retention after back surgery 2019 which resolved -Tamsulosin   HPI: 69 y.o. male presents for annual follow-up.  Since his visit last year he states he is doing very well.  He remains on tamsulosin.  He has mild lower urinary tract symptoms including nocturia x2-3 and occasional frequency and decreased stream.  IPSS completed today was 5/35 with a QoL rated 1/6.  His last PSA was October 2019.  Denies dysuria or gross hematuria.  No flank, abdominal or scrotal pain.   PMH: Past Medical History:  Diagnosis Date  . Diabetes mellitus without complication ([facility])   . Diabetic neuropathy ([facility])   . GERD (gastroesophageal reflux disease)   . Hypercholesteremia   . Hypertension   . Hypothyroidism   . PONV (postoperative nausea and vomiting)   . Wears contact lenses     Surgical History: Past Surgical History:  Procedure Laterality Date  . APPENDECTOMY    . INGUINAL HERNIA REPAIR Right   . KNEE ARTHROSCOPY W/ PARTIAL MEDIAL MENISCECTOMY Left 08/17/2010  . LUMBAR LAMINECTOMY    . ROTATOR CUFF REPAIR Right 2013  . TRIGGER FINGER RELEASE Right 10/04/2015   Procedure: RELEASE TRIGGER FINGER/A-1 PULLEY;  Surgeon: Corky Mull, MD;  Location: [facility];  Service: Orthopedics;  Laterality: Right;  PREFER BIER BLOCK Diabetic - insulin and oral meds  . TYMPANOPLASTY Right     Home Medications:  Allergies as of 02/03/2019      Reactions   Pregabalin Swelling   Legs and ankles   Oxycodone Nausea And Vomiting   Other reaction(s): Hallucinations, Vomiting   Crestor [rosuvastatin]    Muscle pain   Doxycycline Nausea Only   Lipitor [atorvastatin]    Muscle pain   Tramadol Anxiety   Other  reaction(s): Hallucinations      Medication List       Accurate as of February 03, 2019 10:19 AM. If you have any questions, ask your nurse or doctor.        acetaminophen 500 MG tablet Commonly known as: TYLENOL Take by mouth.   aspirin 81 MG tablet Take 81 mg by mouth daily.   ciclopirox 8 % solution Commonly known as: PENLAC Apply topically at bedtime. Apply over nail and surrounding skin. Apply daily over previous coat. After seven (7) days, may remove with alcohol and continue cycle.   cyanocobalamin 1000 MCG/ML injection Commonly known as: (VITAMIN B-12) Inject into the muscle.   enalapril 5 MG tablet Commonly known as: VASOTEC Take 10 mg by mouth daily.   FreeStyle [employer] Use 1 kit as directed   [employer] Use 1 each every 14 (fourteen) days   HYDROcodone-homatropine 5-1.5 MG/5ML syrup Commonly known as: HYCODAN Take by mouth.   hydrOXYzine 25 MG tablet Commonly known as: ATARAX/VISTARIL hydroxyzine HCl 25 mg tablet   insulin aspart 100 UNIT/ML injection Commonly known as: novoLOG Inject into the skin 3 (three) times daily before meals. 6 units before breakfast, 25 units before lunch, 44 units before dinner   lactulose 10 GM/15ML solution Commonly known as: CHRONULAC Take 30 mLs (20 g total) by mouth daily as needed for mild constipation.   levothyroxine 150 MCG tablet Commonly known  as: SYNTHROID Take 150 mcg by mouth daily before breakfast.   meclizine 25 MG tablet Commonly known as: ANTIVERT meclizine 25 mg tablet   metFORMIN 1000 MG tablet Commonly known as: GLUCOPHAGE Take by mouth. 1 tab in AM. 1.5 tabs in PM.   Mobic 15 MG tablet Generic drug: meloxicam Mobic 15 mg tablet  Take 1 tablet every day by oral route with meals.   montelukast 10 MG tablet Commonly known as: SINGULAIR Take 10 mg by mouth at bedtime.   multivitamin tablet Take 1 tablet by mouth daily.   ofloxacin 0.3 % ophthalmic  solution Commonly known as: OCUFLOX ofloxacin 0.3 % eye drops   pantoprazole 40 MG tablet Commonly known as: PROTONIX Take 40 mg by mouth daily.   PARoxetine 20 MG tablet Commonly known as: PAXIL paroxetine 20 mg tablet   pioglitazone 45 MG tablet Commonly known as: ACTOS Take 45 mg by mouth daily.   Precision QID Test test strip Generic drug: glucose blood Use 1 strip via meter 3-4 times daily as directed in a rotating pattern before a meal, 2 hours after a meal or bedtime.   Prevnar 13 Susp injection Generic drug: pneumococcal 13-valent conjugate vaccine Prevnar 13 (PF) 0.5 mL intramuscular syringe   promethazine 25 MG tablet Commonly known as: PHENERGAN promethazine 25 mg tablet   simvastatin 40 MG tablet Commonly known as: ZOCOR Take 40 mg by mouth daily.   tamsulosin 0.4 MG Caps capsule Commonly known as: FLOMAX Take 1 capsule (0.4 mg total) by mouth daily.   traMADol 50 MG tablet Commonly known as: Ultram Take 1-2 tablets (50-100 mg total) by mouth every 6 (six) hours as needed.   traZODone 50 MG tablet Commonly known as: DESYREL trazodone 50 mg tablet   Tresiba FlexTouch 200 UNIT/ML Sopn Generic drug: Insulin Degludec Inject 126 Units into the skin daily.       Allergies:  Allergies  Allergen Reactions  . Pregabalin Swelling    Legs and ankles  . Oxycodone Nausea And Vomiting    Other reaction(s): Hallucinations, Vomiting  . Crestor [Rosuvastatin]     Muscle pain  . Doxycycline Nausea Only  . Lipitor [Atorvastatin]     Muscle pain  . Tramadol Anxiety    Other reaction(s): Hallucinations    Family History: Family History  Problem Relation Age of Onset  . Prostate cancer Father   . Bladder Cancer Neg Hx   . Kidney cancer Neg Hx     Social History:  reports that he has never smoked. He has never used smokeless tobacco. He reports that he does not drink alcohol or use drugs.  ROS: UROLOGY Frequent Urination?: Yes Hard to postpone  urination?: No Burning/pain with urination?: No Get up at night to urinate?: Yes Leakage of urine?: No Urine stream starts and stops?: No Trouble starting stream?: No Do you have to strain to urinate?: No Blood in urine?: No Urinary tract infection?: No Sexually transmitted disease?: No Injury to kidneys or bladder?: No Painful intercourse?: No Weak stream?: No Erection problems?: No Penile pain?: No  Gastrointestinal Nausea?: No Vomiting?: No Indigestion/heartburn?: No Diarrhea?: No Constipation?: No  Constitutional Fever: No Night sweats?: No Weight loss?: No Fatigue?: No  Skin Skin rash/lesions?: No Itching?: No  Eyes Blurred vision?: No Double vision?: No  Ears/Nose/Throat Sore throat?: No Sinus problems?: No  Hematologic/Lymphatic Swollen glands?: No Easy bruising?: No  Cardiovascular Leg swelling?: No Chest pain?: No  Respiratory Cough?: No Shortness of breath?: No  Endocrine Excessive  thirst?: No  Musculoskeletal Back pain?: Yes Joint pain?: Yes  Neurological Headaches?: No Dizziness?: No  Psychologic Depression?: No Anxiety?: Yes  Physical Exam: BP (!) 181/96   Pulse 93   Ht 5' 9" (1.753 m)   Wt 250 lb (113.4 kg)   BMI 36.92 kg/m   Constitutional:  Alert and oriented, No acute distress. HEENT: [place] AT, moist mucus membranes.  Trachea midline, no masses. Cardiovascular: No clubbing, cyanosis, or edema. Respiratory: Normal respiratory effort, no increased work of breathing. GI: Abdomen is soft, nontender, nondistended, no abdominal masses GU: 35 g, smooth without nodules Lymph: No cervical or inguinal lymphadenopathy. Skin: No rashes, bruises or suspicious lesions. Neurologic: Grossly intact, no focal deficits, moving all 4 extremities. Psychiatric: Normal mood and affect.   Assessment & Plan:    - Benign prostatic hyperplasia with lower urinary tract symptoms, symptom details unspecified Stable lower urinary tract  symptoms.  PVR by bladder scan today was 0 mL.  Tamsulosin was refilled.  Continue annual follow-up.  - Prostate cancer screening We discussed current recommendations for prostate cancer screening between the ages of 74-69.  He elected to have his PSA drawn today and will be notified with the results.   Abbie Sons, [facility] [address], [place], [place] [address] [phone]

## 2019-02-04 LAB — PSA: Prostate Specific Ag, Serum: 1.4 ng/mL (ref 0.0–4.0)

## 2019-02-08 ENCOUNTER — Telehealth: Payer: Self-pay | Admitting: *Deleted

## 2019-02-08 NOTE — Telephone Encounter (Signed)
-----   Message from Abbie Sons, MD sent at 02/08/2019  9:32 AM EST ----- PSA stable at 1.4.

## 2019-02-08 NOTE — Telephone Encounter (Signed)
Notified patient as instructed, patient pleased °

## 2019-04-07 IMAGING — NM NM HEPATO W/GB/PHARM/[PERSON_NAME]
3 series · 13 of 13 positions shown · non-contrast
Comparison: None.

CLINICAL DATA: Progressive right upper abdominal pain for 1 year.
nausea and vomiting. Diarrhea.

EXAM:
NUCLEAR MEDICINE HEPATOBILIARY IMAGING WITH GALLBLADDER EF
TECHNIQUE: Sequential images of the abdomen were obtained [DATE] minutes
following intravenous administration of radiopharmaceutical. After
oral ingestion of Ensure, gallbladder ejection fraction was
determined. At 60 min, normal ejection fraction is greater than 33%.
RADIOPHARMACEUTICALS:  4.9 mCi Bc-JJm  Choletec IV

[Series 1000: gallbladder delays · 3.30mm/px · 1 of 1 slices shown]
[im 1/1]
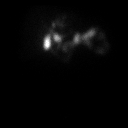

[Series 1000: gallbladder ef · 4.80mm/px · 6 of 120 frames shown]
[frame 11/120]
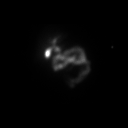
[frame 31/120]
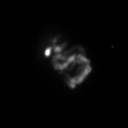
[frame 51/120]
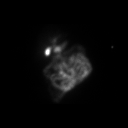
[frame 71/120]
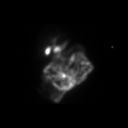
[frame 91/120]
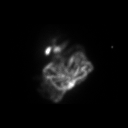
[frame 111/120]
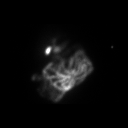

[Series 1000: hepatobiliary scan · 9.59mm/px · 6 of 60 frames shown]
[frame 6/60]
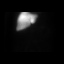
[frame 16/60]
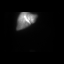
[frame 26/60]
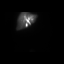
[frame 36/60]
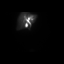
[frame 46/60]
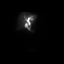
[frame 56/60]
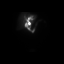

[13 of 13 positions shown; findings below may reference images not displayed]

FINDINGS: Prompt uptake and biliary excretion of activity by the liver is
seen. Gallbladder activity is visualized, consistent with patency of
cystic duct. Biliary activity passes into small bowel, consistent
with patent common bile duct.

Calculated gallbladder ejection fraction is 53%. (Normal gallbladder
ejection fraction with Ensure is greater than 33%.)
IMPRESSION: Normal hepatobiliary scan demonstrates patency of both cystic and
common bile ducts.

Normal gallbladder ejection fraction.

## 2019-07-19 ENCOUNTER — Ambulatory Visit
Admission: RE | Admit: 2019-07-19 | Discharge: 2019-07-19 | Disposition: A | Discharge status: Home / Self Care | Payer: Medicare Other | Attending: Physician Assistant | Admitting: Physician Assistant

## 2019-07-19 ENCOUNTER — Other Ambulatory Visit: Payer: Self-pay | Admitting: Physician Assistant

## 2019-07-19 ENCOUNTER — Ambulatory Visit
Admission: RE | Admit: 2019-07-19 | Discharge: 2019-07-19 | Disposition: A | Discharge status: Home / Self Care | Payer: Medicare Other | Source: Ambulatory Visit | Attending: Physician Assistant | Admitting: Physician Assistant

## 2019-07-19 ENCOUNTER — Other Ambulatory Visit: Payer: Self-pay

## 2019-07-19 DIAGNOSIS — R05 Cough: Secondary | ICD-10-CM | POA: Insufficient documentation

## 2019-07-19 DIAGNOSIS — R059 Cough, unspecified: Secondary | ICD-10-CM

## 2019-08-09 ENCOUNTER — Other Ambulatory Visit: Payer: Self-pay | Admitting: Surgery

## 2019-08-12 ENCOUNTER — Other Ambulatory Visit: Payer: Self-pay

## 2019-08-12 ENCOUNTER — Encounter
Admission: RE | Admit: 2019-08-12 | Discharge: 2019-08-12 | Disposition: A | Discharge status: Home / Self Care | Payer: Medicare Other | Source: Ambulatory Visit | Attending: Surgery | Admitting: Surgery

## 2019-08-12 NOTE — Patient Instructions (Signed)
COVID TESTING Date: August 16, 2019  Testing site:  [facility] Th[facility] Hours:  2:20 am - 1:00 pm Once you are tested, you are asked to stay quarantined (avoiding public places) until after your surgery.   Your procedure is scheduled on: August 18, 2019 Wednesday  Report to Day Surgery on the 2nd floor of the [facility]. To find out your arrival time, please call [phone] between 1PM - 3PM on: August 17, 2019 TUESDAY  REMEMBER: Instructions that are not followed completely may result in serious medical risk, up to and including death; or upon the discretion of your surgeon and anesthesiologist your surgery may need to be rescheduled.  Do not eat food after midnight the night before surgery.  No gum chewing, lozengers or hard candies.  You may however, drink CLEAR liquids up to 2 hours before you are scheduled to arrive for your surgery. Do not drink anything within 2 hours of your scheduled arrival time.  Clear liquids include: - water     Do NOT drink anything that is not on this list.  Type 1 and Type 2 diabetics should only drink water.  GATORADE  DRINK:  Complete drinking 2 hours prior to scheduled arrival time.  TAKE THESE MEDICATIONS THE MORNING OF SURGERY WITH A SIP OF WATER: LEVOTHYROXINE PAROXETINE  FLOMAX NEXIUM (take one the night before and one on the morning of surgery - helps to prevent nausea after surgery.)  Stop Metformin 2 days prior to surgery. LAST DOSE August 15, 2019.  Take 1/2 of usual insulin dose the night before surgery and none on the morning of surgery.  Follow recommendations from Cardiologist, Pulmonologist or PCP regarding stopping Aspirin, Coumadin, Plavix, Eliquis, Pradaxa, or Pletal. STOP ASPIRIN.  Stop Anti-inflammatories (NSAIDS) such as Advil, Aleve, Ibuprofen, Motrin, Naproxen, Naprosyn and Aspirin based products such as Excedrin, Goodys Powder, BC Powder. (May take Tylenol or Acetaminophen if  needed.)  Stop ANY OVER THE COUNTER supplements until after surgery. (May continue Vitamin D, Vitamin B, and multivitamin.)  No Alcohol for 24 hours before or after surgery.  No Smoking including e-cigarettes for 24 hours prior to surgery.  No chewable tobacco products for at least 6 hours prior to surgery.  No nicotine patches on the day of surgery.  Do not use any "recreational" drugs for at least a week prior to your surgery.  Please be advised that the combination of cocaine and anesthesia may have negative outcomes, up to and including death. If you test positive for cocaine, your surgery will be cancelled.  On the morning of surgery brush your teeth with toothpaste and water, you may rinse your mouth with mouthwash if you wish. Do not swallow any toothpaste or mouthwash.  Do not wear jewelry, make-up, hairpins, clips or nail polish.  Do not wear lotions, powders, or perfumes AFTERSHAVE OR DEODORANT  Do not shave 48 hours prior to surgery. MEN CAN SHAVE FACE.  Contact lenses, hearing aids and dentures may not be worn into surgery.  Do not bring valuables to the hospital. [facility] is not responsible for any missing/lost belongings or valuables.   Use CHG Soap as directed on instruction sheet.  Notify your doctor if there is any change in your medical condition (cold, fever, infection).  Wear comfortable clothing (specific to your surgery type) to the hospital.  Plan for stool softeners for home use; pain medications have a tendency to cause constipation. You can also help prevent constipation  by eating foods high in fiber such as fruits and vegetables and drinking plenty of fluids as your diet allows.  After surgery, you can help prevent lung complications by doing breathing exercises.  Take deep breaths and cough every 1-2 hours. Your doctor may order a device called an Incentive Spirometer to help you take deep breaths. When coughing or sneezing, hold a pillow firmly  against your incision with both hands. This is called "splinting." Doing this helps protect your incision. It also decreases belly discomfort.  If you are being discharged the day of surgery, you will not be allowed to drive home. You will need a responsible adult (18 years or older) to drive you home and stay with you that night.   Please call the [facility] at [phone] if you have any questions about these instructions.  Visitation Policy:  Patients undergoing a surgery or procedure may have one family member or support person with them as long as that person is not COVID-19 positive or experiencing its symptoms.  That person may remain in the waiting area during the procedure.  Children under 13 years of age may have both parents or legal guardians with them during their procedure.  Inpatient Visitation Update:   Two designated support people may visit a patient during visiting hours 7 am to 8 pm. It must be the same two designated people for the duration of the patient stay. The visitors may come and go during the day, and there is no switching out to have different visitors. A mask must be worn at all times, including in the patient room.  Children under 25 years of age:  a total of 4 designated visitors for the child's entire stay are allowed. Only 2 in the room at a time and only one staying overnight at a time. The overnight guest can now rotate during the child's hospital stay.  As a reminder, masks are still required for all [facility] team members, patients and visitors in all [facility] facilities.   Systemwide, no visitors 17 or younger.

## 2019-08-16 ENCOUNTER — Encounter
Admission: RE | Admit: 2019-08-16 | Discharge: 2019-08-16 | Disposition: A | Discharge status: Home / Self Care | Payer: Medicare Other | Source: Ambulatory Visit | Attending: Surgery | Admitting: Surgery

## 2019-08-16 ENCOUNTER — Other Ambulatory Visit: Payer: Self-pay

## 2019-08-16 DIAGNOSIS — Z20822 Contact with and (suspected) exposure to covid-19: Secondary | ICD-10-CM | POA: Diagnosis not present

## 2019-08-16 DIAGNOSIS — Z01818 Encounter for other preprocedural examination: Secondary | ICD-10-CM | POA: Diagnosis present

## 2019-08-16 DIAGNOSIS — E118 Type 2 diabetes mellitus with unspecified complications: Secondary | ICD-10-CM | POA: Insufficient documentation

## 2019-08-16 LAB — BASIC METABOLIC PANEL
Anion gap: 7 (ref 5–15)
BUN: 15 mg/dL (ref 8–23)
CO2: 25 mmol/L (ref 22–32)
Calcium: 8.7 mg/dL — ABNORMAL LOW (ref 8.9–10.3)
Chloride: 108 mmol/L (ref 98–111)
Creatinine, Ser: 0.86 mg/dL (ref 0.61–1.24)
GFR calc Af Amer: >60 mL/min (ref >60)
GFR calc non Af Amer: >60 mL/min (ref >60)
Glucose, Bld: 171 mg/dL — ABNORMAL HIGH (ref 70–99)
Potassium: 4.3 mmol/L (ref 3.5–5.1)
Sodium: 140 mmol/L (ref 135–145)

## 2019-08-16 LAB — HEMOGLOBIN: Hemoglobin: 10.9 g/dL — ABNORMAL LOW (ref 13.0–17.0)

## 2019-08-17 LAB — SARS CORONAVIRUS 2 (TAT 6-24 HRS): SARS Coronavirus 2: NEGATIVE

## 2019-08-18 ENCOUNTER — Other Ambulatory Visit: Payer: Self-pay

## 2019-08-18 ENCOUNTER — Ambulatory Visit
Admission: RE | Admit: 2019-08-18 | Discharge: 2019-08-18 | Disposition: A | Discharge status: Home / Self Care | Payer: Medicare Other | Attending: Surgery | Admitting: Surgery

## 2019-08-18 ENCOUNTER — Ambulatory Visit: Payer: Medicare Other

## 2019-08-18 ENCOUNTER — Encounter
Admission: RE | Disposition: A | Discharge status: Home / Self Care | Payer: Self-pay | Source: Home / Self Care | Attending: Surgery

## 2019-08-18 ENCOUNTER — Encounter: Payer: Self-pay | Admitting: Surgery

## 2019-08-18 DIAGNOSIS — Z7982 Long term (current) use of aspirin: Secondary | ICD-10-CM | POA: Insufficient documentation

## 2019-08-18 DIAGNOSIS — Z79890 Hormone replacement therapy (postmenopausal): Secondary | ICD-10-CM | POA: Insufficient documentation

## 2019-08-18 DIAGNOSIS — E7800 Pure hypercholesterolemia, unspecified: Secondary | ICD-10-CM | POA: Diagnosis not present

## 2019-08-18 DIAGNOSIS — I700 Atherosclerosis of aorta: Secondary | ICD-10-CM | POA: Insufficient documentation

## 2019-08-18 DIAGNOSIS — E785 Hyperlipidemia, unspecified: Secondary | ICD-10-CM | POA: Diagnosis not present

## 2019-08-18 DIAGNOSIS — K219 Gastro-esophageal reflux disease without esophagitis: Secondary | ICD-10-CM | POA: Insufficient documentation

## 2019-08-18 DIAGNOSIS — M65311 Trigger thumb, right thumb: Secondary | ICD-10-CM | POA: Diagnosis not present

## 2019-08-18 DIAGNOSIS — Z79899 Other long term (current) drug therapy: Secondary | ICD-10-CM | POA: Insufficient documentation

## 2019-08-18 DIAGNOSIS — E039 Hypothyroidism, unspecified: Secondary | ICD-10-CM | POA: Diagnosis not present

## 2019-08-18 DIAGNOSIS — E538 Deficiency of other specified B group vitamins: Secondary | ICD-10-CM | POA: Diagnosis not present

## 2019-08-18 DIAGNOSIS — G5601 Carpal tunnel syndrome, right upper limb: Secondary | ICD-10-CM | POA: Insufficient documentation

## 2019-08-18 DIAGNOSIS — M65321 Trigger finger, right index finger: Secondary | ICD-10-CM | POA: Insufficient documentation

## 2019-08-18 DIAGNOSIS — E1140 Type 2 diabetes mellitus with diabetic neuropathy, unspecified: Secondary | ICD-10-CM | POA: Insufficient documentation

## 2019-08-18 DIAGNOSIS — M65331 Trigger finger, right middle finger: Secondary | ICD-10-CM | POA: Diagnosis not present

## 2019-08-18 DIAGNOSIS — Z6837 Body mass index (BMI) 37.0-37.9, adult: Secondary | ICD-10-CM | POA: Diagnosis not present

## 2019-08-18 DIAGNOSIS — Z794 Long term (current) use of insulin: Secondary | ICD-10-CM | POA: Diagnosis not present

## 2019-08-18 DIAGNOSIS — I10 Essential (primary) hypertension: Secondary | ICD-10-CM | POA: Insufficient documentation

## 2019-08-18 HISTORY — PX: CARPAL TUNNEL RELEASE: SHX101

## 2019-08-18 LAB — GLUCOSE, CAPILLARY
Glucose-Capillary: 160 mg/dL — ABNORMAL HIGH (ref 70–99)
Glucose-Capillary: 190 mg/dL — ABNORMAL HIGH (ref 70–99)

## 2019-08-18 SURGERY — RELEASE, CARPAL TUNNEL, ENDOSCOPIC
Anesthesia: General | Site: Wrist | Laterality: Right

## 2019-08-18 MED ORDER — SUCCINYLCHOLINE CHLORIDE 200 MG/10ML IV SOSY
PREFILLED_SYRINGE | INTRAVENOUS | Status: AC
  Filled 2019-08-18: qty 10

## 2019-08-18 MED ORDER — SUCCINYLCHOLINE CHLORIDE 20 MG/ML IJ SOLN
INTRAMUSCULAR | Status: DC | PRN
  Administered 2019-08-18: 120 mg via INTRAVENOUS

## 2019-08-18 MED ORDER — FENTANYL CITRATE (PF) 100 MCG/2ML IJ SOLN
INTRAMUSCULAR | Status: AC
  Filled 2019-08-18: qty 2

## 2019-08-18 MED ORDER — ONDANSETRON HCL 4 MG/2ML IJ SOLN
INTRAMUSCULAR | Status: AC
  Filled 2019-08-18: qty 2

## 2019-08-18 MED ORDER — DEXMEDETOMIDINE HCL IN NACL 80 MCG/20ML IV SOLN
INTRAVENOUS | Status: AC
  Filled 2019-08-18: qty 20

## 2019-08-18 MED ORDER — BUPIVACAINE HCL (PF) 0.5 % IJ SOLN
INTRAMUSCULAR | Status: AC
  Filled 2019-08-18: qty 30

## 2019-08-18 MED ORDER — ONDANSETRON HCL 4 MG/2ML IJ SOLN
INTRAMUSCULAR | Status: DC | PRN
  Administered 2019-08-18: 4 mg via INTRAVENOUS

## 2019-08-18 MED ORDER — CEFAZOLIN SODIUM-DEXTROSE 2-4 GM/100ML-% IV SOLN
INTRAVENOUS | Status: AC
  Filled 2019-08-18: qty 100

## 2019-08-18 MED ORDER — CEFAZOLIN SODIUM-DEXTROSE 2-4 GM/100ML-% IV SOLN
2.0000 g | INTRAVENOUS | Status: AC
  Administered 2019-08-18: 2 g via INTRAVENOUS

## 2019-08-18 MED ORDER — PROPOFOL 10 MG/ML IV BOLUS
INTRAVENOUS | Status: DC | PRN
  Administered 2019-08-18: 150 mg via INTRAVENOUS

## 2019-08-18 MED ORDER — DEXAMETHASONE SODIUM PHOSPHATE 10 MG/ML IJ SOLN
INTRAMUSCULAR | Status: AC
  Filled 2019-08-18: qty 1

## 2019-08-18 MED ORDER — LIDOCAINE HCL (PF) 2 % IJ SOLN
INTRAMUSCULAR | Status: AC
  Filled 2019-08-18: qty 5

## 2019-08-18 MED ORDER — ONDANSETRON HCL 4 MG/2ML IJ SOLN: 4.0000 mg | Freq: Four times a day (QID) | INTRAMUSCULAR | Status: DC | PRN

## 2019-08-18 MED ORDER — LIDOCAINE HCL (PF) 0.5 % IJ SOLN
INTRAMUSCULAR | Status: DC | PRN
Start: 2019-08-18 — End: 2019-08-18

## 2019-08-18 MED ORDER — FENTANYL CITRATE (PF) 100 MCG/2ML IJ SOLN: 25.0000 ug | INTRAMUSCULAR | Status: DC | PRN

## 2019-08-18 MED ORDER — BUPIVACAINE HCL (PF) 0.5 % IJ SOLN
INTRAMUSCULAR | Status: DC | PRN
  Administered 2019-08-18: 16 mL

## 2019-08-18 MED ORDER — SCOPOLAMINE 1 MG/3DAYS TD PT72
1.0000 | MEDICATED_PATCH | TRANSDERMAL | Status: DC
  Administered 2019-08-18: 1.5 mg via TRANSDERMAL

## 2019-08-18 MED ORDER — ONDANSETRON HCL 4 MG PO TABS: 4.0000 mg | ORAL_TABLET | Freq: Four times a day (QID) | ORAL | Status: DC | PRN

## 2019-08-18 MED ORDER — ROCURONIUM BROMIDE 100 MG/10ML IV SOLN
INTRAVENOUS | Status: DC | PRN
  Administered 2019-08-18: 10 mg via INTRAVENOUS
  Administered 2019-08-18: 20 mg via INTRAVENOUS

## 2019-08-18 MED ORDER — PHENYLEPHRINE HCL (PRESSORS) 10 MG/ML IV SOLN
INTRAVENOUS | Status: DC | PRN
  Administered 2019-08-18 (×4): 100 ug via INTRAVENOUS

## 2019-08-18 MED ORDER — SCOPOLAMINE 1 MG/3DAYS TD PT72
MEDICATED_PATCH | TRANSDERMAL | Status: AC
  Filled 2019-08-18: qty 1

## 2019-08-18 MED ORDER — CHLORHEXIDINE GLUCONATE 0.12 % MT SOLN: 15.0000 mL | Freq: Once | OROMUCOSAL | Status: AC

## 2019-08-18 MED ORDER — DEXAMETHASONE SODIUM PHOSPHATE 10 MG/ML IJ SOLN
INTRAMUSCULAR | Status: DC | PRN
  Administered 2019-08-18: 5 mg via INTRAVENOUS

## 2019-08-18 MED ORDER — LIDOCAINE HCL (CARDIAC) PF 100 MG/5ML IV SOSY
PREFILLED_SYRINGE | INTRAVENOUS | Status: DC | PRN
  Administered 2019-08-18: 40 mg via INTRAVENOUS
  Administered 2019-08-18: 60 mg via INTRAVENOUS

## 2019-08-18 MED ORDER — DEXMEDETOMIDINE HCL IN NACL 400 MCG/100ML IV SOLN
INTRAVENOUS | Status: DC | PRN
  Administered 2019-08-18: 12 ug via INTRAVENOUS
  Administered 2019-08-18: 8 ug via INTRAVENOUS

## 2019-08-18 MED ORDER — SODIUM CHLORIDE 0.9 % IV SOLN: INTRAVENOUS | Status: DC

## 2019-08-18 MED ORDER — ONDANSETRON HCL 4 MG/2ML IJ SOLN: 4.0000 mg | Freq: Once | INTRAMUSCULAR | Status: DC | PRN

## 2019-08-18 MED ORDER — FENTANYL CITRATE (PF) 100 MCG/2ML IJ SOLN
INTRAMUSCULAR | Status: DC | PRN
  Administered 2019-08-18: 50 ug via INTRAVENOUS
  Administered 2019-08-18: 25 ug via INTRAVENOUS
  Administered 2019-08-18: 50 ug via INTRAVENOUS

## 2019-08-18 MED ORDER — KETOROLAC TROMETHAMINE 30 MG/ML IJ SOLN
INTRAMUSCULAR | Status: AC
  Filled 2019-08-18: qty 1

## 2019-08-18 MED ORDER — ORAL CARE MOUTH RINSE: 15.0000 mL | Freq: Once | OROMUCOSAL | Status: AC

## 2019-08-18 MED ORDER — SUGAMMADEX SODIUM 200 MG/2ML IV SOLN
INTRAVENOUS | Status: DC | PRN
  Administered 2019-08-18: 200 mg via INTRAVENOUS

## 2019-08-18 MED ORDER — CHLORHEXIDINE GLUCONATE 0.12 % MT SOLN
OROMUCOSAL | Status: AC
  Administered 2019-08-18: 15 mL via OROMUCOSAL
  Filled 2019-08-18: qty 15

## 2019-08-18 MED ORDER — ROCURONIUM BROMIDE 10 MG/ML (PF) SYRINGE
PREFILLED_SYRINGE | INTRAVENOUS | Status: AC
  Filled 2019-08-18: qty 10

## 2019-08-18 MED ORDER — METOCLOPRAMIDE HCL 10 MG PO TABS: 5.0000 mg | ORAL_TABLET | Freq: Three times a day (TID) | ORAL | Status: DC | PRN

## 2019-08-18 MED ORDER — METOCLOPRAMIDE HCL 5 MG/ML IJ SOLN: 5.0000 mg | Freq: Three times a day (TID) | INTRAMUSCULAR | Status: DC | PRN

## 2019-08-18 SURGICAL SUPPLY — 32 items
APL PRP STRL LF DISP 70% ISPRP (MISCELLANEOUS) ×1
BNDG COHESIVE 4X5 TAN STRL (GAUZE/BANDAGES/DRESSINGS) ×3 IMPLANT
BNDG ELASTIC 2X5.8 VLCR STR LF (GAUZE/BANDAGES/DRESSINGS) ×3 IMPLANT
BNDG ESMARK 4X12 TAN STRL LF (GAUZE/BANDAGES/DRESSINGS) ×3 IMPLANT
CANISTER SUCT 1200ML W/VALVE (MISCELLANEOUS) ×3 IMPLANT
CHLORAPREP W/TINT 26 (MISCELLANEOUS) ×3 IMPLANT
CORD BIP STRL DISP 12FT (MISCELLANEOUS) ×3 IMPLANT
COVER WAND RF STERILE (DRAPES) ×3 IMPLANT
CUFF TOURN SGL QUICK 18X4 (TOURNIQUET CUFF) ×3 IMPLANT
DRAPE SURG 17X11 SM STRL (DRAPES) ×3 IMPLANT
FORCEPS JEWEL BIP 4-3/4 STR (INSTRUMENTS) ×3 IMPLANT
GAUZE SPONGE 4X4 12PLY STRL (GAUZE/BANDAGES/DRESSINGS) ×3 IMPLANT
GAUZE XEROFORM 1X8 LF (GAUZE/BANDAGES/DRESSINGS) ×3 IMPLANT
GLOVE BIO SURGEON STRL SZ8 (GLOVE) ×3 IMPLANT
GLOVE INDICATOR 8.0 STRL GRN (GLOVE) ×3 IMPLANT
GOWN STRL REUS W/ TWL LRG LVL3 (GOWN DISPOSABLE) ×1 IMPLANT
GOWN STRL REUS W/ TWL XL LVL3 (GOWN DISPOSABLE) ×1 IMPLANT
GOWN STRL REUS W/TWL LRG LVL3 (GOWN DISPOSABLE) ×3
GOWN STRL REUS W/TWL XL LVL3 (GOWN DISPOSABLE) ×3
KIT CARPAL TUNNEL (MISCELLANEOUS) ×3
KIT ESCP INSRT D SLOT CANN KN (MISCELLANEOUS) ×1 IMPLANT
KIT TURNOVER KIT A (KITS) ×3 IMPLANT
NS IRRIG 500ML POUR BTL (IV SOLUTION) ×6 IMPLANT
PACK EXTREMITY (MISCELLANEOUS) ×3 IMPLANT
SPLINT WRIST LG LT TX990309 (SOFTGOODS) IMPLANT
SPLINT WRIST LG RT TX900304 (SOFTGOODS) IMPLANT
SPLINT WRIST M LT TX990308 (SOFTGOODS) IMPLANT
SPLINT WRIST M RT TX990303 (SOFTGOODS) IMPLANT
SPLINT WRIST XL LT TX990310 (SOFTGOODS) IMPLANT
SPLINT WRIST XL RT TX990305 (SOFTGOODS) ×3 IMPLANT
STOCKINETTE IMPERVIOUS 9X36 MD (GAUZE/BANDAGES/DRESSINGS) ×3 IMPLANT
SUT PROLENE 4 0 PS 2 18 (SUTURE) ×6 IMPLANT

## 2019-08-18 NOTE — Anesthesia Preprocedure Evaluation (Addendum)
Anesthesia Evaluation  Patient identified by MRN, date of birth, ID band Patient awake    Reviewed: Allergy & Precautions, H&P , NPO status , Patient's Chart, lab work & pertinent test results  History of Anesthesia Complications (+) PONV and history of anesthetic complications  Airway Mallampati: III  TM Distance: <3 FB Neck ROM: full    Dental no notable dental hx.    Pulmonary neg pulmonary ROS,    Pulmonary exam normal        Cardiovascular hypertension, Normal cardiovascular exam     Neuro/Psych  Neuromuscular disease negative psych ROS   GI/Hepatic Neg liver ROS, GERD  ,  Endo/Other  diabetesHypothyroidism Morbid obesity  Renal/GU negative Renal ROS  negative genitourinary   Musculoskeletal   Abdominal   Peds negative pediatric ROS (+)  Hematology   Anesthesia Other Findings Past Medical History: No date: Diabetes mellitus without complication ([place]) No date: Diabetic neuropathy ([place]) 2018: Fall from ladder     Comment:  LANDED ON CONCRETE No date: GERD (gastroesophageal reflux disease) No date: Hypercholesteremia No date: Hypertension No date: Hypothyroidism No date: PONV (postoperative nausea and vomiting) No date: Wears contact lenses  Reproductive/Obstetrics                            Anesthesia Physical  Anesthesia Plan  ASA: II  Anesthesia Plan:    Post-op Pain Management:    Induction: Intravenous  PONV Risk Score and Plan:   Airway Management Planned: Oral ETT and Video Laryngoscope Planned  Additional Equipment:   Intra-op Plan:   Post-operative Plan: Extubation in OR  Informed Consent: I have reviewed the patients History and Physical, chart, labs and discussed the procedure including the risks, benefits and alternatives for the proposed anesthesia with the patient or authorized representative who has indicated his/her understanding and acceptance.        Plan Discussed with: CRNA and Surgeon  Anesthesia Plan Comments:        Anesthesia Quick Evaluation

## 2019-08-18 NOTE — Op Note (Signed)
08/18/2019  11:44 AM  Patient:   Nicholas Humphrey  Pre-Op Diagnosis:   1.  Right carpal tunnel syndrome.  2.  Right thumb, index, and long trigger fingers.  Post-Op Diagnosis:   Same.  Procedure:   1.  Endoscopic right carpal tunnel release.  2.  Release of right thumb, index, and long trigger fingers.  Surgeon:   Pascal Lux, MD  Assistant:   Gabrielle Dare, PA-S  Anesthesia:   GET  Findings:   As above.  Complications:   None  EBL:   0 cc  Fluids:   650 cc crystalloid  TT:   48 minutes at 250 mmHg  Drains:   None  Closure:   4-0 Prolene interrupted sutures  Brief Clinical Note:   The patient is a 69 year old male with a history of progressive worsening pain and paresthesias to his right hand. His symptoms have persisted despite medications, activity modification, splinting, etc. His history and examination are consistent with carpal tunnel syndrome, confirmed by EMG. The patient presents at this time for an endoscopic right carpal tunnel release. The patient also notes painful recurrent catching of the right thumb, index, and long fingers, consistent with trigger fingers. The patient presents at this time for release of these three trigger fingers as well.  Procedure:   The patient was brought into the operating room and lain in the supine position. After adequate general endotracheal intubation and anesthesia was obtained, the left hand and upper extremity were prepped with ChloraPrep solution before being draped sterilely. Preoperative antibiotics were administered. A timeout was performed to verify the appropriate surgical site before the limb was exsanguinated with an Esmarch and the tourniquet inflated to 250 mmHg. An approximately 1.5-2 cm incision was made over the volar wrist flexion crease, centered over the palmaris longus tendon. The incision was carried down through the subcutaneous tissues with care taken to identify and protect any neurovascular structures.  The distal forearm fascia was penetrated just proximal to the transverse carpal ligament. The soft tissues were released off the superficial and deep surfaces of the distal forearm fascia and this was released proximally for 3-4 cm under direct visualization.  Attention was directed distally. The Soil scientist was passed beneath the transverse carpal ligament along the ulnar aspect of the carpal tunnel and used to release any adhesions as well as to remove any adherent synovial tissue before first the smaller then the larger of the two dilators were passed beneath the transverse carpal ligament along the ulnar margin of the carpal tunnel. The slotted cannula was introduced and the endoscope was placed into the slotted cannula and the undersurface of the transverse carpal ligament visualized. The distal margin of the transverse carpal ligament was marked by placing a 25-gauge needle percutaneously at [place] cardinal point so that it entered the distal portion of the slotted cannula. Under endoscopic visualization, the transverse carpal ligament was released from proximal to distal using the end-cutting blade. A second pass was performed to ensure complete release of the ligament. The adequacy of release was verified both endoscopically and by palpation using the freer elevator.  Attention was directed to the long finger. An approximately 1.5-2.0 cm incision was made over the volar aspect of the long finger at the level of the metacarpal head centered over the flexor sheath. The incision was carried down through the subcutaneous tissues with care taken to identify and protect the digital neurovascular structures. The flexor sheath was entered just proximal to the A1 pulley.  The sheath was released proximally for several centimeters under direct visualization. Distally, a clamp was placed beneath the A1 pulley and used to release any adhesions. The clamp was repositioned so that one jaw was superficial to and  the other jaw deep to the A1 pulley. The A1 pulley was incised on either side of the clamp to remove a 2 mm strip of tissue. Metzenbaum scissors were used to ensure complete release of the A1 pulley more distally. The underlying tendons were carefully inspected and found to be intact.   Attention next was directed to the index finger. An approximately 1.5-2.0 cm incision was made over the volar aspect of the index finger at the level of the metacarpal head centered over the flexor sheath. The incision was carried down through the subcutaneous tissues with care taken to identify and protect the digital neurovascular structures. The flexor sheath was entered just proximal to the A1 pulley. The sheath was released proximally for several centimeters under direct visualization. Distally, a clamp was placed beneath the A1 pulley and used to release any adhesions. The clamp was repositioned so that one jaw was superficial to and the other jaw deep to the A1 pulley. The A1 pulley was incised on either side of the clamp to remove a 2 mm strip of tissue. Metzenbaum scissors were used to ensure complete release of the A1 pulley more distally. The underlying tendons were carefully inspected and found to be intact.   Finally, the thumb was addressed. An approximately 1.5-2.0 cm incision was made over the volar aspect of the thumb at the level of the metacarpal head centered over the flexor sheath. The incision was carried down through the subcutaneous tissues with care taken to identify and protect the digital neurovascular structures. The flexor sheath was entered just proximal to the A1 pulley. The sheath was released proximally for several centimeters under direct visualization. Distally, a clamp was placed beneath the A1 pulley and used to release any adhesions. The clamp was repositioned so that one jaw was superficial to and the other jaw deep to the A1 pulley. The A1 pulley was incised on either side of the clamp to  remove a 2 mm strip of tissue. Metzenbaum scissors were used to ensure complete release of the A1 pulley more distally. The underlying tendons were carefully inspected and found to be intact.   The wounds were irrigated thoroughly with sterile saline solution before each wound was closed using 4-0 Prolene interrupted sutures. A total of 15 cc of 0.5% plain Sensorcaine was injected in and around the incisions before a sterile bulky dressing was applied to the hand. The patient was placed into a volar wrist splint before being awakened, extubated, and returned to the recovery room in satisfactory condition after tolerating the procedure well.

## 2019-08-18 NOTE — H&P (Signed)
History of Present Illness:  Nicholas Humphrey. is a 69 y.o. male that presents to clinic today for his preoperative history and evaluation. Patient presents unaccompanied. The patient is scheduled to undergo a right carpal tunnel release with trigger finger release of the right long finger on 08/18/19 by Dr. Roland Rack. The patient reports a long history of pain and paresthesias in the right hand. Patient has previously undergone an EMG in December 2020 which confirmed the presence of "chronic, severe right carpal tunnel syndrome on a superimposed mild generalized polyneuropathy." Patient also has a history of painful catching of his right long finger. He denies any specific injury to the finger or hand.  The patient's symptoms have progressed to the point that they decrease his quality of life. The patient has previously undergone conservative treatment including NSAIDS and activity modification without adequate control of his symptoms.  Past Medical History:  . Acquired hypothyroidism, unspecified  . Aortic atherosclerosis (CMS-HCC) 06/2019  Incidentally noted on CXR.  . B12 deficiency 2019  . BPH (benign prostatic hyperplasia) 08/2016  Incidentially noted on MRI of hip w/ exophytic nodule 2.5 cm at Rt peripheral base.  . Carpal tunnel syndrome of right wrist 12/2018  Severe based on EMG w/ a mixed polyneuropathy as well.  . Cataract cortical, senile  . DJD (degenerative joint disease) of cervical spine 09/2016  Found on trauma films. Severe DJD c-spin most pronounced at C4-5.  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 Multilevel DJD thoracic & lumbar.  . Elbow dislocation 10/04/2016  2/2 fall off ladder & requiring surgical repair. Lt elbow posterolateral disclocation w/ radial fx.  . Fatty liver  Noted incidentally on ULS  . GERD (gastroesophageal reflux disease)  . Hammertoe  with capsulitis, right foot  . Hyperlipidemia  . Hypertension  . Mastoid  disorder, left 09/2016  Incidental finding on trauma films--focal erosion Lt mastoid w/ soft tissue defect. Consult to ENT [facility] by [facility].  . Microalbuminuric diabetic nephropathy (CMS-HCC)  . Neuropathy associated with endocrine disorder (CMS-HCC)  . Obesity (BMI 30-39.9), unspecified  . Pleural thickening 06/2019  Incidentally found on CXR for chronic cough: Mild apical pleural thickening.  Marland Kitchen PONV (postoperative nausea and vomiting)  . Radial fracture 10/04/2016  2/2 fall off ladder. Requiring surgical repair. Type 1 open proximal radial head/neck fx, also comminuted depressed Lt intraarticular distal radius fx, as well as minimally displaced Lt superior glenoid rim fx & Lt elbow dislocation.  . Situational anxiety 2018  . Tear of medial meniscus of knee 07/20/2018  . Type 2 diabetes mellitus (CMS-HCC) dx ~ 1994   Past Surgical History:  . APPENDECTOMY  . ARTHROSCOPIC ROTATOR CUFF REPAIR Right 2013  . ARTHROTOMY ELBOW Left 06/08/2017  Arthrotomy/capsule release. Done at [facility].  Marland Kitchen COLONOSCOPY 03/09/2012  . HERNIA REPAIR Right  Inguinal  . KNEE ARTHROSCOPY Left 2012  . LAMINECTOMY LUMBAR SPINE  . Left knee arthroscopy and partial medial meniscectomy 08/17/10  . Left knee meniscus repair  . ORIF DISTAL RADIUS FRACTURE Left 10/05/2016  Open tx of distal radial intra-articular fx/epiphseal separation; w/ internal fex 3+ fragments. Done at [facility]. Type 1 open proximal radial head/neck fx, also comminuted depressed Lt intraarticular distal radius fx, as well as minimally displaced superior glenoid rim fx 2/2 fall from ladder.  . ORIF ELBOW FRACTURE Left 10/05/2016  open tx periarticular fx &/or disclocation elbow. Done at [facility]. Lt elbow posterolateral dislocation 2/2 fall off ladder.  . Release right ring trigger  finger Right 10/04/2015  Dr.Desani Sprung  . TYMPANOPLASTY Right   Current Medications:  . acetaminophen (TYLENOL) 500 MG tablet Take 1,000 mg by mouth every 6 (six) hours as needed for Pain   . ascorbic acid, vitamin C, (VITAMIN C) 500 MG tablet Take 500 mg by mouth once daily  . aspirin 81 MG EC tablet Take 81 mg by mouth once daily.  . cyanocobalamin (VITAMIN B-12) 1000 MCG tablet Take 1 tablet (1,000 mcg total) by mouth once daily 90 tablet 3  . ELDERBERRY FRUIT ORAL Take 1,000 mg by mouth once daily  . enalapril (VASOTEC) 5 MG tablet TAKE (1) TABLET BY MOUTH EVERY DAY 90 tablet 1  . esomeprazole (NEXIUM) 40 MG DR capsule Take 1 capsule (40 mg total) by mouth once daily 90 capsule 1  . flash glucose scanning reader Misc Use 1 kit as directed 1 each 0  . flash glucose sensor (FREESTYLE LIBRE 14 DAY SENSOR) kit Apply 1 sensor to skin every 14 days for glucose monitoring. 6 each 4  . insulin ASPART (NOVOLOG FLEXPEN) pen injector (concentration 100 units/mL) Inject 6 units before breakfast, 25 units before lunch and 45 units before dinner as directed.  . insulin DEGLUDEC (TRESIBA FLEXTOUCH U-200) pen injector (concentration 200 units/mL) Inject 120 Units subcutaneously once daily for 180 days 54 mL 1  . insulin LISPRO (HUMALOG KWIKPEN) pen injector (concentration 100 units/mL) INJECT 6 UNITS UNDER THE SKIN BEFORE BREAKFAST, 25 UNITS BEFORE LUNCH AND 45 UNITS BEFORE DINNER AS DIRECTED 68.4 mL 1  . levothyroxine (SYNTHROID) 137 MCG tablet Take on an empty stomach with a glass of water at least 30-60 minutes before breakfast. 90 tablet 1  . metFORMIN (GLUCOPHAGE) 1000 MG tablet 1 tablet in the am and 1/2 tablet in the pm 225 tablet 1  . montelukast (SINGULAIR) 10 mg tablet Take 1 tablet (10 mg total) by mouth nightly 90 tablet 1  . multivitamin tablet Take 1 tablet by mouth once daily.  Marland Kitchen PARoxetine (PAXIL) 20 MG tablet TAKE (1) TABLET BY MOUTH EVERY DAY 90 tablet 1  . pen needle, diabetic (BD ULTRA-FINE SHORT PEN NEEDLE) 31 gauge x 5/16" needle Attach a single use needle four times daily with NovoLog and Tresiba pens. 400 each 1  . pioglitazone (ACTOS) 45 MG tablet Take 1 tablet (45 mg  total) by mouth every evening With supper 90 tablet 1  . simvastatin (ZOCOR) 40 MG tablet Take 1 tablet (40 mg total) by mouth nightly 90 tablet 1  . tamsulosin (FLOMAX) 0.4 mg capsule TAKE (1) CAPSULE BY MOUTH EVERY DAY 90 capsule 1   No current facility-administered medications for this visit.   Allergies:  . Crestor [Rosuvastatin] Muscle Pain  . Lipitor [Atorvastatin] Muscle Pain  . Oxycodone Hallucination and Vomiting  . Tramadol Anxiety, Hallucination and Vomiting  . Doxycycline Nausea and Dizziness  . Pregabalin Swelling  Legs and ankles   Social History:   Socioeconomic History  . Marital status: Married  Spouse name: Ivin Booty  . Number of children: Not on file  . Years of education: Not on file  . Highest education level: Not on file  Occupational History  . Occupation: Not working due to injuries  Tobacco Use  . Smoking status: Never Smoker  . Smokeless tobacco: Never Used  Vaping Use  . Vaping Use: Never used  Substance and Sexual Activity  . Alcohol use: No  Alcohol/week: 0.0 standard drinks  . Drug use: No  . Sexual activity: Not Currently  Partners:  Female  Birth control/protection: None  Other Topics Concern  . 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   Social Determinants of Health   Financial Resource Strain:  . Difficulty of Paying Living Expenses:  Food Insecurity:  . Worried About Charity fundraiser in the Last Year:  . Arboriculturist in the Last Year:  Transportation Needs:  . Film/video editor (Medical):  Marland Kitchen Lack of Transportation (Non-Medical):  Physical Activity:  . Days of Exercise per Week:  . Minutes of Exercise per Session:  Stress:  . Feeling of Stress :  Social Connections:  . Frequency of Communication with Friends and Family:  . Frequency of Social Gatherings with Friends and Family:  . Attends Religious Services:  .  Active Member of Clubs or Organizations:  . Attends Archivist Meetings:  Marland Kitchen Marital Status:   Family History:  . Breast cancer Mother  . Stroke Mother  . Diabetes type II Father  . Thyroid disease Sister  . Diabetes type II Other  . High blood pressure (Hypertension) Other  . Heart failure Other  . Other Other  cerebrovascular disease, degenerative joint disease  . Prostate cancer Other   Review of Systems:  A 10+ ROS was performed, reviewed, and the pertinent orthopaedic findings are documented in the HPI.   Physical Examination:  BP 150/80  Ht 172.7 cm (_0 )  Wt (!) 117.9 kg (260 lb)  BMI 39.53 kg/m   Patient is a well-developed, well-nourished male in no acute distress. Patient has normal mood and affect. Patient is alert and oriented to person, place, and time.   HEENT: Atraumatic, normocephalic. Pupils equal and reactive to light. Extraocular motion intact. Noninjected sclera.  Cardiovascular: Regular rate and rhythm, with no murmurs, rubs, or gallops. Distal pulses palpable.  Respiratory: Lungs clear to auscultation bilaterally.   Right wrist/hand exam: Skin inspection of the right hand is unremarkable. No swelling, erythema, ecchymosis, abrasions, or other skin abnormalities are identified. He exhibits near full active and passive range of motion of the wrist without any pain or catching. He is able to actively flex and extend all digits without any pain or triggering on today's visit. He has mild focal tenderness palpation over the palmar aspect of his hand in the area of the long metacarpal head, but no obvious masses palpable in this area. He is neurovascularly intact to all digits. He has a negative Phalen's test and a negative Tinel's test.  Tests Performed/Reviewed:  No new radiographs were obtained today.   Impression:  1. Trigger middle finger of right hand.  2. Carpal tunnel syndrome, right.   Plan:  The patient will benefit from a right carpal  tunnel release as well as a trigger finger release of the right long finger. The risks of surgery, including infection and blood clots, were discussed with the patient. Having failed conservative treatment, the patient has elected to proceed with surgery. The postoperative course was discussed with the patient. Patient was instructed to stop all blood thinners prior to surgery. Patient understands and elects to proceed with surgery.   H&P reviewed and patient re-examined. No changes other than the fact that the patient is now noting painful catching of the thumb and index fingers as well. A brief bedside examination demonstrates the presence of early trigger fingers involving these two digits. The consent has been amended to  add release of the thumb and index trigger fingers to the present procedure as requested by the patient.

## 2019-08-18 NOTE — Transfer of Care (Signed)
Immediate Anesthesia Transfer of Care Note  Patient: Nicholas Humphrey  Procedure(s) Performed: ENDOSCOPIC RIGHT CARPAL TUNNEL RELEASE , RELEASE RIGHT LONG TRIGGER FINGER. INDEX TRIGGER AND TRIGGER THUMB RELEASE (Right Wrist)  Patient Location: PACU  Anesthesia Type:General  Level of Consciousness: awake and patient cooperative  Airway & Oxygen Therapy: Patient Spontanous Breathing and Patient connected to face mask oxygen  Post-op Assessment: Report given to RN and Post -op Vital signs reviewed and stable  Post vital signs: Reviewed and stable  Last Vitals:  Vitals Value Taken Time  BP 177/64 08/18/19 1152  Temp 36.9 C 08/18/19 1152  Pulse 89 08/18/19 1154  Resp 18 08/18/19 1154  SpO2 100 % 08/18/19 1154  Vitals shown include unvalidated device data.  Last Pain:  Vitals:   08/18/19 1152  TempSrc:   PainSc: (P) Asleep         Complications: No complications documented.

## 2019-08-18 NOTE — Progress Notes (Signed)
Pt CBG: 190. Dr. Kayleen Memos notified. Acknowledged. No new orders at this time. Pt to take his home medications.

## 2019-08-18 NOTE — Discharge Instructions (Addendum)
AMBULATORY SURGERY  DISCHARGE INSTRUCTIONS   1) The drugs that you were given will stay in your system until tomorrow so for the next 24 hours you should not:  A) Drive an automobile B) Make any legal decisions C) Drink any alcoholic beverage   2) You may resume regular meals tomorrow.  Today it is better to start with liquids and gradually work up to solid foods.  You may eat anything you prefer, but it is better to start with liquids, then soup and crackers, and gradually work up to solid foods.   3) Please notify your doctor immediately if you have any unusual bleeding, trouble breathing, redness and pain at the surgery site, drainage, fever, or pain not relieved by medication.    4) Additional Instructions:        Please contact your physician with any problems or Same Day Surgery at [phone], Monday through Friday 6 am to 4 pm, or [place] at [facility] number at [phone].    Orthopedic discharge instructions: Keep dressing dry and intact. Keep hand elevated above heart level. May shower after dressing removed on postop day 4 (Monday). Cover sutures with Band-Aids after drying off. Apply ice to affected area frequently. Take ibuprofen 600 mg TID with meals for 7-10 days, then as necessary. Take ES Tylenol or pain medication as prescribed when needed.  Return for follow-up in 10-14 days or as scheduled.

## 2019-08-18 NOTE — Anesthesia Postprocedure Evaluation (Signed)
Anesthesia Post Note  Patient: Nicholas Humphrey  Procedure(s) Performed: ENDOSCOPIC RIGHT CARPAL TUNNEL RELEASE , RELEASE RIGHT LONG TRIGGER FINGER. INDEX TRIGGER AND TRIGGER THUMB RELEASE (Right Wrist)  Patient location during evaluation: PACU Anesthesia Type: General Level of consciousness: awake and alert and oriented Pain management: pain level controlled Vital Signs Assessment: post-procedure vital signs reviewed and stable Respiratory status: spontaneous breathing Cardiovascular status: blood pressure returned to baseline Anesthetic complications: no   No complications documented.   Last Vitals:  Vitals:   08/18/19 1222 08/18/19 1235  BP: (!) 134/64 (!) 171/67  Pulse: 78 82  Resp: 14 16  Temp: 36.9 C 36.4 C  SpO2: 97% 95%    Last Pain:  Vitals:   08/18/19 1235  TempSrc: Temporal  PainSc: 0-No pain                 Acsa Estey

## 2019-08-18 NOTE — Anesthesia Procedure Notes (Signed)
Procedure Name: Intubation Date/Time: 08/18/2019 10:31 AM Performed by: Nolon Lennert, RN Pre-anesthesia Checklist: Patient identified, Patient being monitored, Timeout performed, Emergency Drugs available and Suction available Patient Re-evaluated:Patient Re-evaluated prior to induction Oxygen Delivery Method: Circle system utilized Preoxygenation: Pre-oxygenation with 100% oxygen Induction Type: IV induction Ventilation: Mask ventilation without difficulty and Two handed mask ventilation required Laryngoscope Size: 3 and McGraph Grade View: Grade I Tube type: Oral Tube size: 7.5 mm Number of attempts: 1 Airway Equipment and Method: Stylet and Video-laryngoscopy Placement Confirmation: ETT inserted through vocal cords under direct vision,  positive ETCO2 and breath sounds checked- equal and bilateral Secured at: 22 cm Tube secured with: Tape Dental Injury: Teeth and Oropharynx as per pre-operative assessment

## 2019-08-19 ENCOUNTER — Encounter: Payer: Self-pay | Admitting: Surgery

## 2019-10-14 ENCOUNTER — Encounter: Admission: RE | Payer: Self-pay | Source: Home / Self Care

## 2019-10-14 ENCOUNTER — Ambulatory Visit: Admission: RE | Admit: 2019-10-14 | Payer: Medicare Other | Source: Home / Self Care | Admitting: Internal Medicine

## 2019-10-14 SURGERY — COLONOSCOPY
Anesthesia: General

## 2020-01-28 DIAGNOSIS — J301 Allergic rhinitis due to pollen: Secondary | ICD-10-CM | POA: Diagnosis not present

## 2020-02-03 ENCOUNTER — Encounter: Payer: Self-pay | Admitting: Urology

## 2020-02-03 ENCOUNTER — Other Ambulatory Visit: Payer: Self-pay

## 2020-02-03 ENCOUNTER — Ambulatory Visit (INDEPENDENT_AMBULATORY_CARE_PROVIDER_SITE_OTHER): Payer: HMO | Admitting: Urology

## 2020-02-03 VITALS — BP 153/78 | HR 88 | Ht 70.0 in | Wt 252.0 lb

## 2020-02-03 DIAGNOSIS — N401 Enlarged prostate with lower urinary tract symptoms: Secondary | ICD-10-CM

## 2020-02-03 NOTE — Progress Notes (Unsigned)
02/03/2020 11:31 AM   Micah Flesher 1950/06/30 [id number]  Referring provider: Sallee Lange, NP [address] [place],  [place] [address]  Chief Complaint  Patient presents with  . Follow-up    Urologic history: 1.  BPH with lower urinary tract symptoms -Episode urinary retention after back surgery 2019 which resolved -Tamsulosin  HPI: 70 y.o. male presents for annual follow-up.   Stable lower urinary tract symptoms since last year  Remains on tamsulosin  Denies dysuria, gross hematuria  Denies flank, abdominal or pelvic pain   PMH: Past Medical History:  Diagnosis Date  . Diabetes mellitus without complication ([facility])   . Diabetic neuropathy ([place])   . Fall from ladder 2018   LANDED ON CONCRETE  . GERD (gastroesophageal reflux disease)   . Hypercholesteremia   . Hypertension   . Hypothyroidism   . PONV (postoperative nausea and vomiting)   . Wears contact lenses     Surgical History: Past Surgical History:  Procedure Laterality Date  . APPENDECTOMY    . BACK SURGERY  2019  . CARPAL TUNNEL RELEASE Right 08/18/2019   Procedure: ENDOSCOPIC RIGHT CARPAL TUNNEL RELEASE , RELEASE RIGHT LONG TRIGGER FINGER. INDEX TRIGGER AND TRIGGER THUMB RELEASE;  Surgeon: Corky Mull, MD;  Location: [facility];  Service: Orthopedics;  Laterality: Right;  . ELBOW SURGERY Left 2018   X2  . FRACTURE SURGERY Left    SEVERAL BROKEN BONES AFTER FALL  . HAND SURGERY Left 2018   HARDWARE REMOVED  . HAND SURGERY Left 2018   HARDWARE AFTER FALL  . INGUINAL HERNIA REPAIR Right   . KNEE ARTHROSCOPY W/ PARTIAL MEDIAL MENISCECTOMY Left 08/17/2010  . LUMBAR LAMINECTOMY    . ROTATOR CUFF REPAIR Right 2013  . TRIGGER FINGER RELEASE Right 10/04/2015   Procedure: RELEASE TRIGGER FINGER/A-1 PULLEY;  Surgeon: Corky Mull, MD;  Location: [facility];  Service: Orthopedics;  Laterality: Right;  PREFER BIER BLOCK Diabetic - insulin and oral meds  . TYMPANOPLASTY Right      Home Medications:  Allergies as of 02/03/2020      Reactions   Pregabalin Swelling   Legs and ankles   Oxycodone Nausea And Vomiting   Other reaction(s): Hallucinations, Vomiting   Crestor [rosuvastatin]    Muscle pain   Doxycycline Nausea Only   Lipitor [atorvastatin]    Muscle pain   Tramadol Anxiety   Other reaction(s): Hallucinations      Medication List       Accurate as of February 03, 2020 11:31 AM. If you have any questions, ask your nurse or doctor.        aspirin 81 MG tablet Take 81 mg by mouth daily.   enalapril 5 MG tablet Commonly known as: VASOTEC Take 5 mg by mouth daily.   esomeprazole 40 MG capsule Commonly known as: NEXIUM Take 40 mg by mouth every morning.   FreeStyle [employer] Use 1 kit as directed   [employer] Use 1 each every 14 (fourteen) days   furosemide 20 MG tablet Commonly known as: LASIX Take by mouth.   gabapentin 300 MG capsule Commonly known as: NEURONTIN Take by mouth.   glucose blood test strip Use 1 strip via meter 3-4 times daily as directed in a rotating pattern before a meal, 2 hours after a meal or bedtime.   hydrochlorothiazide 50 MG tablet Commonly known as: HYDRODIURIL Take 50 mg by mouth daily.   insulin aspart 100 UNIT/ML  injection Commonly known as: novoLOG Inject 6-45 Units into the skin See admin instructions. 6 units before breakfast, 25 units before lunch, 45 units before dinner   insulin degludec 200 UNIT/ML FlexTouch Pen Commonly known as: TRESIBA Inject 124 Units into the skin daily.   levothyroxine 137 MCG tablet Commonly known as: SYNTHROID Take 137 mcg by mouth See admin instructions. Mon - Sat; does not take on Sun   metFORMIN 1000 MG tablet Commonly known as: GLUCOPHAGE Take 1,000-1,500 mg by mouth See admin instructions. 1000 mg in AM. 1500 mg in PM.   montelukast 10 MG tablet Commonly known as: SINGULAIR Take 10 mg by mouth at bedtime.    multivitamin tablet Take 1 tablet by mouth daily.   pantoprazole 40 MG tablet Commonly known as: PROTONIX Take 40 mg by mouth 2 (two) times daily.   PARoxetine 20 MG tablet Commonly known as: PAXIL Take 20 mg by mouth daily.   pioglitazone 45 MG tablet Commonly known as: ACTOS Take 45 mg by mouth daily.   simvastatin 40 MG tablet Commonly known as: ZOCOR Take 40 mg by mouth at bedtime.   tamsulosin 0.4 MG Caps capsule Commonly known as: FLOMAX Take 1 capsule (0.4 mg total) by mouth daily.   vitamin B-12 1000 MCG tablet Commonly known as: CYANOCOBALAMIN Take 1,000 mcg by mouth daily.       Allergies:  Allergies  Allergen Reactions  . Pregabalin Swelling    Legs and ankles  . Oxycodone Nausea And Vomiting    Other reaction(s): Hallucinations, Vomiting  . Crestor [Rosuvastatin]     Muscle pain  . Doxycycline Nausea Only  . Lipitor [Atorvastatin]     Muscle pain  . Tramadol Anxiety    Other reaction(s): Hallucinations    Family History: Family History  Problem Relation Age of Onset  . Prostate cancer Father   . Bladder Cancer Neg Hx   . Kidney cancer Neg Hx     Social History:  reports that he has never smoked. He has never used smokeless tobacco. He reports that he does not drink alcohol and does not use drugs.   Physical Exam: BP (!) 153/78   Pulse 88   Ht $R'5\' 10"'ha$  (1.778 m)   Wt 252 lb (114.3 kg)   BMI 36.16 kg/m   Constitutional:  Alert and oriented, No acute distress. HEENT: [place] AT, moist mucus membranes.  Trachea midline, no masses. Cardiovascular: No clubbing, cyanosis, or edema. Respiratory: Normal respiratory effort, no increased work of breathing. GU: Prostate 35 g, smooth without nodules Psychiatric: Normal mood and affect.    Assessment & Plan:    1.  BPH with LUTS  Stable voiding symptoms on tamsulosin; refill sent  He desires to continue prostate cancer screening and PSA was ordered  Continue annual follow-up   Abbie Sons, MD  [facility] [address] [place], [place] [address] [phone]

## 2020-02-04 ENCOUNTER — Encounter: Payer: Self-pay | Admitting: Urology

## 2020-02-04 ENCOUNTER — Telehealth: Payer: Self-pay | Admitting: Family Medicine

## 2020-02-04 DIAGNOSIS — J301 Allergic rhinitis due to pollen: Secondary | ICD-10-CM | POA: Diagnosis not present

## 2020-02-04 LAB — PSA: Prostate Specific Ag, Serum: 1.8 ng/mL (ref 0.0–4.0)

## 2020-02-04 MED ORDER — TAMSULOSIN HCL 0.4 MG PO CAPS: 0.4000 mg | ORAL_CAPSULE | Freq: Every day | ORAL | 3 refills | Status: DC

## 2020-02-04 NOTE — Telephone Encounter (Signed)
-----   Message from Abbie Sons, MD sent at 02/04/2020  2:23 PM EST ----- PSA stable 1.8

## 2020-02-04 NOTE — Telephone Encounter (Signed)
Patient notified and voiced understanding.

## 2020-02-08 DIAGNOSIS — B349 Viral infection, unspecified: Secondary | ICD-10-CM | POA: Diagnosis not present

## 2020-02-16 DIAGNOSIS — E1169 Type 2 diabetes mellitus with other specified complication: Secondary | ICD-10-CM | POA: Diagnosis not present

## 2020-02-16 DIAGNOSIS — R0602 Shortness of breath: Secondary | ICD-10-CM | POA: Diagnosis not present

## 2020-02-16 DIAGNOSIS — I10 Essential (primary) hypertension: Secondary | ICD-10-CM | POA: Diagnosis not present

## 2020-02-16 DIAGNOSIS — U071 COVID-19: Secondary | ICD-10-CM | POA: Diagnosis not present

## 2020-02-16 DIAGNOSIS — Z794 Long term (current) use of insulin: Secondary | ICD-10-CM | POA: Diagnosis not present

## 2020-02-16 DIAGNOSIS — R059 Cough, unspecified: Secondary | ICD-10-CM | POA: Diagnosis not present

## 2020-02-19 DIAGNOSIS — E1121 Type 2 diabetes mellitus with diabetic nephropathy: Secondary | ICD-10-CM | POA: Diagnosis not present

## 2020-02-19 DIAGNOSIS — Z794 Long term (current) use of insulin: Secondary | ICD-10-CM | POA: Diagnosis not present

## 2020-02-20 ENCOUNTER — Emergency Department: Payer: HMO

## 2020-02-20 ENCOUNTER — Inpatient Hospital Stay
Admission: EM | Admit: 2020-02-20 | Discharge: 2020-02-23 | DRG: 177 | Disposition: A | Discharge status: Home / Self Care | Payer: HMO | Attending: Internal Medicine | Admitting: Internal Medicine

## 2020-02-20 ENCOUNTER — Other Ambulatory Visit: Payer: Self-pay

## 2020-02-20 ENCOUNTER — Encounter: Payer: Self-pay | Admitting: Emergency Medicine

## 2020-02-20 DIAGNOSIS — T380X5A Adverse effect of glucocorticoids and synthetic analogues, initial encounter: Secondary | ICD-10-CM | POA: Diagnosis not present

## 2020-02-20 DIAGNOSIS — Z7982 Long term (current) use of aspirin: Secondary | ICD-10-CM

## 2020-02-20 DIAGNOSIS — E039 Hypothyroidism, unspecified: Secondary | ICD-10-CM | POA: Diagnosis present

## 2020-02-20 DIAGNOSIS — Z881 Allergy status to other antibiotic agents status: Secondary | ICD-10-CM

## 2020-02-20 DIAGNOSIS — Z79899 Other long term (current) drug therapy: Secondary | ICD-10-CM

## 2020-02-20 DIAGNOSIS — E119 Type 2 diabetes mellitus without complications: Secondary | ICD-10-CM

## 2020-02-20 DIAGNOSIS — K219 Gastro-esophageal reflux disease without esophagitis: Secondary | ICD-10-CM | POA: Diagnosis present

## 2020-02-20 DIAGNOSIS — R0602 Shortness of breath: Secondary | ICD-10-CM | POA: Diagnosis not present

## 2020-02-20 DIAGNOSIS — F32A Depression, unspecified: Secondary | ICD-10-CM | POA: Diagnosis present

## 2020-02-20 DIAGNOSIS — U071 COVID-19: Secondary | ICD-10-CM | POA: Diagnosis present

## 2020-02-20 DIAGNOSIS — E785 Hyperlipidemia, unspecified: Secondary | ICD-10-CM | POA: Diagnosis present

## 2020-02-20 DIAGNOSIS — I10 Essential (primary) hypertension: Secondary | ICD-10-CM | POA: Diagnosis present

## 2020-02-20 DIAGNOSIS — J9601 Acute respiratory failure with hypoxia: Secondary | ICD-10-CM | POA: Diagnosis present

## 2020-02-20 DIAGNOSIS — R0902 Hypoxemia: Secondary | ICD-10-CM | POA: Diagnosis present

## 2020-02-20 DIAGNOSIS — Z8042 Family history of malignant neoplasm of prostate: Secondary | ICD-10-CM

## 2020-02-20 DIAGNOSIS — F419 Anxiety disorder, unspecified: Secondary | ICD-10-CM | POA: Diagnosis present

## 2020-02-20 DIAGNOSIS — Z888 Allergy status to other drugs, medicaments and biological substances status: Secondary | ICD-10-CM

## 2020-02-20 DIAGNOSIS — E1140 Type 2 diabetes mellitus with diabetic neuropathy, unspecified: Secondary | ICD-10-CM | POA: Diagnosis present

## 2020-02-20 DIAGNOSIS — Z6836 Body mass index (BMI) 36.0-36.9, adult: Secondary | ICD-10-CM

## 2020-02-20 DIAGNOSIS — N179 Acute kidney failure, unspecified: Secondary | ICD-10-CM

## 2020-02-20 DIAGNOSIS — J1282 Pneumonia due to coronavirus disease 2019: Secondary | ICD-10-CM | POA: Diagnosis present

## 2020-02-20 DIAGNOSIS — E8881 Metabolic syndrome: Secondary | ICD-10-CM | POA: Diagnosis present

## 2020-02-20 DIAGNOSIS — Z794 Long term (current) use of insulin: Secondary | ICD-10-CM

## 2020-02-20 DIAGNOSIS — Z973 Presence of spectacles and contact lenses: Secondary | ICD-10-CM

## 2020-02-20 DIAGNOSIS — E1165 Type 2 diabetes mellitus with hyperglycemia: Secondary | ICD-10-CM | POA: Diagnosis not present

## 2020-02-20 DIAGNOSIS — J189 Pneumonia, unspecified organism: Secondary | ICD-10-CM | POA: Diagnosis not present

## 2020-02-20 DIAGNOSIS — E669 Obesity, unspecified: Secondary | ICD-10-CM | POA: Diagnosis present

## 2020-02-20 DIAGNOSIS — K5900 Constipation, unspecified: Secondary | ICD-10-CM | POA: Diagnosis present

## 2020-02-20 DIAGNOSIS — E860 Dehydration: Secondary | ICD-10-CM | POA: Diagnosis present

## 2020-02-20 DIAGNOSIS — Z885 Allergy status to narcotic agent status: Secondary | ICD-10-CM

## 2020-02-20 DIAGNOSIS — Z7984 Long term (current) use of oral hypoglycemic drugs: Secondary | ICD-10-CM

## 2020-02-20 DIAGNOSIS — Z79890 Hormone replacement therapy (postmenopausal): Secondary | ICD-10-CM | POA: Diagnosis not present

## 2020-02-20 DIAGNOSIS — R110 Nausea: Secondary | ICD-10-CM | POA: Diagnosis not present

## 2020-02-20 DIAGNOSIS — N400 Enlarged prostate without lower urinary tract symptoms: Secondary | ICD-10-CM | POA: Diagnosis present

## 2020-02-20 DIAGNOSIS — R531 Weakness: Secondary | ICD-10-CM | POA: Diagnosis not present

## 2020-02-20 DIAGNOSIS — R059 Cough, unspecified: Secondary | ICD-10-CM | POA: Diagnosis not present

## 2020-02-20 LAB — URINALYSIS, COMPLETE (UACMP) WITH MICROSCOPIC
Bacteria, UA: NONE SEEN
Bilirubin Urine: NEGATIVE
Glucose, UA: NEGATIVE mg/dL
Hgb urine dipstick: NEGATIVE
Ketones, ur: 20 mg/dL — AB
Leukocytes,Ua: NEGATIVE
Nitrite: NEGATIVE
Protein, ur: 100 mg/dL — AB
Specific Gravity, Urine: 1.021 (ref 1.005–1.030)
Squamous Epithelial / HPF: NONE SEEN (ref 0–5)
pH: 5 (ref 5.0–8.0)

## 2020-02-20 LAB — COMPREHENSIVE METABOLIC PANEL WITH GFR
ALT: 36 U/L (ref 0–44)
AST: 71 U/L — ABNORMAL HIGH (ref 15–41)
Albumin: 4.2 g/dL (ref 3.5–5.0)
Alkaline Phosphatase: 73 U/L (ref 38–126)
Anion gap: 20 — ABNORMAL HIGH (ref 5–15)
BUN: 53 mg/dL — ABNORMAL HIGH (ref 8–23)
CO2: 24 mmol/L (ref 22–32)
Calcium: 9.4 mg/dL (ref 8.9–10.3)
Chloride: 95 mmol/L — ABNORMAL LOW (ref 98–111)
Creatinine, Ser: 1.64 mg/dL — ABNORMAL HIGH (ref 0.61–1.24)
GFR, Estimated: 45 mL/min — ABNORMAL LOW
Glucose, Bld: 156 mg/dL — ABNORMAL HIGH (ref 70–99)
Potassium: 3.9 mmol/L (ref 3.5–5.1)
Sodium: 139 mmol/L (ref 135–145)
Total Bilirubin: 1 mg/dL (ref 0.3–1.2)
Total Protein: 8.5 g/dL — ABNORMAL HIGH (ref 6.5–8.1)

## 2020-02-20 LAB — CBC
HCT: 36.8 % — ABNORMAL LOW (ref 39.0–52.0)
Hemoglobin: 11.9 g/dL — ABNORMAL LOW (ref 13.0–17.0)
MCH: 27.2 pg (ref 26.0–34.0)
MCHC: 32.3 g/dL (ref 30.0–36.0)
MCV: 84.2 fL (ref 80.0–100.0)
Platelets: 212 K/uL (ref 150–400)
RBC: 4.37 MIL/uL (ref 4.22–5.81)
RDW: 15.3 % (ref 11.5–15.5)
WBC: 7.9 K/uL (ref 4.0–10.5)
nRBC: 0 % (ref 0.0–0.2)

## 2020-02-20 LAB — BASIC METABOLIC PANEL WITH GFR
Anion gap: 19 — ABNORMAL HIGH (ref 5–15)
BUN: 51 mg/dL — ABNORMAL HIGH (ref 8–23)
CO2: 24 mmol/L (ref 22–32)
Calcium: 8.9 mg/dL (ref 8.9–10.3)
Chloride: 95 mmol/L — ABNORMAL LOW (ref 98–111)
Creatinine, Ser: 1.67 mg/dL — ABNORMAL HIGH (ref 0.61–1.24)
GFR, Estimated: 44 mL/min — ABNORMAL LOW
Glucose, Bld: 188 mg/dL — ABNORMAL HIGH (ref 70–99)
Potassium: 3.5 mmol/L (ref 3.5–5.1)
Sodium: 138 mmol/L (ref 135–145)

## 2020-02-20 LAB — GLUCOSE, CAPILLARY: Glucose-Capillary: 323 mg/dL — ABNORMAL HIGH (ref 70–99)

## 2020-02-20 LAB — TROPONIN I (HIGH SENSITIVITY)
Troponin I (High Sensitivity): 11 ng/L
Troponin I (High Sensitivity): 9 ng/L (ref <18)

## 2020-02-20 LAB — PROCALCITONIN: Procalcitonin: <0.1 ng/mL

## 2020-02-20 MED ORDER — ENOXAPARIN SODIUM 60 MG/0.6ML ~~LOC~~ SOLN
0.5000 mg/kg | SUBCUTANEOUS | Status: DC
  Administered 2020-02-20 – 2020-02-22 (×3): 57.5 mg via SUBCUTANEOUS
  Filled 2020-02-20 (×3): qty 0.6

## 2020-02-20 MED ORDER — METOPROLOL TARTRATE 5 MG/5ML IV SOLN
10.0000 mg | INTRAVENOUS | Status: DC | PRN
Start: 2020-02-20 — End: 2020-02-23

## 2020-02-20 MED ORDER — ONDANSETRON HCL 4 MG PO TABS: 4.0000 mg | ORAL_TABLET | Freq: Four times a day (QID) | ORAL | Status: DC | PRN

## 2020-02-20 MED ORDER — LEVOTHYROXINE SODIUM 137 MCG PO TABS: 137.0000 ug | ORAL_TABLET | ORAL | Status: DC

## 2020-02-20 MED ORDER — ADULT MULTIVITAMIN W/MINERALS CH
1.0000 | ORAL_TABLET | Freq: Every day | ORAL | Status: DC
  Administered 2020-02-21 – 2020-02-23 (×3): 1 via ORAL
  Filled 2020-02-20 (×3): qty 1

## 2020-02-20 MED ORDER — INSULIN ASPART 100 UNIT/ML ~~LOC~~ SOLN: 6.0000 [IU] | Freq: Every day | SUBCUTANEOUS | Status: DC

## 2020-02-20 MED ORDER — ASCORBIC ACID 500 MG PO TABS
500.0000 mg | ORAL_TABLET | Freq: Every day | ORAL | Status: DC
  Administered 2020-02-21 – 2020-02-23 (×3): 500 mg via ORAL
  Filled 2020-02-20 (×3): qty 1

## 2020-02-20 MED ORDER — INSULIN ASPART 100 UNIT/ML ~~LOC~~ SOLN
0.0000 [IU] | Freq: Three times a day (TID) | SUBCUTANEOUS | Status: DC
  Administered 2020-02-21: 17:00:00 15 [IU] via SUBCUTANEOUS
  Administered 2020-02-21: 10:00:00 30 [IU] via SUBCUTANEOUS
  Administered 2020-02-22: 12:00:00 7 [IU] via SUBCUTANEOUS
  Administered 2020-02-22: 18:00:00 11 [IU] via SUBCUTANEOUS
  Administered 2020-02-23: 13:00:00 4 [IU] via SUBCUTANEOUS
  Filled 2020-02-20 (×5): qty 1

## 2020-02-20 MED ORDER — INSULIN ASPART 100 UNIT/ML ~~LOC~~ SOLN
45.0000 [IU] | Freq: Every day | SUBCUTANEOUS | Status: DC
  Administered 2020-02-21: 45 [IU] via SUBCUTANEOUS
  Filled 2020-02-20: qty 1

## 2020-02-20 MED ORDER — SIMVASTATIN 20 MG PO TABS
40.0000 mg | ORAL_TABLET | Freq: Every day | ORAL | Status: DC
  Administered 2020-02-20 – 2020-02-22 (×3): 40 mg via ORAL
  Filled 2020-02-20 (×3): qty 2

## 2020-02-20 MED ORDER — LEVOTHYROXINE SODIUM 137 MCG PO TABS
137.0000 ug | ORAL_TABLET | ORAL | Status: DC
  Administered 2020-02-21 – 2020-02-23 (×3): 137 ug via ORAL
  Filled 2020-02-20 (×4): qty 1

## 2020-02-20 MED ORDER — SODIUM CHLORIDE 0.9 % IV SOLN
200.0000 mg | Freq: Once | INTRAVENOUS | Status: AC
  Administered 2020-02-20: 200 mg via INTRAVENOUS
  Filled 2020-02-20: qty 200

## 2020-02-20 MED ORDER — PAROXETINE HCL 20 MG PO TABS
20.0000 mg | ORAL_TABLET | Freq: Every day | ORAL | Status: DC
  Administered 2020-02-21 – 2020-02-23 (×3): 20 mg via ORAL
  Filled 2020-02-20 (×4): qty 1

## 2020-02-20 MED ORDER — ONDANSETRON HCL 4 MG/2ML IJ SOLN: 4.0000 mg | Freq: Four times a day (QID) | INTRAMUSCULAR | Status: DC | PRN

## 2020-02-20 MED ORDER — METHYLPREDNISOLONE SODIUM SUCC 125 MG IJ SOLR
125.0000 mg | Freq: Once | INTRAMUSCULAR | Status: AC
  Administered 2020-02-20: 125 mg via INTRAVENOUS
  Filled 2020-02-20: qty 2

## 2020-02-20 MED ORDER — SODIUM CHLORIDE 0.9 % IV SOLN
100.0000 mg | Freq: Every day | INTRAVENOUS | Status: DC
  Administered 2020-02-21 – 2020-02-23 (×3): 100 mg via INTRAVENOUS
  Filled 2020-02-20 (×3): qty 20

## 2020-02-20 MED ORDER — ACETAMINOPHEN 325 MG PO TABS
325.0000 mg | ORAL_TABLET | Freq: Four times a day (QID) | ORAL | Status: DC | PRN
  Administered 2020-02-22: 16:00:00 325 mg via ORAL
  Filled 2020-02-20: qty 1

## 2020-02-20 MED ORDER — PREDNISONE 50 MG PO TABS: 50.0000 mg | ORAL_TABLET | Freq: Every day | ORAL | Status: DC

## 2020-02-20 MED ORDER — METHYLPREDNISOLONE SODIUM SUCC 125 MG IJ SOLR
1.0000 mg/kg | Freq: Two times a day (BID) | INTRAMUSCULAR | Status: DC
  Administered 2020-02-21: 114.375 mg via INTRAVENOUS
  Filled 2020-02-20: qty 2

## 2020-02-20 MED ORDER — SODIUM CHLORIDE 0.9 % IV BOLUS
1000.0000 mL | Freq: Once | INTRAVENOUS | Status: AC
  Administered 2020-02-20: 1000 mL via INTRAVENOUS

## 2020-02-20 MED ORDER — PANTOPRAZOLE SODIUM 40 MG PO TBEC
80.0000 mg | DELAYED_RELEASE_TABLET | Freq: Every day | ORAL | Status: DC
  Administered 2020-02-21 – 2020-02-23 (×3): 80 mg via ORAL
  Filled 2020-02-20 (×3): qty 2

## 2020-02-20 MED ORDER — GUAIFENESIN-DM 100-10 MG/5ML PO SYRP: 10.0000 mL | ORAL_SOLUTION | ORAL | Status: DC | PRN

## 2020-02-20 MED ORDER — POLYETHYLENE GLYCOL 3350 17 G PO PACK
17.0000 g | PACK | Freq: Two times a day (BID) | ORAL | Status: DC
  Administered 2020-02-20 – 2020-02-23 (×2): 17 g via ORAL
  Filled 2020-02-20 (×5): qty 1

## 2020-02-20 MED ORDER — PANTOPRAZOLE SODIUM 40 MG PO TBEC: 40.0000 mg | DELAYED_RELEASE_TABLET | Freq: Two times a day (BID) | ORAL | Status: DC

## 2020-02-20 MED ORDER — IPRATROPIUM-ALBUTEROL 0.5-2.5 (3) MG/3ML IN SOLN
3.0000 mL | Freq: Once | RESPIRATORY_TRACT | Status: AC
  Administered 2020-02-20: 3 mL via RESPIRATORY_TRACT
  Filled 2020-02-20: qty 3

## 2020-02-20 MED ORDER — PANTOPRAZOLE SODIUM 40 MG PO TBEC: 80.0000 mg | DELAYED_RELEASE_TABLET | Freq: Every day | ORAL | Status: DC

## 2020-02-20 MED ORDER — ASPIRIN EC 81 MG PO TBEC
81.0000 mg | DELAYED_RELEASE_TABLET | Freq: Every day | ORAL | Status: DC
  Administered 2020-02-21 – 2020-02-23 (×3): 81 mg via ORAL
  Filled 2020-02-20 (×3): qty 1

## 2020-02-20 MED ORDER — LACTATED RINGERS IV SOLN: INTRAVENOUS | Status: AC

## 2020-02-20 MED ORDER — INSULIN ASPART 100 UNIT/ML ~~LOC~~ SOLN
0.0000 [IU] | Freq: Every day | SUBCUTANEOUS | Status: DC
  Administered 2020-02-20 – 2020-02-22 (×2): 4 [IU] via SUBCUTANEOUS
  Filled 2020-02-20 (×2): qty 1

## 2020-02-20 MED ORDER — INSULIN DEGLUDEC 200 UNIT/ML ~~LOC~~ SOPN: 124.0000 [IU] | PEN_INJECTOR | Freq: Every day | SUBCUTANEOUS | Status: DC

## 2020-02-20 MED ORDER — METOPROLOL TARTRATE 5 MG/5ML IV SOLN: 10.0000 mg | INTRAVENOUS | Status: DC | PRN

## 2020-02-20 MED ORDER — ZINC SULFATE 220 (50 ZN) MG PO CAPS
220.0000 mg | ORAL_CAPSULE | Freq: Every day | ORAL | Status: DC
  Administered 2020-02-21 – 2020-02-23 (×3): 220 mg via ORAL
  Filled 2020-02-20 (×3): qty 1

## 2020-02-20 MED ORDER — INSULIN ASPART 100 UNIT/ML ~~LOC~~ SOLN
25.0000 [IU] | Freq: Every day | SUBCUTANEOUS | Status: DC
  Administered 2020-02-21: 25 [IU] via SUBCUTANEOUS
  Filled 2020-02-20: qty 1

## 2020-02-20 MED ORDER — HYDRALAZINE HCL 50 MG PO TABS
25.0000 mg | ORAL_TABLET | Freq: Three times a day (TID) | ORAL | Status: DC
  Administered 2020-02-20 – 2020-02-23 (×9): 25 mg via ORAL
  Filled 2020-02-20 (×9): qty 1

## 2020-02-20 MED ORDER — TAMSULOSIN HCL 0.4 MG PO CAPS
0.4000 mg | ORAL_CAPSULE | Freq: Every day | ORAL | Status: DC
  Administered 2020-02-21 – 2020-02-23 (×3): 0.4 mg via ORAL
  Filled 2020-02-20 (×3): qty 1

## 2020-02-20 MED ORDER — INSULIN ASPART 100 UNIT/ML ~~LOC~~ SOLN: 6.0000 [IU] | SUBCUTANEOUS | Status: DC

## 2020-02-20 MED ORDER — INSULIN GLARGINE 100 UNIT/ML ~~LOC~~ SOLN
124.0000 [IU] | Freq: Every day | SUBCUTANEOUS | Status: DC
  Administered 2020-02-21 – 2020-02-23 (×3): 124 [IU] via SUBCUTANEOUS
  Filled 2020-02-20 (×4): qty 1.24

## 2020-02-20 NOTE — Progress Notes (Signed)
PHARMACIST - PHYSICIAN COMMUNICATION  CONCERNING:  Enoxaparin (Lovenox) for DVT Prophylaxis    RECOMMENDATION: Patient was prescribed enoxaprin 40mg  q24 hours for VTE prophylaxis.   Filed Weights   02/20/20 1142  Weight: 114.3 kg (252 lb)    Body mass index is 36.16 kg/m.  Estimated Creatinine Clearance: 53.6 mL/min (A) (by C-G formula based on SCr of 1.67 mg/dL (H)).   Based on [facility] patient is candidate for enoxaparin 0.5mg /kg TBW SQ every 24 hours based on BMI being >30.   DESCRIPTION: Pharmacy has adjusted enoxaparin dose per [facility] policy.  Patient is now receiving enoxaparin 57.5 mg every 24 hours    Dorothe Pea, PharmD Clinical Pharmacist  02/20/2020 7:46 PM

## 2020-02-20 NOTE — ED Provider Notes (Signed)
[facility] Emergency Department Provider Note ____________________________________________   Event Date/Time   First MD Initiated Contact with Patient 02/20/20 1343     (approximate)  I have reviewed the triage vital signs and the nursing notes.   HISTORY  Chief Complaint Weakness  HPI Nicholas Humphrey is a 70 y.o. male with history of DM, HTN, and COVID presents to the emergency department for treatment and evaluation.  Patient was diagnosed with COVID-19. He tested positive on 02/08/20. 1 week into illness, he had began to feel better and felt as if he was almost back to normal.  1 week later, he states that he began to feel much worse.  He has developed persistent cough with fever, generalized body aches, shortness of breath, and nausea.  He experiences pain in his chest when he tries to "talk a lot."  His primary care provider has treated him with prednisone and azithromycin without any improvement.  He finished his last tablets this morning.         Past Medical History:  Diagnosis Date  . Diabetes mellitus without complication ([place])   . Diabetic neuropathy ([place])   . Fall from ladder 2018   LANDED ON CONCRETE  . GERD (gastroesophageal reflux disease)   . Hypercholesteremia   . Hypertension   . Hypothyroidism   . PONV (postoperative nausea and vomiting)   . Wears contact lenses     Patient Active Problem List   Diagnosis Date Noted  . Acute hypoxemic respiratory failure ([place]) 02/20/2020    Past Surgical History:  Procedure Laterality Date  . APPENDECTOMY    . BACK SURGERY  2019  . CARPAL TUNNEL RELEASE Right 08/18/2019   Procedure: ENDOSCOPIC RIGHT CARPAL TUNNEL RELEASE , RELEASE RIGHT LONG TRIGGER FINGER. INDEX TRIGGER AND TRIGGER THUMB RELEASE;  Surgeon: Corky Mull, MD;  Location: [facility] OR[facility];  Service: Orthopedics;  Laterality: Right;  . ELBOW SURGERY Left 2018   X2  . FRACTURE SURGERY Left    SEVERAL BROKEN BONES AFTER FALL  . HAND  SURGERY Left 2018   HARDWARE REMOVED  . HAND SURGERY Left 2018   HARDWARE AFTER FALL  . INGUINAL HERNIA REPAIR Right   . KNEE ARTHROSCOPY W/ PARTIAL MEDIAL MENISCECTOMY Left 08/17/2010  . LUMBAR LAMINECTOMY    . ROTATOR CUFF REPAIR Right 2013  . TRIGGER FINGER RELEASE Right 10/04/2015   Procedure: RELEASE TRIGGER FINGER/A-1 PULLEY;  Surgeon: Corky Mull, MD;  Location: [facility];  Service: Orthopedics;  Laterality: Right;  PREFER BIER BLOCK Diabetic - insulin and oral meds  . TYMPANOPLASTY Right     Prior to Admission medications   Medication Sig Start Date End Date Taking? Authorizing Provider  aspirin 81 MG tablet Take 81 mg by mouth daily.    [provider]  Continuous Blood Gluc Receiver (FREESTYLE LIBRE READER) DEVI Use 1 kit as directed 04/21/17   [provider]  Continuous Blood Gluc Sensor (FREESTYLE LIBRE SENSOR SYSTEM) MISC Use 1 each every 14 (fourteen) days 04/21/17   [provider]  enalapril (VASOTEC) 5 MG tablet Take 5 mg by mouth daily.     [provider]  esomeprazole (NEXIUM) 40 MG capsule Take 40 mg by mouth every morning.    [provider]  furosemide (LASIX) 20 MG tablet Take by mouth. 11/30/19 11/29/20  [provider]  gabapentin (NEURONTIN) 300 MG capsule Take by mouth. 11/30/19 11/29/20  [provider]  glucose blood test strip Use 1  strip via meter 3-4 times daily as directed in a rotating pattern before a meal, 2 hours after a meal or bedtime. 04/15/16   [provider]  hydrochlorothiazide (HYDRODIURIL) 50 MG tablet Take 50 mg by mouth daily. 01/31/20   [provider]  insulin aspart (NOVOLOG) 100 UNIT/ML injection Inject 6-45 Units into the skin See admin instructions. 6 units before breakfast, 25 units before lunch, 45 units before dinner    [provider]  insulin degludec (TRESIBA) 200 UNIT/ML FlexTouch Pen Inject 124 Units into the skin daily.     [provider]  levothyroxine (SYNTHROID) 137 MCG tablet Take 137 mcg by mouth See admin instructions. Mon - Sat; does not take on Sun    [provider]  metFORMIN (GLUCOPHAGE) 1000 MG tablet Take 1,000-1,500 mg by mouth See admin instructions. 1000 mg in AM. 1500 mg in PM.    [provider]  montelukast (SINGULAIR) 10 MG tablet Take 10 mg by mouth at bedtime.    [provider]  Multiple Vitamin (MULTIVITAMIN) tablet Take 1 tablet by mouth daily.    [provider]  pantoprazole (PROTONIX) 40 MG tablet Take 40 mg by mouth 2 (two) times daily. 01/31/20   [provider]  PARoxetine (PAXIL) 20 MG tablet Take 20 mg by mouth daily.  10/24/17   [provider]  pioglitazone (ACTOS) 45 MG tablet Take 45 mg by mouth daily.    [provider]  simvastatin (ZOCOR) 40 MG tablet Take 40 mg by mouth at bedtime.     [provider]  tamsulosin (FLOMAX) 0.4 MG CAPS capsule Take 1 capsule (0.4 mg total) by mouth daily. 02/04/20   Stoioff, Ronda Fairly, MD  vitamin B-12 (CYANOCOBALAMIN) 1000 MCG tablet Take 1,000 mcg by mouth daily.    [provider]    Allergies Pregabalin, Oxycodone, Crestor [rosuvastatin], Doxycycline, Lipitor [atorvastatin], and Tramadol  Family History  Problem Relation Age of Onset  . Prostate cancer Father   . Bladder Cancer Neg Hx   . Kidney cancer Neg Hx     Social History Social History   Tobacco Use  . Smoking status: Never Smoker  . Smokeless tobacco: Never Used  Vaping Use  . Vaping Use: Never used  Substance Use Topics  . Alcohol use: No  . Drug use: No    Review of Systems  Constitutional: Positive for fever/chills Eyes: No visual changes. ENT: No sore throat. Cardiovascular: Positive for chest pain. Respiratory: Positive for shortness of breath. Gastrointestinal: No abdominal pain.  Positive for nausea, no vomiting.  No diarrhea.  No constipation. Genitourinary: Negative for  dysuria. Musculoskeletal: Negative for back pain. Skin: Negative for rash. Neurological: Positive for headaches, negative for focal weakness or numbness. ____________________________________________   PHYSICAL EXAM:  VITAL SIGNS: ED Triage Vitals  Enc Vitals Group     BP 02/20/20 1144 121/67     Pulse Rate 02/20/20 1144 85     Resp 02/20/20 1144 (!) 24     Temp 02/20/20 1144 (!) 97.5 F (36.4 C)     Temp Source 02/20/20 1144 Oral     SpO2 02/20/20 1144 100 %     Weight 02/20/20 1142 252 lb (114.3 kg)     Height 02/20/20 1142 $RemoveBefor'5\' 10"'niSbSgnICyrS$  (1.778 m)     Head Circumference --      Peak Flow --      Pain Score 02/20/20 1142 0     Pain Loc --  Pain Edu? --      Excl. in [place]? --     Constitutional: Alert and oriented.  Acutely ill appearing and in no acute distress. Eyes: Conjunctivae are normal. Head: Atraumatic. Nose: Pan sinus congestion Mouth/Throat: Mucous membranes are moist.  Oropharynx non-erythematous. Neck: No stridor.   Hematological/Lymphatic/Immunilogical: No cervical lymphadenopathy. Cardiovascular: Normal rate, regular rhythm. Grossly normal heart sounds.  Good peripheral circulation. Respiratory: Normal respiratory effort.  No retractions. Lungs CTAB.  Speaking in 2-3 word sentences. Gastrointestinal: Soft and nontender. No distention. No abdominal bruits. Genitourinary:  Musculoskeletal: No lower extremity tenderness nor edema.  No joint effusions. Neurologic:  Normal speech and language. No gross focal neurologic deficits are appreciated. No gait instability. Skin:  Skin is warm, dry and intact. No rash noted. Psychiatric: Mood and affect are normal. Speech and behavior are normal.  ____________________________________________   LABS (all labs ordered are listed, but only abnormal results are displayed)  Labs Reviewed  CBC - Abnormal; Notable for the following components:      Result Value   Hemoglobin 11.9 (*)    HCT 36.8 (*)    All other components  within normal limits  URINALYSIS, COMPLETE (UACMP) WITH MICROSCOPIC - Abnormal; Notable for the following components:   Color, Urine YELLOW (*)    APPearance HAZY (*)    Ketones, ur 20 (*)    Protein, ur 100 (*)    All other components within normal limits  COMPREHENSIVE METABOLIC PANEL - Abnormal; Notable for the following components:   Chloride 95 (*)    Glucose, Bld 156 (*)    BUN 53 (*)    Creatinine, Ser 1.64 (*)    Total Protein 8.5 (*)    AST 71 (*)    GFR, Estimated 45 (*)    Anion gap 20 (*)    All other components within normal limits  BASIC METABOLIC PANEL - Abnormal; Notable for the following components:   Chloride 95 (*)    Glucose, Bld 188 (*)    BUN 51 (*)    Creatinine, Ser 1.67 (*)    GFR, Estimated 44 (*)    Anion gap 19 (*)    All other components within normal limits  PROCALCITONIN  HIV ANTIBODY (ROUTINE TESTING W REFLEX)  CBC WITH DIFFERENTIAL/PLATELET  COMPREHENSIVE METABOLIC PANEL  C-REACTIVE PROTEIN  FIBRIN DERIVATIVES D-DIMER ([facility] ONLY)  HEMOGLOBIN A1C  CBG MONITORING, ED  TROPONIN I (HIGH SENSITIVITY)  TROPONIN I (HIGH SENSITIVITY)   ____________________________________________  EKG  ED ECG REPORT I, Israella Hubert, FNP-BC personally viewed and interpreted this ECG.   Date: 02/20/2020  EKG Time: 1147  Rate: 87  Rhythm: normal EKG, normal sinus rhythm  Axis: Normal  Intervals:none  ST&T Change: No ST elevation  ____________________________________________  RADIOLOGY  ED MD interpretation:    Patchy infiltrate in left lower lobe.  I, Sherrie George, personally viewed and evaluated these images (plain radiographs) as part of my medical decision making, as well as reviewing the written report by the radiologist.  Official radiology report(s): DG Chest 2 View  Result Date: 02/20/2020 CLINICAL DATA:  Shortness of breath, cough, increasing weakness for several weeks, tested positive for COVID-19 3 weeks ago, nausea EXAM: CHEST - 2 VIEW  COMPARISON:  07/19/2019 FINDINGS: Normal heart size, mediastinal contours, and pulmonary vascularity. Minimal patchy infiltrate in periphery of lower LEFT lung. Remaining lungs clear. No pleural effusion or pneumothorax. Bones demineralized. IMPRESSION: Minimal patchy infiltrate at lateral lower LEFT lung question pneumonia. Electronically Signed  By: Lavonia Dana M.D.   On: 02/20/2020 12:46    ____________________________________________   PROCEDURES  Procedure(s) performed (including Critical Care):  Procedures  ____________________________________________   INITIAL IMPRESSION / ASSESSMENT AND PLAN     70 year old male presenting to the emergency department 3 weeks into COVID-19 diagnosis.  See HPI for further details.  Plan will be to await results of protocol labs drawn while awaiting ER room assignment.  Patient is short of breath when speaking.  He is speaking in 2-3 word sentences.  Currently 94% on room air at rest.  DIFFERENTIAL DIAGNOSIS  COVID-19, COVID-19 pneumonia, PE  ED COURSE  Initial troponin is normal at 11.  CBC does not show leukocytosis.  CMP is concerning for acute kidney injury with a BUN of 53 and a creatinine of 1.4 with a GFR of 45.  He has no known history of kidney disease.  He also has a slight bump in the anion gap with a glucose of 156, chloride 95 and a normal sodium.  Plan will be to give normal saline.  Due to the chest pain and shortness of breath in the presence of Covid, the ideal situation would be to get a CTA to rule out PE.  Plan will be to give a liter of fluids and then repeat BMP.   Clinical Course as of 02/20/20 1944  Sun Feb 20, 2020  1750 Remains short of breath after DuoNeb and Solumedrol.  [CT]    Clinical Course User Index [CT] Lakai Moree B, FNP   ___________________________________________   FINAL CLINICAL IMPRESSION(S) / ED DIAGNOSES  Final diagnoses:  Pneumonia due to COVID-19 virus  Acute kidney injury [facility])      ED Discharge Orders    None       KELECHI ORGERON was evaluated in Emergency Department on 02/20/2020 for the symptoms described in the history of present illness. He was evaluated in the context of the global COVID-19 pandemic, which necessitated consideration that the patient might be at risk for infection with the SARS-CoV-2 virus that causes COVID-19. Institutional protocols and algorithms that pertain to the evaluation of patients at risk for COVID-19 are in a state of rapid change based on information released by regulatory bodies including the CDC and federal and state organizations. These policies and algorithms were followed during the patient's care in the ED.   Note:  This document was prepared using Dragon voice recognition software and may include unintentional dictation errors.   Victorino Dike, FNP 02/20/20 1944    Harvest Dark, MD 02/22/20 2216

## 2020-02-20 NOTE — ED Triage Notes (Signed)
Pt in via EMS from home. Pt is 3 weeks out from COVID an still with no appetite and COVID cough and just not better.

## 2020-02-20 NOTE — Consult Note (Signed)
Remdesivir - Pharmacy Brief Note   O:  ALT: 36 CXR: Minimal patchy infiltrate at lateral lower LEFT lung question pneumonia SpO2: 100% on RA   A/P:  Remdesivir 200 mg IVPB once followed by 100 mg IVPB daily x 4 days.   Sherilyn Banker, PharmD Pharmacy Resident  02/20/2020 4:24 PM

## 2020-02-20 NOTE — H&P (Signed)
History and Physical   Nicholas Humphrey YHC:[id number] DOB: [birth year] DOA: 02/20/2020  PCP: Sallee Lange, NP  Outpatient Specialists: Urology with Dr. Bernardo Heater Patient coming from: home  I have personally briefly reviewed patient's old medical records in [place].  Chief Concern: weakness and shortness of breath  HPI: Nicholas Humphrey is a 70 y.o. male with medical history significant for hypertension, IDDM2, obesity, presents tot he ED for chief concern of shortness of breath.  Shortness of breath started about two weeks ago, muscle weakness, worse with rest and improved with walking and talking.   He denies nausea and vomiting. He endorses lack of taste and smell. He denies chest and abdominal pain. He endorses constipation for one week. He endorses able to pass gas. He has minimal to no PO intake for about two weeks. He endores fever tmax of 101 which improved with acetaminophen.   Social history: lives with spouse. He denies tobacco, etoh, and recreational drug use. 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   Vaccination: not vaccinated for covid 19  ROS: Constitutional: no weight change, no fever ENT/Mouth: no sore throat, no rhinorrhea Eyes: no eye pain, no vision changes Cardiovascular: no chest pain, + dyspnea,  no edema, no palpitations Respiratory: + cough, no sputum, no wheezing Gastrointestinal: no nausea, no vomiting, no diarrhea, + constipation Genitourinary: no urinary incontinence, no dysuria, no hematuria Musculoskeletal: no arthralgias, + myalgias Skin: no skin lesions, no pruritus, Neuro: + weakness, no loss of consciousness, no syncope Psych: no anxiety, no depression, + decrease appetite Heme/Lymph: no bruising, no bleeding  ED Course: Gust with ED provider, patient requiring hospitalization due to COVID-19 infection and acute kidney injury.  Assessment/Plan  Active Problems:   Acute hypoxemic  respiratory failure (HCC)   AKI (acute kidney injury) ([place])   Metabolic syndrome   Essential hypertension   Insulin dependent type 2 diabetes mellitus (HCC)   COVID-19 PNA - IV remdesivir per pharmacy, IV solumedrol 1 g/kg IV q12h initiated  - Procalcitonin is negative, no indications for superimposed bacterial coverage - Incentive spirometry and flutter valve for 10 reps every 2 hours while awake - Albuterol inhaler 2 puffs every 4 hours while awake, budesonide 2 puffs inhaler twice daily - Daily labs: CMP, CBC, CRP, D-dimer - Supplemental oxygen to maintain SPO2 goal of greater than 88% - Airborne and contact precautions  Acute kidney injury-status post 2 L of normal saline per ED provider -Lactated Ringer's 125 cc/h for 8 hours -Conservative fluid management in setting of COVID-19 infection  Insulin-dependent diabetes mellitus resumed home insulin regimen -Insulin sliding scale with at bedtime coverage -Did not resumed Actos  Hypertension-elevated holding home hydrochlorothiazide and enalapril due to acute kidney injury -Added hydralazine 25 mg every 8 hours for SBP -Metoprolol 10 mg every 2 hours as needed for SBP greater than 160, hold for heart rate less than 65  Hyperlipidemia-simvastatin 40 mg nightly  Depression/anxiety-paroxetine 20 mg daily  Query metabolic syndrome in setting of obesity, diabetes, hypertension  Chart reviewed. He tested positive for covid19 on 02/08/2020   DVT prophylaxis: Enoxaparin Code Status: full code  Diet: Heart healthy carb modified Family Communication: No, per patient spouse is also hospitalized for COVID-19 pneumonia Disposition Plan: Pending clinical course Consults called: Not indicated at this time Admission status: MedSurg with telemetry observation  Past Medical History:  Diagnosis Date  . Diabetes mellitus without complication ([place])   . Diabetic neuropathy ([place])   .  Fall from ladder 2018   LANDED ON CONCRETE  . GERD  (gastroesophageal reflux disease)   . Hypercholesteremia   . Hypertension   . Hypothyroidism   . PONV (postoperative nausea and vomiting)   . Wears contact lenses    Past Surgical History:  Procedure Laterality Date  . APPENDECTOMY    . BACK SURGERY  2019  . CARPAL TUNNEL RELEASE Right 08/18/2019   Procedure: ENDOSCOPIC RIGHT CARPAL TUNNEL RELEASE , RELEASE RIGHT LONG TRIGGER FINGER. INDEX TRIGGER AND TRIGGER THUMB RELEASE;  Surgeon: Corky Mull, MD;  Location: [facility] ORS;  Service: Orthopedics;  Laterality: Right;  . ELBOW SURGERY Left 2018   X2  . FRACTURE SURGERY Left    SEVERAL BROKEN BONES AFTER FALL  . HAND SURGERY Left 2018   HARDWARE REMOVED  . HAND SURGERY Left 2018   HARDWARE AFTER FALL  . INGUINAL HERNIA REPAIR Right   . KNEE ARTHROSCOPY W/ PARTIAL MEDIAL MENISCECTOMY Left 08/17/2010  . LUMBAR LAMINECTOMY    . ROTATOR CUFF REPAIR Right 2013  . TRIGGER FINGER RELEASE Right 10/04/2015   Procedure: RELEASE TRIGGER FINGER/A-1 PULLEY;  Surgeon: Corky Mull, MD;  Location: [facility];  Service: Orthopedics;  Laterality: Right;  PREFER BIER BLOCK Diabetic - insulin and oral meds  . TYMPANOPLASTY Right    Social History:  reports that he has never smoked. He has never used smokeless tobacco. He reports that he does not drink alcohol and does not use drugs.  Allergies  Allergen Reactions  . Pregabalin Swelling    Legs and ankles  . Oxycodone Nausea And Vomiting    Other reaction(s): Hallucinations, Vomiting  . Crestor [Rosuvastatin]     Muscle pain  . Doxycycline Nausea Only  . Lipitor [Atorvastatin]     Muscle pain  . Tramadol Anxiety    Other reaction(s): Hallucinations   Family History  Problem Relation Age of Onset  . Prostate cancer Father   . Bladder Cancer Neg Hx   . Kidney cancer Neg Hx    Family history: Family history reviewed and not pertinent  Prior to Admission medications   Medication Sig Start Date End Date Taking? Authorizing Provider   aspirin 81 MG tablet Take 81 mg by mouth daily.    [provider]  Continuous Blood Gluc Receiver (FREESTYLE LIBRE READER) DEVI Use 1 kit as directed 04/21/17   [provider]  Continuous Blood Gluc Sensor (FREESTYLE LIBRE SENSOR SYSTEM) MISC Use 1 each every 14 (fourteen) days 04/21/17   [provider]  enalapril (VASOTEC) 5 MG tablet Take 5 mg by mouth daily.     [provider]  esomeprazole (NEXIUM) 40 MG capsule Take 40 mg by mouth every morning.    [provider]  furosemide (LASIX) 20 MG tablet Take by mouth. 11/30/19 11/29/20  [provider]  gabapentin (NEURONTIN) 300 MG capsule Take by mouth. 11/30/19 11/29/20  [provider]  glucose blood test strip Use 1 strip via meter 3-4 times daily as directed in a rotating pattern before a meal, 2 hours after a meal or bedtime. 04/15/16   [provider]  hydrochlorothiazide (HYDRODIURIL) 50 MG tablet Take 50 mg by mouth daily. 01/31/20   [provider]  insulin aspart (NOVOLOG) 100 UNIT/ML injection Inject 6-45 Units into the skin See admin instructions. 6 units before breakfast, 25 units before lunch, 45 units before dinner    [provider]  insulin degludec (TRESIBA) 200 UNIT/ML FlexTouch Pen Inject 124  Units into the skin daily.     [provider]  levothyroxine (SYNTHROID) 137 MCG tablet Take 137 mcg by mouth See admin instructions. Mon - Sat; does not take on Sun    [provider]  metFORMIN (GLUCOPHAGE) 1000 MG tablet Take 1,000-1,500 mg by mouth See admin instructions. 1000 mg in AM. 1500 mg in PM.    [provider]  montelukast (SINGULAIR) 10 MG tablet Take 10 mg by mouth at bedtime.    [provider]  Multiple Vitamin (MULTIVITAMIN) tablet Take 1 tablet by mouth daily.    [provider]  pantoprazole (PROTONIX) 40 MG tablet Take 40 mg by mouth 2 (two) times daily. 01/31/20   [provider]   PARoxetine (PAXIL) 20 MG tablet Take 20 mg by mouth daily.  10/24/17   [provider]  pioglitazone (ACTOS) 45 MG tablet Take 45 mg by mouth daily.    [provider]  simvastatin (ZOCOR) 40 MG tablet Take 40 mg by mouth at bedtime.     [provider]  tamsulosin (FLOMAX) 0.4 MG CAPS capsule Take 1 capsule (0.4 mg total) by mouth daily. 02/04/20   Stoioff, Ronda Fairly, MD  vitamin B-12 (CYANOCOBALAMIN) 1000 MCG tablet Take 1,000 mcg by mouth daily.    [provider]   Physical Exam: Vitals:   02/20/20 1142 02/20/20 1144 02/20/20 1831  BP:  121/67 (!) 145/54  Pulse:  85 90  Resp:  (!) 24 (!) 24  Temp:  (!) 97.5 F (36.4 C) 98.4 F (36.9 C)  TempSrc:  Oral Oral  SpO2:  100% 100%  Weight: 114.3 kg    Height: _0  (1.778 m)     Constitutional: appears age-appropriate, NAD, calm, comfortable Eyes: PERRL, lids and conjunctivae normal ENMT: Mucous membranes are moist. Posterior pharynx clear of any exudate or lesions. Age-appropriate dentition. Hearing appropriate Neck: normal, supple, no masses, no thyromegaly Respiratory: clear to auscultation bilaterally, no wheezing, no crackles. Normal respiratory effort.  Mild accessory muscle use.  Cardiovascular: Regular rate and rhythm, no murmurs / rubs / gallops. No extremity edema. 2+ pedal pulses. No carotid bruits.  Abdomen: Obese abdomen, no tenderness, no masses palpated, no hepatosplenomegaly. Bowel sounds positive.  Musculoskeletal: no clubbing / cyanosis. No joint deformity upper and lower extremities. Good ROM, no contractures, no atrophy. Normal muscle tone.  Skin: no rashes, lesions, ulcers. No induration Neurologic: Sensation intact. Strength 5/5 in all 4.  Psychiatric: Normal judgment and insight. Alert and oriented x 3. Normal mood.   EKG: independently reviewed, showing sinus rhythm with rate of 87, QTc 450, LVH  Chest x-ray on Admission: I personally reviewed and I agree with radiologist  reading as below.  DG Chest 2 View  Result Date: 02/20/2020 CLINICAL DATA:  Shortness of breath, cough, increasing weakness for several weeks, tested positive for COVID-19 3 weeks ago, nausea EXAM: CHEST - 2 VIEW COMPARISON:  07/19/2019 FINDINGS: Normal heart size, mediastinal contours, and pulmonary vascularity. Minimal patchy infiltrate in periphery of lower LEFT lung. Remaining lungs clear. No pleural effusion or pneumothorax. Bones demineralized. IMPRESSION: Minimal patchy infiltrate at lateral lower LEFT lung question pneumonia. Electronically Signed   By: Lavonia Dana M.D.   On: 02/20/2020 12:46   Labs on Admission: I have personally reviewed following labs  CBC: Recent Labs  Lab 02/20/20 1156  WBC 7.9  HGB 11.9*  HCT 36.8*  MCV 84.2  PLT 572   Basic Metabolic Panel: Recent Labs  Lab 02/20/20 1156  02/20/20 1520  NA 139 138  K 3.9 3.5  CL 95* 95*  CO2 24 24  GLUCOSE 156* 188*  BUN 53* 51*  CREATININE 1.64* 1.67*  CALCIUM 9.4 8.9   GFR: Estimated Creatinine Clearance: 53.6 mL/min (A) (by C-G formula based on SCr of 1.67 mg/dL (H)).  Liver Function Tests: Recent Labs  Lab 02/20/20 1156  AST 71*  ALT 36  ALKPHOS 73  BILITOT 1.0  PROT 8.5*  ALBUMIN 4.2   Urine analysis:    Component Value Date/Time   COLORURINE YELLOW (A) 02/20/2020 1444   APPEARANCEUR HAZY (A) 02/20/2020 1444   APPEARANCEUR Clear 10/08/2016 1343   LABSPEC 1.021 02/20/2020 1444   LABSPEC 1.015 04/09/2011 1042   PHURINE 5.0 02/20/2020 1444   GLUCOSEU NEGATIVE 02/20/2020 1444   GLUCOSEU Negative 04/09/2011 1042   HGBUR NEGATIVE 02/20/2020 1444   BILIRUBINUR NEGATIVE 02/20/2020 1444   BILIRUBINUR Negative 10/08/2016 1343   BILIRUBINUR Negative 04/09/2011 1042   KETONESUR 20 (A) 02/20/2020 1444   PROTEINUR 100 (A) 02/20/2020 1444   NITRITE NEGATIVE 02/20/2020 1444   LEUKOCYTESUR NEGATIVE 02/20/2020 1444   LEUKOCYTESUR Negative 04/09/2011 1042   Tasha Diaz N Melba Araki D.O. [facility]  If  7PM-7AM, please contact overnight-coverage provider If 7AM-7PM, please contact day coverage provider [email or web address]  02/20/2020, 8:02 PM

## 2020-02-20 NOTE — ED Triage Notes (Signed)
Pt to ED via POV with c/o increasing weakness x several weeks. Pt states tested positive for Covid 3 weeks ago, reports decreased appetite and PO intake since then. Pt also c/o SOB and nausea. Pt states when he feels like he is going to throw up his heart "aches a little bit".

## 2020-02-20 NOTE — ED Notes (Signed)
Patient up and ambulatory around the room.  Patient became more tachypnic (28) and short of breath somewhat but oxygen saturation never dropped below 99%.  Patient's heart rate went up to 99 while walking.

## 2020-02-21 ENCOUNTER — Other Ambulatory Visit: Payer: Self-pay

## 2020-02-21 DIAGNOSIS — Z7984 Long term (current) use of oral hypoglycemic drugs: Secondary | ICD-10-CM | POA: Diagnosis not present

## 2020-02-21 DIAGNOSIS — E785 Hyperlipidemia, unspecified: Secondary | ICD-10-CM | POA: Diagnosis present

## 2020-02-21 DIAGNOSIS — E860 Dehydration: Secondary | ICD-10-CM | POA: Diagnosis present

## 2020-02-21 DIAGNOSIS — J9601 Acute respiratory failure with hypoxia: Secondary | ICD-10-CM | POA: Diagnosis present

## 2020-02-21 DIAGNOSIS — N400 Enlarged prostate without lower urinary tract symptoms: Secondary | ICD-10-CM | POA: Diagnosis present

## 2020-02-21 DIAGNOSIS — T380X5A Adverse effect of glucocorticoids and synthetic analogues, initial encounter: Secondary | ICD-10-CM | POA: Diagnosis not present

## 2020-02-21 DIAGNOSIS — K219 Gastro-esophageal reflux disease without esophagitis: Secondary | ICD-10-CM | POA: Diagnosis present

## 2020-02-21 DIAGNOSIS — F32A Depression, unspecified: Secondary | ICD-10-CM | POA: Diagnosis present

## 2020-02-21 DIAGNOSIS — I10 Essential (primary) hypertension: Secondary | ICD-10-CM | POA: Diagnosis present

## 2020-02-21 DIAGNOSIS — E039 Hypothyroidism, unspecified: Secondary | ICD-10-CM | POA: Diagnosis present

## 2020-02-21 DIAGNOSIS — K5900 Constipation, unspecified: Secondary | ICD-10-CM | POA: Diagnosis present

## 2020-02-21 DIAGNOSIS — Z794 Long term (current) use of insulin: Secondary | ICD-10-CM | POA: Diagnosis not present

## 2020-02-21 DIAGNOSIS — E119 Type 2 diabetes mellitus without complications: Secondary | ICD-10-CM | POA: Diagnosis not present

## 2020-02-21 DIAGNOSIS — J1282 Pneumonia due to coronavirus disease 2019: Secondary | ICD-10-CM | POA: Diagnosis present

## 2020-02-21 DIAGNOSIS — E8881 Metabolic syndrome: Secondary | ICD-10-CM | POA: Diagnosis present

## 2020-02-21 DIAGNOSIS — N179 Acute kidney failure, unspecified: Secondary | ICD-10-CM | POA: Diagnosis present

## 2020-02-21 DIAGNOSIS — E1165 Type 2 diabetes mellitus with hyperglycemia: Secondary | ICD-10-CM | POA: Diagnosis not present

## 2020-02-21 DIAGNOSIS — Z7982 Long term (current) use of aspirin: Secondary | ICD-10-CM | POA: Diagnosis not present

## 2020-02-21 DIAGNOSIS — F419 Anxiety disorder, unspecified: Secondary | ICD-10-CM | POA: Diagnosis present

## 2020-02-21 DIAGNOSIS — Z79899 Other long term (current) drug therapy: Secondary | ICD-10-CM | POA: Diagnosis not present

## 2020-02-21 DIAGNOSIS — Z6836 Body mass index (BMI) 36.0-36.9, adult: Secondary | ICD-10-CM | POA: Diagnosis not present

## 2020-02-21 DIAGNOSIS — E669 Obesity, unspecified: Secondary | ICD-10-CM | POA: Diagnosis present

## 2020-02-21 DIAGNOSIS — Z79890 Hormone replacement therapy (postmenopausal): Secondary | ICD-10-CM | POA: Diagnosis not present

## 2020-02-21 DIAGNOSIS — E1140 Type 2 diabetes mellitus with diabetic neuropathy, unspecified: Secondary | ICD-10-CM | POA: Diagnosis present

## 2020-02-21 DIAGNOSIS — U071 COVID-19: Secondary | ICD-10-CM | POA: Diagnosis present

## 2020-02-21 LAB — C-REACTIVE PROTEIN: CRP: 3.3 mg/dL — ABNORMAL HIGH (ref <1.0)

## 2020-02-21 LAB — CBC WITH DIFFERENTIAL/PLATELET
Abs Immature Granulocytes: 0.06 10*3/uL (ref 0.00–0.07)
Basophils Absolute: 0 10*3/uL (ref 0.0–0.1)
Basophils Relative: 0 %
Eosinophils Absolute: 0 10*3/uL (ref 0.0–0.5)
Eosinophils Relative: 0 %
HCT: 32.6 % — ABNORMAL LOW (ref 39.0–52.0)
Hemoglobin: 10.4 g/dL — ABNORMAL LOW (ref 13.0–17.0)
Immature Granulocytes: 1 %
Lymphocytes Relative: 12 %
Lymphs Abs: 0.8 10*3/uL (ref 0.7–4.0)
MCH: 27.2 pg (ref 26.0–34.0)
MCHC: 31.9 g/dL (ref 30.0–36.0)
MCV: 85.3 fL (ref 80.0–100.0)
Monocytes Absolute: 0.2 10*3/uL (ref 0.1–1.0)
Monocytes Relative: 2 %
Neutro Abs: 5.2 10*3/uL (ref 1.7–7.7)
Neutrophils Relative %: 85 %
Platelets: 218 10*3/uL (ref 150–400)
RBC: 3.82 MIL/uL — ABNORMAL LOW (ref 4.22–5.81)
RDW: 15.5 % (ref 11.5–15.5)
WBC: 6.2 10*3/uL (ref 4.0–10.5)
nRBC: 0 % (ref 0.0–0.2)

## 2020-02-21 LAB — COMPREHENSIVE METABOLIC PANEL
ALT: 35 U/L (ref 0–44)
AST: 68 U/L — ABNORMAL HIGH (ref 15–41)
Albumin: 3.8 g/dL (ref 3.5–5.0)
Alkaline Phosphatase: 68 U/L (ref 38–126)
Anion gap: 14 (ref 5–15)
BUN: 48 mg/dL — ABNORMAL HIGH (ref 8–23)
CO2: 22 mmol/L (ref 22–32)
Calcium: 8.5 mg/dL — ABNORMAL LOW (ref 8.9–10.3)
Chloride: 99 mmol/L (ref 98–111)
Creatinine, Ser: 1.58 mg/dL — ABNORMAL HIGH (ref 0.61–1.24)
GFR, Estimated: 47 mL/min — ABNORMAL LOW (ref >60)
Glucose, Bld: 476 mg/dL — ABNORMAL HIGH (ref 70–99)
Potassium: 3.9 mmol/L (ref 3.5–5.1)
Sodium: 135 mmol/L (ref 135–145)
Total Bilirubin: 0.7 mg/dL (ref 0.3–1.2)
Total Protein: 7.5 g/dL (ref 6.5–8.1)

## 2020-02-21 LAB — HEMOGLOBIN A1C
Hgb A1c MFr Bld: 8.8 % — ABNORMAL HIGH (ref 4.8–5.6)
Mean Plasma Glucose: 205.86 mg/dL

## 2020-02-21 LAB — GLUCOSE, CAPILLARY
Glucose-Capillary: 469 mg/dL — ABNORMAL HIGH (ref 70–99)
Glucose-Capillary: 493 mg/dL — ABNORMAL HIGH (ref 70–99)
Glucose-Capillary: 329 mg/dL — ABNORMAL HIGH (ref 70–99)
Glucose-Capillary: 135 mg/dL — ABNORMAL HIGH (ref 70–99)

## 2020-02-21 LAB — FIBRIN DERIVATIVES D-DIMER (ARMC ONLY): Fibrin derivatives D-dimer (ARMC): 1453.73 ng/mL (FEU) — ABNORMAL HIGH (ref 0.00–499.00)

## 2020-02-21 LAB — HIV ANTIBODY (ROUTINE TESTING W REFLEX): HIV Screen 4th Generation wRfx: NONREACTIVE

## 2020-02-21 MED ORDER — SODIUM CHLORIDE 0.9 % IV SOLN: INTRAVENOUS | Status: DC

## 2020-02-21 MED ORDER — POLYVINYL ALCOHOL 1.4 % OP SOLN
1.0000 [drp] | OPHTHALMIC | Status: DC | PRN
  Administered 2020-02-21: 21:00:00 1 [drp] via OPHTHALMIC
  Filled 2020-02-21 (×2): qty 15

## 2020-02-21 MED ORDER — TRAZODONE HCL 50 MG PO TABS
50.0000 mg | ORAL_TABLET | Freq: Every day | ORAL | Status: DC
  Administered 2020-02-21 – 2020-02-22 (×2): 50 mg via ORAL
  Filled 2020-02-21 (×2): qty 1

## 2020-02-21 NOTE — Hospital Course (Addendum)
70 year old male with past medical history of hypertension, insulin-dependent type 2 diabetes, obesity presented to the ED for progressively worsening shortness of breath worse on exertional and with conversation with onset about 2 weeks ago, and generalized weakness.  He has been diagnosed with COVID-19 previously, onset of symptoms was 1/15, tested positive on 1/18.  Also reported loss of taste and smell, poor p.o. intake, fevers with T-max 101.  Admitted for further management of acute respiratory failure with hypoxia in the setting of Covid 19 pneumonia.  Started on remdesivir and IV steroids.  2 L/min oxygen requirement upon admission.  This was able to be weaned by the next day.  Due to severe hyperglycemia with steroids, steroids were stopped.  [name] also admitted on 1/30.

## 2020-02-21 NOTE — Progress Notes (Signed)
Inpatient Diabetes Program Recommendations  AACE/ADA: New Consensus Statement on Inpatient Glycemic Control (2015)  Target Ranges:  Prepandial:   less than 140 mg/dL      Peak postprandial:   less than 180 mg/dL (1-2 hours)      Critically ill patients:  140 - 180 mg/dL   Lab Results  Component Value Date   GLUCAP 469 (H) 02/21/2020   HGBA1C 8.8 (H) 02/21/2020    Review of Glycemic Control Results for Nicholas Humphrey, Nicholas Humphrey (MRN [id number]) as of 02/21/2020 11:03  Ref. Range 02/20/2020 23:33 02/21/2020 09:52  Glucose-Capillary Latest Ref Range: 70 - 99 mg/dL 323 (H) 469 (H)   Diabetes history: DM2 Outpatient Diabetes medications: Tresiba 124 units + Novolog ac meals 07-16-43 + Metformin 1 gm am + 1.5 gm pm + Actos 45 mg qd Current orders for Inpatient glycemic control: Lantus 124 units + Novolog meal coverage 07-16-43 ac meals + Novolog 0-20 units correction tid + hs 0-5 units + Solumedrol 114 mg q 12 hrs. Until transitioned to Prednisone 50 qd starting 02/24/20.  Inpatient Diabetes Program Recommendations:   While in the hospital, carbohydrate intake will vary compared to home. Consider: -Decrease in Novolog meal coverage to 10 units tid if eats 50% Secure chat sent to Dr. Nicole Kindred.  Thank you, Nani Gasser. Sedona Wenk, RN, MSN, CDE  Diabetes Coordinator Inpatient Glycemic Control Team Team Pager [phone] (8am-5pm) 02/21/2020 11:36 AM

## 2020-02-21 NOTE — Plan of Care (Signed)
  Problem: Education: Goal: Knowledge of risk factors and measures for prevention of condition will improve Outcome: Progressing   

## 2020-02-21 NOTE — Progress Notes (Signed)
PROGRESS NOTE    Nicholas Humphrey   IRW:[id number]  DOB: 02-Apr-1950  PCP: Sallee Lange, NP    DOA: 02/20/2020 LOS: 0   Brief Narrative / Hospital Course to-date   70 year old male with past medical history of hypertension, insulin-dependent type 2 diabetes, obesity presented to the ED for progressively worsening shortness of breath worse on exertional and with conversation with onset about 2 weeks ago, and generalized weakness.  He has been diagnosed with COVID-19 previously, onset of symptoms was 1/15, tested positive on 1/18.  Also reported loss of taste and smell, poor p.o. intake, fevers with T-max 101.  Admitted for further management of acute respiratory failure with hypoxia in the setting of Covid 19 pneumonia.  Started on remdesivir and IV steroids.  2 L/min oxygen requirement upon admission.  This was able to be weaned by the next day.  Due to severe hyperglycemia with steroids, steroids were stopped.  [name] also admitted on 1/30.     Significant Events: -admitted 02/20/2020  Date of +Covid Test: 02/08/2020, symptom onset ~1/15  Vaccination status: unvaccinated  Oxygen requirements: 2 L/min  Assessment & Plan   Active Problems:   Acute hypoxemic respiratory failure (HCC)   AKI (acute kidney injury) ([place])   Metabolic syndrome   Essential hypertension   Insulin dependent type 2 diabetes mellitus ([place])   Acute respiratory failure with hypoxia (HCC)   Acute respiratory failure with hypoxia secondary to COVID-19 pneumonia --Continue remdesivir --Stop steroids due to severe hypoglycemia and no longer hypoxic --Supportive care with bronchodilators antitussives vitamins, pulmonary hygiene --Maintain O2 sat at or above 88%  Acute kidney injury -received IV fluids in the ED and on admission. Presented with creatinine 1.67>>1.58. --Resume IV hydration given little improvement  Metabolic syndrome Insulin-dependent type 2 diabetes Steroid-induced  hyperglycemia  --Continued on home basal insulin 124 units daily --Mealtime NovoLog per orders plus sliding scale  BPH -continue Flomax  Hyperlipidemia -continue Zocor  Depression -continue home Paxil  GERD -continue PPI  Hypothyroidism -continue levothyroxine  Obesity: Body mass index is 36.16 kg/m.  Complicates overall care and prognosis  DVT prophylaxis: Place TED hose Start: 02/20/20 1927   Diet:  Diet Orders (From admission, onward)    Start     Ordered   02/20/20 1927  Diet heart healthy/carb modified Room service appropriate? Yes; Fluid consistency: Thin  Diet effective now       Question Answer Comment  Diet-HS Snack? Nothing   Room service appropriate? Yes   Fluid consistency: Thin      02/20/20 1927            Code Status: Full Code    Subjective 02/21/20    Patient sitting up in chair when seen today.  Reports he is feeling better.  Has been weaned off oxygen.  Reports nonproductive cough.  No fevers or chills.  No shortness of breath at rest or when up to bathroom.   Disposition Plan & Communication   Status is: Inpatient  Inpatient status is appropriate due to severity of illness and use of IV therapies as above  Dispo: The patient is from: Home               Anticipated d/c is to: Home              Anticipated d/c date is: 1 to 2 days              Patient currently is not medically stable for discharge  Difficult to place patient -no   Family Communication: Wife is also admitted, updated   Consults, Procedures, Treatments   Consultants:   None  Procedures:   None  Covid-specific treatments: - Remdesivir 1/30 >> - Solu-medrol wt-based 1/30 >>  Antimicrobials:  Anti-infectives (From admission, onward)   Start     Dose/Rate Route Frequency Ordered Stop   02/21/20 1000  remdesivir 100 mg in sodium chloride 0.9 % 100 mL IVPB       "Followed by" Linked Group Details   100 mg 200 mL/hr over 30 Minutes Intravenous Daily 02/20/20  1625 02/25/20 0959   02/20/20 1730  remdesivir 200 mg in sodium chloride 0.9% 250 mL IVPB       "Followed by" Linked Group Details   200 mg 580 mL/hr over 30 Minutes Intravenous Once 02/20/20 1625 02/20/20 1828        Objective   Vitals:   02/21/20 1100 02/21/20 1138 02/21/20 1809 02/21/20 2005  BP:  (!) 150/74 128/62 (!) 145/58  Pulse:  78 79 79  Resp:  20 17 17   Temp:  97.7 F (36.5 C) 98.6 F (37 C) 97.8 F (36.6 C)  TempSrc:  Oral Oral   SpO2: 100% 100% 100% 98%  Weight:      Height:        Intake/Output Summary (Last 24 hours) at 02/21/2020 2215 Last data filed at 02/21/2020 1626 Gross per 24 hour  Intake 475.44 ml  Output 450 ml  Net 25.44 ml   Filed Weights   02/20/20 1142  Weight: 114.3 kg    Physical Exam:  General exam: awake, alert, no acute distress, obese HEENT: moist mucus membranes, hearing grossly normal, normal dentition Respiratory system: CTAB, no wheezes, rales or rhonchi, normal respiratory effort on room air. Cardiovascular system: normal S1/S2, RRR, no JVD, murmurs, rubs, gallops, no pedal edema.   Gastrointestinal system: soft, NT, ND, no HSM felt, +bowel sounds. Central nervous system: A&O 4. no gross focal neurologic deficits, normal speech Extremities: moves all, no cyanosis, normal tone Psychiatry: normal mood, congruent affect, judgement and insight appear normal  Labs   Data Reviewed: I have personally reviewed following labs and imaging studies  CBC: Recent Labs  Lab 02/20/20 1156 02/21/20 0541  WBC 7.9 6.2  NEUTROABS  --  5.2  HGB 11.9* 10.4*  HCT 36.8* 32.6*  MCV 84.2 85.3  PLT 212 220   Basic Metabolic Panel: Recent Labs  Lab 02/20/20 1156 02/20/20 1520 02/21/20 0541  NA 139 138 135  K 3.9 3.5 3.9  CL 95* 95* 99  CO2 24 24 22   GLUCOSE 156* 188* 476*  BUN 53* 51* 48*  CREATININE 1.64* 1.67* 1.58*  CALCIUM 9.4 8.9 8.5*   GFR: Estimated Creatinine Clearance: 56.6 mL/min (A) (by C-G formula based on SCr of  1.58 mg/dL (H)). Liver Function Tests: Recent Labs  Lab 02/20/20 1156 02/21/20 0541  AST 71* 68*  ALT 36 35  ALKPHOS 73 68  BILITOT 1.0 0.7  PROT 8.5* 7.5  ALBUMIN 4.2 3.8   No results for input(s): LIPASE, AMYLASE in the last 168 hours. No results for input(s): AMMONIA in the last 168 hours. Coagulation Profile: No results for input(s): INR, PROTIME in the last 168 hours. Cardiac Enzymes: No results for input(s): CKTOTAL, CKMB, CKMBINDEX, TROPONINI in the last 168 hours. BNP (last 3 results) No results for input(s): PROBNP in the last 8760 hours. HbA1C: Recent Labs    02/21/20 0541  HGBA1C 8.8*  CBG: Recent Labs  Lab 02/20/20 2333 02/21/20 0952 02/21/20 1140 02/21/20 1646 02/21/20 2124  GLUCAP 323* 469* 493* 329* 135*   Lipid Profile: No results for input(s): CHOL, HDL, LDLCALC, TRIG, CHOLHDL, LDLDIRECT in the last 72 hours. Thyroid Function Tests: No results for input(s): TSH, T4TOTAL, FREET4, T3FREE, THYROIDAB in the last 72 hours. Anemia Panel: No results for input(s): VITAMINB12, FOLATE, FERRITIN, TIBC, IRON, RETICCTPCT in the last 72 hours. Sepsis Labs: Recent Labs  Lab 02/20/20 1520  PROCALCITON <0.10    No results found for this or any previous visit (from the past 240 hour(s)).    Imaging Studies   DG Chest 2 View  Result Date: 02/20/2020 CLINICAL DATA:  Shortness of breath, cough, increasing weakness for several weeks, tested positive for COVID-19 3 weeks ago, nausea EXAM: CHEST - 2 VIEW COMPARISON:  07/19/2019 FINDINGS: Normal heart size, mediastinal contours, and pulmonary vascularity. Minimal patchy infiltrate in periphery of lower LEFT lung. Remaining lungs clear. No pleural effusion or pneumothorax. Bones demineralized. IMPRESSION: Minimal patchy infiltrate at lateral lower LEFT lung question pneumonia. Electronically Signed   By: Lavonia Dana M.D.   On: 02/20/2020 12:46     Medications   Scheduled Meds: . vitamin C  500 mg Oral Daily  .  aspirin EC  81 mg Oral Daily  . enoxaparin (LOVENOX) injection  0.5 mg/kg Subcutaneous Q24H  . hydrALAZINE  25 mg Oral Q8H  . insulin aspart  0-20 Units Subcutaneous TID WC  . insulin aspart  0-5 Units Subcutaneous QHS  . insulin aspart  6 Units Subcutaneous QAC breakfast   And  . insulin aspart  25 Units Subcutaneous Q lunch   And  . insulin aspart  45 Units Subcutaneous QAC supper  . insulin glargine  124 Units Subcutaneous Daily  . levothyroxine  137 mcg Oral Once per day on Mon Tue Wed Thu Fri Sat  . multivitamin with minerals  1 tablet Oral Daily  . pantoprazole  80 mg Oral Q1200  . PARoxetine  20 mg Oral Daily  . polyethylene glycol  17 g Oral BID  . simvastatin  40 mg Oral QHS  . tamsulosin  0.4 mg Oral Daily  . traZODone  50 mg Oral QHS  . zinc sulfate  220 mg Oral Daily   Continuous Infusions: . remdesivir 100 mg in NS 100 mL Stopped (02/21/20 1104)       LOS: 0 days    Time spent: 30 minutes    Ezekiel Slocumb, DO [facility]  02/21/2020, 10:15 PM    If 7PM-7AM, please contact night-coverage. How to contact the [facility] Attending or Consulting provider [place] or covering provider during after hours [place], for this patient?    1. Check the care team in [facility] and look for a) attending/consulting [facility] provider listed and b) the [facility] team listed 2. Log into [email or web address] and use [place]'s universal password to access. If you do not have the password, please contact the hospital operator. 3. Locate the [facility] provider you are looking for under [facility] and page to a number that you can be directly reached. 4. If you still have difficulty reaching the provider, please page the [facility] (Director on Call) for the Hospitalists listed on amion for assistance.

## 2020-02-22 DIAGNOSIS — R0902 Hypoxemia: Secondary | ICD-10-CM | POA: Diagnosis present

## 2020-02-22 DIAGNOSIS — I10 Essential (primary) hypertension: Secondary | ICD-10-CM

## 2020-02-22 DIAGNOSIS — E119 Type 2 diabetes mellitus without complications: Secondary | ICD-10-CM

## 2020-02-22 DIAGNOSIS — Z794 Long term (current) use of insulin: Secondary | ICD-10-CM

## 2020-02-22 LAB — COMPREHENSIVE METABOLIC PANEL
ALT: 54 U/L — ABNORMAL HIGH (ref 0–44)
AST: 193 U/L — ABNORMAL HIGH (ref 15–41)
Albumin: 4.2 g/dL (ref 3.5–5.0)
Alkaline Phosphatase: 74 U/L (ref 38–126)
Anion gap: 13 (ref 5–15)
BUN: 51 mg/dL — ABNORMAL HIGH (ref 8–23)
CO2: 26 mmol/L (ref 22–32)
Calcium: 9.1 mg/dL (ref 8.9–10.3)
Chloride: 105 mmol/L (ref 98–111)
Creatinine, Ser: 1.38 mg/dL — ABNORMAL HIGH (ref 0.61–1.24)
GFR, Estimated: 56 mL/min — ABNORMAL LOW (ref >60)
Glucose, Bld: 83 mg/dL (ref 70–99)
Potassium: 3.7 mmol/L (ref 3.5–5.1)
Sodium: 144 mmol/L (ref 135–145)
Total Bilirubin: 0.7 mg/dL (ref 0.3–1.2)
Total Protein: 8 g/dL (ref 6.5–8.1)

## 2020-02-22 LAB — CBC WITH DIFFERENTIAL/PLATELET
Abs Immature Granulocytes: 0.24 10*3/uL — ABNORMAL HIGH (ref 0.00–0.07)
Basophils Absolute: 0 10*3/uL (ref 0.0–0.1)
Basophils Relative: 0 %
Eosinophils Absolute: 0 10*3/uL (ref 0.0–0.5)
Eosinophils Relative: 0 %
HCT: 36.3 % — ABNORMAL LOW (ref 39.0–52.0)
Hemoglobin: 11.6 g/dL — ABNORMAL LOW (ref 13.0–17.0)
Immature Granulocytes: 1 %
Lymphocytes Relative: 6 %
Lymphs Abs: 1.4 10*3/uL (ref 0.7–4.0)
MCH: 27.4 pg (ref 26.0–34.0)
MCHC: 32 g/dL (ref 30.0–36.0)
MCV: 85.8 fL (ref 80.0–100.0)
Monocytes Absolute: 1.1 10*3/uL — ABNORMAL HIGH (ref 0.1–1.0)
Monocytes Relative: 5 %
Neutro Abs: 19.8 10*3/uL — ABNORMAL HIGH (ref 1.7–7.7)
Neutrophils Relative %: 88 %
Platelets: 275 10*3/uL (ref 150–400)
RBC: 4.23 MIL/uL (ref 4.22–5.81)
RDW: 15.6 % — ABNORMAL HIGH (ref 11.5–15.5)
WBC: 22.6 10*3/uL — ABNORMAL HIGH (ref 4.0–10.5)
nRBC: 0 % (ref 0.0–0.2)

## 2020-02-22 LAB — GLUCOSE, CAPILLARY
Glucose-Capillary: 72 mg/dL (ref 70–99)
Glucose-Capillary: 222 mg/dL — ABNORMAL HIGH (ref 70–99)
Glucose-Capillary: 275 mg/dL — ABNORMAL HIGH (ref 70–99)
Glucose-Capillary: 345 mg/dL — ABNORMAL HIGH (ref 70–99)

## 2020-02-22 LAB — C-REACTIVE PROTEIN: CRP: 1.6 mg/dL — ABNORMAL HIGH (ref <1.0)

## 2020-02-22 LAB — FIBRIN DERIVATIVES D-DIMER (ARMC ONLY): Fibrin derivatives D-dimer (ARMC): 1289.01 ng/mL (FEU) — ABNORMAL HIGH (ref 0.00–499.00)

## 2020-02-22 MED ORDER — INSULIN ASPART 100 UNIT/ML ~~LOC~~ SOLN: 35.0000 [IU] | Freq: Every day | SUBCUTANEOUS | Status: DC

## 2020-02-22 MED ORDER — ENSURE MAX PROTEIN PO LIQD
11.0000 [oz_av] | Freq: Two times a day (BID) | ORAL | Status: DC
  Administered 2020-02-22 – 2020-02-23 (×2): 11 [oz_av] via ORAL
  Filled 2020-02-22: qty 330

## 2020-02-22 MED ORDER — INSULIN ASPART 100 UNIT/ML ~~LOC~~ SOLN: 10.0000 [IU] | Freq: Every day | SUBCUTANEOUS | Status: DC

## 2020-02-22 MED ORDER — BENZONATATE 100 MG PO CAPS
200.0000 mg | ORAL_CAPSULE | Freq: Three times a day (TID) | ORAL | Status: DC
  Administered 2020-02-22 – 2020-02-23 (×3): 200 mg via ORAL
  Filled 2020-02-22 (×3): qty 2

## 2020-02-22 MED ORDER — INSULIN ASPART 100 UNIT/ML ~~LOC~~ SOLN
10.0000 [IU] | Freq: Three times a day (TID) | SUBCUTANEOUS | Status: DC
  Administered 2020-02-22 – 2020-02-23 (×3): 10 [IU] via SUBCUTANEOUS
  Filled 2020-02-22 (×3): qty 1

## 2020-02-22 MED ORDER — INSULIN ASPART 100 UNIT/ML ~~LOC~~ SOLN: 6.0000 [IU] | Freq: Every day | SUBCUTANEOUS | Status: DC

## 2020-02-22 MED ORDER — INSULIN ASPART 100 UNIT/ML ~~LOC~~ SOLN: 25.0000 [IU] | Freq: Every day | SUBCUTANEOUS | Status: DC

## 2020-02-22 NOTE — Progress Notes (Addendum)
PROGRESS NOTE    Nicholas Humphrey   ERX:[id number]  DOB: [birth year]  PCP: Sallee Lange, NP    DOA: 02/20/2020 LOS: 1   Brief Narrative / Hospital Course to-date   70 year old male with past medical history of hypertension, insulin-dependent type 2 diabetes, obesity presented to the ED for progressively worsening shortness of breath worse on exertional and with conversation with onset about 2 weeks ago, and generalized weakness.  He has been diagnosed with COVID-19 previously, onset of symptoms was 1/15, tested positive on 1/18.  Also reported loss of taste and smell, poor p.o. intake, fevers with T-max 101.  Admitted for further management of acute respiratory failure with hypoxia in the setting of Covid 19 pneumonia.  Started on remdesivir and IV steroids.  2 L/min oxygen requirement upon admission.  This was able to be weaned by the next day.  Due to severe hyperglycemia with steroids, steroids were stopped.  [name] also admitted on 1/30.     Significant Events: -admitted 02/20/2020  Date of +Covid Test: 02/08/2020, symptom onset ~1/15  Vaccination status: unvaccinated  Oxygen requirements: 2 L/min  Assessment & Plan   Active Problems:   Acute hypoxemic respiratory failure (HCC)   AKI (acute kidney injury) ([place])   Metabolic syndrome   Essential hypertension   Insulin dependent type 2 diabetes mellitus ([place])   Acute respiratory failure with hypoxia ([place])   COVID-19 pneumonia with transient hypoxia Briefly required 2 L/min oxygen but quickly weaned off. --Continue remdesivir --Stopped steroids 1/31 due to severe hyperglycemia and no longer hypoxic --Supportive care with bronchodilators, antitussives vitamins, pulmonary hygiene --Maintain O2 sat at or above 88%  Acute hypoxemic respiratory failure not clinically supported  Acute kidney injury -received IV fluids in the ED and on admission.  Improving with IV hydration. Baseline creatinine appears to  be 0.8-1.0 on chart review. Presented with creatinine 1.67>>1.58>> 1.38. --Continue IV fluids --Monitor BMP --Avoid nephrotoxins and hypotension, renally dose meds as needed  Metabolic syndrome Insulin-dependent type 2 diabetes Steroid-induced hyperglycemia  --Continued on home basal insulin 124 units daily --Mealtime NovoLog per orders plus sliding scale  BPH -continue Flomax  Hyperlipidemia -continue Zocor  Depression -continue home Paxil  GERD -continue PPI  Hypothyroidism -continue levothyroxine  Obesity: Body mass index is 36.16 kg/m.  Complicates overall care and prognosis  DVT prophylaxis: Place TED hose Start: 02/20/20 1927   Diet:  Diet Orders (From admission, onward)     Start     Ordered   02/20/20 1927  Diet heart healthy/carb modified Room service appropriate? Yes; Fluid consistency: Thin  Diet effective now       Question Answer Comment  Diet-HS Snack? Nothing   Room service appropriate? Yes   Fluid consistency: Thin      02/20/20 1927              Code Status: Full Code    Subjective 02/22/20    Patient up in chair when seen today.  He and wife have been moved to the same room.  He reports feeling better but has developed more of a cough.  Cough is nonproductive.  Has not required additional oxygen since weaned off yesterday.  No fevers chills, chest pain, nausea vomiting or other acute complaints.   Disposition Plan & Communication   Status is: Inpatient  Inpatient status is appropriate due to severity of illness and use of IV therapies as above.   Requiring IV fluids for AKI.  Anticipate medically stable for discharge  once creatinine further improved, possibly tomorrow.  Dispo: The patient is from: Home               Anticipated d/c is to: Home              Anticipated d/c date is: 1 to 2 days              Patient currently is not medically stable for discharge   Difficult to place patient -no   Family Communication: Wife is also  admitted, updated at bedside   Consults, Procedures, Treatments   Consultants:  None  Procedures:  None  Covid-specific treatments: - Remdesivir 1/30 >> - Solu-medrol wt-based 1/30 >>1/31  Antimicrobials:  Anti-infectives (From admission, onward)    Start     Dose/Rate Route Frequency Ordered Stop   02/21/20 1000  remdesivir 100 mg in sodium chloride 0.9 % 100 mL IVPB       "Followed by" Linked Group Details   100 mg 200 mL/hr over 30 Minutes Intravenous Daily 02/20/20 1625 02/25/20 0959   02/20/20 1730  remdesivir 200 mg in sodium chloride 0.9% 250 mL IVPB       "Followed by" Linked Group Details   200 mg 580 mL/hr over 30 Minutes Intravenous Once 02/20/20 1625 02/20/20 1828         Objective   Vitals:   02/21/20 2320 02/22/20 0518 02/22/20 0807 02/22/20 1553  BP: 129/66 (!) 150/81 (!) 145/71 122/62  Pulse: 70 60 68 77  Resp: 16 18 18 18   Temp: (!) 97.5 F (36.4 C) (!) 97.5 F (36.4 C) (!) 97.5 F (36.4 C) 97.8 F (36.6 C)  TempSrc:  Oral    SpO2: 96% 98% 97% 98%  Weight:      Height:        Intake/Output Summary (Last 24 hours) at 02/22/2020 1605 Last data filed at 02/22/2020 0800 Gross per 24 hour  Intake 115.44 ml  Output 650 ml  Net -534.56 ml   Filed Weights   02/20/20 1142  Weight: 114.3 kg    Physical Exam:  General exam: awake, alert, no acute distress, obese Respiratory system: CTAB, no wheezes or rhonchi, on room air, normal respiratory effort Cardiovascular system: normal S1/S2, RRR, no peripheral edema Gastrointestinal system: soft, NT, ND Central nervous system: A&O 4. no gross focal neurologic deficits, normal speech Psychiatry: normal mood, congruent affect, judgement and insight appear normal  Labs   Data Reviewed: I have personally reviewed following labs and imaging studies  CBC: Recent Labs  Lab 02/20/20 1156 02/21/20 0541 02/22/20 0723  WBC 7.9 6.2 22.6*  NEUTROABS  --  5.2 19.8*  HGB 11.9* 10.4* 11.6*  HCT 36.8*  32.6* 36.3*  MCV 84.2 85.3 85.8  PLT 212 218 [id number]   Basic Metabolic Panel: Recent Labs  Lab 02/20/20 1156 02/20/20 1520 02/21/20 0541 02/22/20 0723  NA 139 138 135 144  K 3.9 3.5 3.9 3.7  CL 95* 95* 99 105  CO2 24 24 22 26   GLUCOSE 156* 188* 476* 83  BUN 53* 51* 48* 51*  CREATININE 1.64* 1.67* 1.58* 1.38*  CALCIUM 9.4 8.9 8.5* 9.1   GFR: Estimated Creatinine Clearance: 64.9 mL/min (A) (by C-G formula based on SCr of 1.38 mg/dL (H)). Liver Function Tests: Recent Labs  Lab 02/20/20 1156 02/21/20 0541 02/22/20 0723  AST 71* 68* 193*  ALT 36 35 54*  ALKPHOS 73 68 74  BILITOT 1.0 0.7 0.7  PROT 8.5* 7.5 8.0  ALBUMIN  4.2 3.8 4.2   No results for input(s): LIPASE, AMYLASE in the last 168 hours. No results for input(s): AMMONIA in the last 168 hours. Coagulation Profile: No results for input(s): INR, PROTIME in the last 168 hours. Cardiac Enzymes: No results for input(s): CKTOTAL, CKMB, CKMBINDEX, TROPONINI in the last 168 hours. BNP (last 3 results) No results for input(s): PROBNP in the last 8760 hours. HbA1C: Recent Labs    02/21/20 0541  HGBA1C 8.8*   CBG: Recent Labs  Lab 02/21/20 1646 02/21/20 2124 02/22/20 0809 02/22/20 1148 02/22/20 1553  GLUCAP 329* 135* 72 222* 275*   Lipid Profile: No results for input(s): CHOL, HDL, LDLCALC, TRIG, CHOLHDL, LDLDIRECT in the last 72 hours. Thyroid Function Tests: No results for input(s): TSH, T4TOTAL, FREET4, T3FREE, THYROIDAB in the last 72 hours. Anemia Panel: No results for input(s): VITAMINB12, FOLATE, FERRITIN, TIBC, IRON, RETICCTPCT in the last 72 hours. Sepsis Labs: Recent Labs  Lab 02/20/20 1520  PROCALCITON <0.10    No results found for this or any previous visit (from the past 240 hour(s)).    Imaging Studies   No results found.   Medications   Scheduled Meds:  vitamin C  500 mg Oral Daily   aspirin EC  81 mg Oral Daily   benzonatate  200 mg Oral TID   enoxaparin (LOVENOX) injection  0.5  mg/kg Subcutaneous Q24H   hydrALAZINE  25 mg Oral Q8H   insulin aspart  0-20 Units Subcutaneous TID WC   insulin aspart  0-5 Units Subcutaneous QHS   insulin aspart  10 Units Subcutaneous TID WC   insulin glargine  124 Units Subcutaneous Daily   levothyroxine  137 mcg Oral Once per day on Mon Tue Wed Thu Fri Sat   multivitamin with minerals  1 tablet Oral Daily   pantoprazole  80 mg Oral Q1200   PARoxetine  20 mg Oral Daily   polyethylene glycol  17 g Oral BID   Ensure Max Protein  11 oz Oral BID   simvastatin  40 mg Oral QHS   tamsulosin  0.4 mg Oral Daily   traZODone  50 mg Oral QHS   zinc sulfate  220 mg Oral Daily   Continuous Infusions:  sodium chloride 75 mL/hr at 02/21/20 2335   remdesivir 100 mg in NS 100 mL 100 mg (02/22/20 1026)       LOS: 1 day    Time spent: 25 minutes with greater than 50% spent in coordination of care and at bedside.    Ezekiel Slocumb, DO [facility]  02/22/2020, 4:05 PM    If 7PM-7AM, please contact night-coverage. How to contact the [facility] Attending or Consulting provider [place] or covering provider during after hours [place], for this patient?    Check the care team in [facility] and look for a) attending/consulting [facility] provider listed and b) the [facility] team listed Log into [email or web address] and use [place] universal password to access. If you do not have the password, please contact the hospital operator. Locate the [facility] provider you are looking for under [facility] and page to a number that you can be directly reached. If you still have difficulty reaching the provider, please page the [facility] (Director on Call) for the Hospitalists listed on amion for assistance.

## 2020-02-22 NOTE — Progress Notes (Signed)
Initial Nutrition Assessment  DOCUMENTATION CODES:   Obesity unspecified  INTERVENTION:  Ensure Max po BID, each supplement provides 150 kcal and 30 grams of protein (chocolate)   NUTRITION DIAGNOSIS:   Increased nutrient needs related to catabolic illness ([id number] virus infection) as evidenced by estimated needs.    GOAL:   Patient will meet greater than or equal to 90% of their needs    MONITOR:   Labs,I & O's,Supplement acceptance,PO intake,Weight trends,Skin  REASON FOR ASSESSMENT:   Other (Comment) (Pt and spouse(identified on MST) sharing a room. Decreased po intake >2weeks reported per pt)    ASSESSMENT:  70 year old male admitted with acute hypoxemic respiratory failure secondary to COVID-19 infection and AKI after presenting with 2 week onset of SOB, decreased po intake, lack of taste and smell and muscle weakness. Past medical history significant of HTN, HLD, IDDM2, diabetic neuropathy, and obesity.  1/18- tested positive for Covid-19   RD working remotely.  Pt and his wife admitted on 1/30 and sharing a room at this time.  Spoke with pt via phone during call made to pt spouse who was identified on MST report. Prior to admission, pt reports ~2 weeks eating poorly due to lack of taste and overall feeling bad. He recalls eating chicken noodle soup, but mostly pineapple  drinking ginger ale because those are the things he could taste. Pt says his appetite has improved and has been good since admission. Recalls scrambled eggs with cheese, potatoes, oatmeal with cinnamon, fruit, and orange juice for breakfast, ate everything except for a couple of bites of egg.  RD discussed increased needs secondary to Covid-19 infection and the importance of nutrition. Pt occasionally drinks Ensure at home and is agreeable to drinking chocolate Ensure Max during admission to help him meet his needs.   Weight history reviewed, stable over the last two years.   Steroids have been  stopped due to high blood sugar. Pt reports well controlled DM2 at baseline, blood sugars typically run 90-130 at home.  Medications reviewed and include: Vit C, SSI, Lantus 124 units daily, MVI, Protonix, Miralax, Flomax, Zinc sulfate, Remdesivir  Labs: CBGs [phone] x 24 hours, BUN 51, Cr 1.38, AST 193 (H), ALT 54, WBC 22.6 (H) A1c 8.8 (H) on 1/31  NUTRITION - FOCUSED PHYSICAL EXAM:  Unable to complete at this time  Diet Order:   Diet Order            Diet heart healthy/carb modified Room service appropriate? Yes; Fluid consistency: Thin  Diet effective now                 EDUCATION NEEDS:   Education needs have been addressed  Skin:  Skin Assessment: Reviewed RN Assessment  Last BM:  1/31  Height:   Ht Readings from Last 1 Encounters:  02/20/20 5\' 10"  (1.778 m)    Weight:   Wt Readings from Last 1 Encounters:  02/20/20 114.3 kg    BMI:  Body mass index is 36.16 kg/m.  Estimated Nutritional Needs:   Kcal:  0347-4259  Protein:  >151 grams  Fluid:  2.5 L/day   Lajuan Lines, RD, LDN Clinical Nutrition After Hours/Weekend Pager # in [place]

## 2020-02-23 DIAGNOSIS — J9601 Acute respiratory failure with hypoxia: Secondary | ICD-10-CM

## 2020-02-23 DIAGNOSIS — J1282 Pneumonia due to coronavirus disease 2019: Secondary | ICD-10-CM

## 2020-02-23 DIAGNOSIS — N179 Acute kidney failure, unspecified: Secondary | ICD-10-CM

## 2020-02-23 DIAGNOSIS — U071 COVID-19: Principal | ICD-10-CM

## 2020-02-23 LAB — COMPREHENSIVE METABOLIC PANEL
ALT: 73 U/L — ABNORMAL HIGH (ref 0–44)
AST: 186 U/L — ABNORMAL HIGH (ref 15–41)
Albumin: 3.2 g/dL — ABNORMAL LOW (ref 3.5–5.0)
Alkaline Phosphatase: 59 U/L (ref 38–126)
Anion gap: 9 (ref 5–15)
BUN: 42 mg/dL — ABNORMAL HIGH (ref 8–23)
CO2: 25 mmol/L (ref 22–32)
Calcium: 8.2 mg/dL — ABNORMAL LOW (ref 8.9–10.3)
Chloride: 109 mmol/L (ref 98–111)
Creatinine, Ser: 1.08 mg/dL (ref 0.61–1.24)
GFR, Estimated: >60 mL/min (ref >60)
Glucose, Bld: 171 mg/dL — ABNORMAL HIGH (ref 70–99)
Potassium: 3.9 mmol/L (ref 3.5–5.1)
Sodium: 143 mmol/L (ref 135–145)
Total Bilirubin: 0.6 mg/dL (ref 0.3–1.2)
Total Protein: 6 g/dL — ABNORMAL LOW (ref 6.5–8.1)

## 2020-02-23 LAB — CBC WITH DIFFERENTIAL/PLATELET
Abs Immature Granulocytes: 0.08 10*3/uL — ABNORMAL HIGH (ref 0.00–0.07)
Basophils Absolute: 0 10*3/uL (ref 0.0–0.1)
Basophils Relative: 0 %
Eosinophils Absolute: 0 10*3/uL (ref 0.0–0.5)
Eosinophils Relative: 0 %
HCT: 31.6 % — ABNORMAL LOW (ref 39.0–52.0)
Hemoglobin: 10.1 g/dL — ABNORMAL LOW (ref 13.0–17.0)
Immature Granulocytes: 1 %
Lymphocytes Relative: 20 %
Lymphs Abs: 2.4 10*3/uL (ref 0.7–4.0)
MCH: 27.4 pg (ref 26.0–34.0)
MCHC: 32 g/dL (ref 30.0–36.0)
MCV: 85.9 fL (ref 80.0–100.0)
Monocytes Absolute: 0.8 10*3/uL (ref 0.1–1.0)
Monocytes Relative: 7 %
Neutro Abs: 8.6 10*3/uL — ABNORMAL HIGH (ref 1.7–7.7)
Neutrophils Relative %: 72 %
Platelets: 237 10*3/uL (ref 150–400)
RBC: 3.68 MIL/uL — ABNORMAL LOW (ref 4.22–5.81)
RDW: 15.6 % — ABNORMAL HIGH (ref 11.5–15.5)
WBC: 11.9 10*3/uL — ABNORMAL HIGH (ref 4.0–10.5)
nRBC: 0 % (ref 0.0–0.2)

## 2020-02-23 LAB — PHOSPHORUS: Phosphorus: 3.6 mg/dL (ref 2.5–4.6)

## 2020-02-23 LAB — GLUCOSE, CAPILLARY
Glucose-Capillary: 115 mg/dL — ABNORMAL HIGH (ref 70–99)
Glucose-Capillary: 175 mg/dL — ABNORMAL HIGH (ref 70–99)

## 2020-02-23 LAB — C-REACTIVE PROTEIN: CRP: 0.8 mg/dL (ref <1.0)

## 2020-02-23 LAB — FIBRIN DERIVATIVES D-DIMER (ARMC ONLY): Fibrin derivatives D-dimer (ARMC): 978.77 ng/mL (FEU) — ABNORMAL HIGH (ref 0.00–499.00)

## 2020-02-23 LAB — MAGNESIUM: Magnesium: 2 mg/dL (ref 1.7–2.4)

## 2020-02-24 NOTE — Discharge Summary (Signed)
Physician Discharge Summary  Nicholas Humphrey KXF:[id number] DOB: [birth year] DOA: 02/20/2020  PCP: Sallee Lange, NP  Admit date: 02/20/2020 Discharge date: 02/23/2020  Time spent: 35 minutes  Recommendations for Outpatient Follow-up:  1. PCP in 1 week  Discharge Diagnoses:  Active Problems:   Acute kidney injury (HCC)   Metabolic syndrome   Essential hypertension   Insulin dependent type 2 diabetes mellitus ([place])   Acute respiratory failure with hypoxia (HCC)   Hypoxia   Pneumonia due to COVID-19 virus   Discharge Condition: stable  Diet recommendation: low sodium diabetic  Filed Weights   02/20/20 1142  Weight: 114.3 kg    History of present illness:  70 year old male with past medical history of hypertension, insulin-dependent type 2 diabetes, obesity presented to the ED for progressively worsening shortness of breath worse on exertional and with conversation with onset about 2 weeks ago, and generalized weakness.  He has been diagnosed with COVID-19 previously, onset of symptoms was 1/15, tested positive on 1/18.  Also reported loss of taste and smell, poor p.o. intake, fevers with T-max 101.  Hospital Course:   COVID-19 pneumonia with transient hypoxia Briefly required 2 L/min oxygen but quickly weaned off. -Treated with IV remdesivir for 3 days, was also on steroids, this was discontinued by my partner yesterday due to severe hyperglycemia -improved and stable now, weaned off Oxygen -discharged home in a stable condition  Acute kidney injury - -due to dehydration, resolved  Metabolic syndrome Insulin-dependent type 2 diabetes Steroid-induced hyperglycemia  --Continued on home basal insulin 124 units daily --Mealtime NovoLog per orders plus sliding scale  BPH -continue Flomax  Hyperlipidemia -continue Zocor  Depression -continue home Paxil  GERD -continue PPI  Hypothyroidism -continue levothyroxine  Obesity: Body mass index is 36.16  kg/m.  Complicates overall care and prognosis  Discharge Exam: Vitals:   02/23/20 0725 02/23/20 1211  BP: (!) 120/96 (!) 141/68  Pulse: 64 66  Resp: 20 16  Temp: (!) 97.4 F (36.3 C) 97.6 F (36.4 C)  SpO2: 100% 99%    General: AAOx3, no distress CVS: S1-S2, regular rate rhythm Lungs: Clear bilaterally  Discharge Instructions   Discharge Instructions    Diet Carb Modified   Complete by: As directed    Increase activity slowly   Complete by: As directed      Allergies as of 02/23/2020      Reactions   Pregabalin Swelling   Legs and ankles   Oxycodone Nausea And Vomiting   Other reaction(s): Hallucinations, Vomiting   Crestor [rosuvastatin]    Muscle pain   Doxycycline Nausea Only   Lipitor [atorvastatin]    Muscle pain   Tramadol Anxiety   Other reaction(s): Hallucinations      Medication List    STOP taking these medications   hydrochlorothiazide 50 MG tablet Commonly known as: HYDRODIURIL   pantoprazole 40 MG tablet Commonly known as: PROTONIX     TAKE these medications   aspirin 81 MG tablet Take 81 mg by mouth daily.   enalapril 5 MG tablet Commonly known as: VASOTEC Take 5 mg by mouth daily.   esomeprazole 40 MG capsule Commonly known as: NEXIUM Take 40 mg by mouth every morning.   furosemide 20 MG tablet Commonly known as: LASIX Take 20 mg by mouth daily.   gabapentin 300 MG capsule Commonly known as: NEURONTIN Take 300 mg by mouth daily.   insulin aspart 100 UNIT/ML injection Commonly known as: novoLOG Inject 6-45 Units into the skin  See admin instructions. 6 units before breakfast, 25 units before lunch, 45 units before dinner   insulin degludec 200 UNIT/ML FlexTouch Pen Commonly known as: TRESIBA Inject 124 Units into the skin daily.   levothyroxine 137 MCG tablet Commonly known as: SYNTHROID Take 137 mcg by mouth See admin instructions. Mon - Sat; does not take on Sun   metFORMIN 1000 MG tablet Commonly known as:  GLUCOPHAGE Take 1,000-1,500 mg by mouth See admin instructions. 1000 mg in AM. 1500 mg in PM.   montelukast 10 MG tablet Commonly known as: SINGULAIR Take 10 mg by mouth at bedtime.   multivitamin tablet Take 1 tablet by mouth daily.   PARoxetine 20 MG tablet Commonly known as: PAXIL Take 20 mg by mouth daily.   pioglitazone 45 MG tablet Commonly known as: ACTOS Take 45 mg by mouth daily.   simvastatin 40 MG tablet Commonly known as: ZOCOR Take 40 mg by mouth at bedtime.   tamsulosin 0.4 MG Caps capsule Commonly known as: FLOMAX Take 1 capsule (0.4 mg total) by mouth daily.   vitamin B-12 1000 MCG tablet Commonly known as: CYANOCOBALAMIN Take 1,000 mcg by mouth daily.      Allergies  Allergen Reactions  . Pregabalin Swelling    Legs and ankles  . Oxycodone Nausea And Vomiting    Other reaction(s): Hallucinations, Vomiting  . Crestor [Rosuvastatin]     Muscle pain  . Doxycycline Nausea Only  . Lipitor [Atorvastatin]     Muscle pain  . Tramadol Anxiety    Other reaction(s): Hallucinations    Follow-up Information    Gauger, Victoriano Lain, NP. Schedule an appointment as soon as possible for a visit in 1 week(s).   Specialty: Internal Medicine Contact information: [place] [address] [phone]                The results of significant diagnostics from this hospitalization (including imaging, microbiology, ancillary and laboratory) are listed below for reference.    Significant Diagnostic Studies: DG Chest 2 View  Result Date: 02/20/2020 CLINICAL DATA:  Shortness of breath, cough, increasing weakness for several weeks, tested positive for COVID-19 3 weeks ago, nausea EXAM: CHEST - 2 VIEW COMPARISON:  07/19/2019 FINDINGS: Normal heart size, mediastinal contours, and pulmonary vascularity. Minimal patchy infiltrate in periphery of lower LEFT lung. Remaining lungs clear. No pleural effusion or pneumothorax. Bones demineralized.  IMPRESSION: Minimal patchy infiltrate at lateral lower LEFT lung question pneumonia. Electronically Signed   By: Lavonia Dana M.D.   On: 02/20/2020 12:46    Microbiology: No results found for this or any previous visit (from the past 240 hour(s)).   Labs: Basic Metabolic Panel: Recent Labs  Lab 02/20/20 1156 02/20/20 1520 02/21/20 0541 02/22/20 0723 02/23/20 0450  NA 139 138 135 144 143  K 3.9 3.5 3.9 3.7 3.9  CL 95* 95* 99 105 109  CO2 24 24 22 26 25   GLUCOSE 156* 188* 476* 83 171*  BUN 53* 51* 48* 51* 42*  CREATININE 1.64* 1.67* 1.58* 1.38* 1.08  CALCIUM 9.4 8.9 8.5* 9.1 8.2*  MG  --   --   --   --  2.0  PHOS  --   --   --   --  3.6   Liver Function Tests: Recent Labs  Lab 02/20/20 1156 02/21/20 0541 02/22/20 0723 02/23/20 0450  AST 71* 68* 193* 186*  ALT 36 35 54* 73*  ALKPHOS 73 68 74 59  BILITOT 1.0 0.7  0.7 0.6  PROT 8.5* 7.5 8.0 6.0*  ALBUMIN 4.2 3.8 4.2 3.2*   No results for input(s): LIPASE, AMYLASE in the last 168 hours. No results for input(s): AMMONIA in the last 168 hours. CBC: Recent Labs  Lab 02/20/20 1156 02/21/20 0541 02/22/20 0723 02/23/20 0450  WBC 7.9 6.2 22.6* 11.9*  NEUTROABS  --  5.2 19.8* 8.6*  HGB 11.9* 10.4* 11.6* 10.1*  HCT 36.8* 32.6* 36.3* 31.6*  MCV 84.2 85.3 85.8 85.9  PLT 212 218 275 237   Cardiac Enzymes: No results for input(s): CKTOTAL, CKMB, CKMBINDEX, TROPONINI in the last 168 hours. BNP: BNP (last 3 results) No results for input(s): BNP in the last 8760 hours.  ProBNP (last 3 results) No results for input(s): PROBNP in the last 8760 hours.  CBG: Recent Labs  Lab 02/22/20 1148 02/22/20 1553 02/22/20 2042 02/23/20 0823 02/23/20 1208  GLUCAP 222* 275* 345* 115* 175*       Signed:  Domenic Polite MD.  [facility] 02/24/2020, 2:50 PM

## 2020-03-07 DIAGNOSIS — I10 Essential (primary) hypertension: Secondary | ICD-10-CM | POA: Diagnosis not present

## 2020-03-07 DIAGNOSIS — Z09 Encounter for follow-up examination after completed treatment for conditions other than malignant neoplasm: Secondary | ICD-10-CM | POA: Diagnosis not present

## 2020-03-07 DIAGNOSIS — Z79899 Other long term (current) drug therapy: Secondary | ICD-10-CM | POA: Diagnosis not present

## 2020-03-07 DIAGNOSIS — E1169 Type 2 diabetes mellitus with other specified complication: Secondary | ICD-10-CM | POA: Diagnosis not present

## 2020-03-07 DIAGNOSIS — Z794 Long term (current) use of insulin: Secondary | ICD-10-CM | POA: Diagnosis not present

## 2020-03-07 DIAGNOSIS — Z87448 Personal history of other diseases of urinary system: Secondary | ICD-10-CM | POA: Diagnosis not present

## 2020-03-07 DIAGNOSIS — U071 COVID-19: Secondary | ICD-10-CM | POA: Diagnosis not present

## 2020-03-10 DIAGNOSIS — J301 Allergic rhinitis due to pollen: Secondary | ICD-10-CM | POA: Diagnosis not present

## 2020-03-15 DIAGNOSIS — J301 Allergic rhinitis due to pollen: Secondary | ICD-10-CM | POA: Diagnosis not present

## 2020-03-15 DIAGNOSIS — M545 Low back pain, unspecified: Secondary | ICD-10-CM | POA: Diagnosis not present

## 2020-03-15 DIAGNOSIS — M47817 Spondylosis without myelopathy or radiculopathy, lumbosacral region: Secondary | ICD-10-CM | POA: Diagnosis not present

## 2020-03-17 DIAGNOSIS — J301 Allergic rhinitis due to pollen: Secondary | ICD-10-CM | POA: Diagnosis not present

## 2020-03-24 DIAGNOSIS — J301 Allergic rhinitis due to pollen: Secondary | ICD-10-CM | POA: Diagnosis not present

## 2020-03-30 DIAGNOSIS — M545 Low back pain, unspecified: Secondary | ICD-10-CM | POA: Diagnosis not present

## 2020-03-31 DIAGNOSIS — J301 Allergic rhinitis due to pollen: Secondary | ICD-10-CM | POA: Diagnosis not present

## 2020-04-07 DIAGNOSIS — J301 Allergic rhinitis due to pollen: Secondary | ICD-10-CM | POA: Diagnosis not present

## 2020-04-10 DIAGNOSIS — M47817 Spondylosis without myelopathy or radiculopathy, lumbosacral region: Secondary | ICD-10-CM | POA: Diagnosis not present

## 2020-04-10 DIAGNOSIS — M545 Low back pain, unspecified: Secondary | ICD-10-CM | POA: Diagnosis not present

## 2020-04-13 DIAGNOSIS — M545 Low back pain, unspecified: Secondary | ICD-10-CM | POA: Diagnosis not present

## 2020-04-14 DIAGNOSIS — J301 Allergic rhinitis due to pollen: Secondary | ICD-10-CM | POA: Diagnosis not present

## 2020-04-17 DIAGNOSIS — M545 Low back pain, unspecified: Secondary | ICD-10-CM | POA: Diagnosis not present

## 2020-04-19 DIAGNOSIS — E1142 Type 2 diabetes mellitus with diabetic polyneuropathy: Secondary | ICD-10-CM | POA: Diagnosis not present

## 2020-04-19 DIAGNOSIS — M79672 Pain in left foot: Secondary | ICD-10-CM | POA: Diagnosis not present

## 2020-04-19 DIAGNOSIS — Z794 Long term (current) use of insulin: Secondary | ICD-10-CM | POA: Diagnosis not present

## 2020-04-19 DIAGNOSIS — M79671 Pain in right foot: Secondary | ICD-10-CM | POA: Diagnosis not present

## 2020-04-19 DIAGNOSIS — B351 Tinea unguium: Secondary | ICD-10-CM | POA: Diagnosis not present

## 2020-04-19 DIAGNOSIS — M545 Low back pain, unspecified: Secondary | ICD-10-CM | POA: Diagnosis not present

## 2020-04-26 DIAGNOSIS — M545 Low back pain, unspecified: Secondary | ICD-10-CM | POA: Diagnosis not present

## 2020-04-28 DIAGNOSIS — J301 Allergic rhinitis due to pollen: Secondary | ICD-10-CM | POA: Diagnosis not present

## 2020-05-01 DIAGNOSIS — M545 Low back pain, unspecified: Secondary | ICD-10-CM | POA: Diagnosis not present

## 2020-05-03 DIAGNOSIS — M47817 Spondylosis without myelopathy or radiculopathy, lumbosacral region: Secondary | ICD-10-CM | POA: Diagnosis not present

## 2020-05-10 DIAGNOSIS — M545 Low back pain, unspecified: Secondary | ICD-10-CM | POA: Diagnosis not present

## 2020-05-12 DIAGNOSIS — J301 Allergic rhinitis due to pollen: Secondary | ICD-10-CM | POA: Diagnosis not present

## 2020-05-16 DIAGNOSIS — E1169 Type 2 diabetes mellitus with other specified complication: Secondary | ICD-10-CM | POA: Diagnosis not present

## 2020-05-16 DIAGNOSIS — E538 Deficiency of other specified B group vitamins: Secondary | ICD-10-CM | POA: Diagnosis not present

## 2020-05-16 DIAGNOSIS — E039 Hypothyroidism, unspecified: Secondary | ICD-10-CM | POA: Diagnosis not present

## 2020-05-16 DIAGNOSIS — E782 Mixed hyperlipidemia: Secondary | ICD-10-CM | POA: Diagnosis not present

## 2020-05-16 DIAGNOSIS — F418 Other specified anxiety disorders: Secondary | ICD-10-CM | POA: Diagnosis not present

## 2020-05-16 DIAGNOSIS — I700 Atherosclerosis of aorta: Secondary | ICD-10-CM | POA: Diagnosis not present

## 2020-05-16 DIAGNOSIS — K219 Gastro-esophageal reflux disease without esophagitis: Secondary | ICD-10-CM | POA: Diagnosis not present

## 2020-05-16 DIAGNOSIS — E669 Obesity, unspecified: Secondary | ICD-10-CM | POA: Diagnosis not present

## 2020-05-16 DIAGNOSIS — Z794 Long term (current) use of insulin: Secondary | ICD-10-CM | POA: Diagnosis not present

## 2020-05-16 DIAGNOSIS — Z79899 Other long term (current) drug therapy: Secondary | ICD-10-CM | POA: Diagnosis not present

## 2020-05-16 DIAGNOSIS — I10 Essential (primary) hypertension: Secondary | ICD-10-CM | POA: Diagnosis not present

## 2020-05-17 DIAGNOSIS — S0502XA Injury of conjunctiva and corneal abrasion without foreign body, left eye, initial encounter: Secondary | ICD-10-CM | POA: Diagnosis not present

## 2020-05-17 DIAGNOSIS — M545 Low back pain, unspecified: Secondary | ICD-10-CM | POA: Diagnosis not present

## 2020-05-19 DIAGNOSIS — J301 Allergic rhinitis due to pollen: Secondary | ICD-10-CM | POA: Diagnosis not present

## 2020-05-24 DIAGNOSIS — M545 Low back pain, unspecified: Secondary | ICD-10-CM | POA: Diagnosis not present

## 2020-05-26 DIAGNOSIS — J301 Allergic rhinitis due to pollen: Secondary | ICD-10-CM | POA: Diagnosis not present

## 2020-05-29 DIAGNOSIS — M545 Low back pain, unspecified: Secondary | ICD-10-CM | POA: Diagnosis not present

## 2020-05-29 DIAGNOSIS — M47817 Spondylosis without myelopathy or radiculopathy, lumbosacral region: Secondary | ICD-10-CM | POA: Diagnosis not present

## 2020-05-30 DIAGNOSIS — J449 Chronic obstructive pulmonary disease, unspecified: Secondary | ICD-10-CM | POA: Diagnosis not present

## 2020-05-31 DIAGNOSIS — M47817 Spondylosis without myelopathy or radiculopathy, lumbosacral region: Secondary | ICD-10-CM | POA: Diagnosis not present

## 2020-06-07 DIAGNOSIS — J301 Allergic rhinitis due to pollen: Secondary | ICD-10-CM | POA: Diagnosis not present

## 2020-06-07 DIAGNOSIS — M545 Low back pain, unspecified: Secondary | ICD-10-CM | POA: Diagnosis not present

## 2020-06-09 DIAGNOSIS — J301 Allergic rhinitis due to pollen: Secondary | ICD-10-CM | POA: Diagnosis not present

## 2020-06-14 DIAGNOSIS — M545 Low back pain, unspecified: Secondary | ICD-10-CM | POA: Diagnosis not present

## 2020-06-16 DIAGNOSIS — J301 Allergic rhinitis due to pollen: Secondary | ICD-10-CM | POA: Diagnosis not present

## 2020-06-20 DIAGNOSIS — M545 Low back pain, unspecified: Secondary | ICD-10-CM | POA: Diagnosis not present

## 2020-06-22 DIAGNOSIS — M79672 Pain in left foot: Secondary | ICD-10-CM | POA: Diagnosis not present

## 2020-06-22 DIAGNOSIS — M19072 Primary osteoarthritis, left ankle and foot: Secondary | ICD-10-CM | POA: Diagnosis not present

## 2020-06-23 DIAGNOSIS — J301 Allergic rhinitis due to pollen: Secondary | ICD-10-CM | POA: Diagnosis not present

## 2020-06-30 DIAGNOSIS — E039 Hypothyroidism, unspecified: Secondary | ICD-10-CM | POA: Diagnosis not present

## 2020-07-04 DIAGNOSIS — M10072 Idiopathic gout, left ankle and foot: Secondary | ICD-10-CM | POA: Diagnosis not present

## 2020-07-04 DIAGNOSIS — I10 Essential (primary) hypertension: Secondary | ICD-10-CM | POA: Diagnosis not present

## 2020-07-04 DIAGNOSIS — Z794 Long term (current) use of insulin: Secondary | ICD-10-CM | POA: Diagnosis not present

## 2020-07-04 DIAGNOSIS — Z79899 Other long term (current) drug therapy: Secondary | ICD-10-CM | POA: Diagnosis not present

## 2020-07-04 DIAGNOSIS — M19072 Primary osteoarthritis, left ankle and foot: Secondary | ICD-10-CM | POA: Diagnosis not present

## 2020-07-04 DIAGNOSIS — M2012 Hallux valgus (acquired), left foot: Secondary | ICD-10-CM | POA: Diagnosis not present

## 2020-07-04 DIAGNOSIS — E1122 Type 2 diabetes mellitus with diabetic chronic kidney disease: Secondary | ICD-10-CM | POA: Diagnosis not present

## 2020-07-04 DIAGNOSIS — M109 Gout, unspecified: Secondary | ICD-10-CM | POA: Diagnosis not present

## 2020-07-04 DIAGNOSIS — N1831 Chronic kidney disease, stage 3a: Secondary | ICD-10-CM | POA: Diagnosis not present

## 2020-07-04 DIAGNOSIS — M7732 Calcaneal spur, left foot: Secondary | ICD-10-CM | POA: Diagnosis not present

## 2020-07-05 DIAGNOSIS — M545 Low back pain, unspecified: Secondary | ICD-10-CM | POA: Diagnosis not present

## 2020-07-07 DIAGNOSIS — J301 Allergic rhinitis due to pollen: Secondary | ICD-10-CM | POA: Diagnosis not present

## 2020-07-12 DIAGNOSIS — M545 Low back pain, unspecified: Secondary | ICD-10-CM | POA: Diagnosis not present

## 2020-07-14 DIAGNOSIS — J301 Allergic rhinitis due to pollen: Secondary | ICD-10-CM | POA: Diagnosis not present

## 2020-07-18 DIAGNOSIS — M545 Low back pain, unspecified: Secondary | ICD-10-CM | POA: Diagnosis not present

## 2020-07-18 DIAGNOSIS — M47817 Spondylosis without myelopathy or radiculopathy, lumbosacral region: Secondary | ICD-10-CM | POA: Diagnosis not present

## 2020-07-19 DIAGNOSIS — M545 Low back pain, unspecified: Secondary | ICD-10-CM | POA: Diagnosis not present

## 2020-07-27 DIAGNOSIS — M545 Low back pain, unspecified: Secondary | ICD-10-CM | POA: Diagnosis not present

## 2020-07-28 DIAGNOSIS — J301 Allergic rhinitis due to pollen: Secondary | ICD-10-CM | POA: Diagnosis not present

## 2020-08-02 DIAGNOSIS — M47817 Spondylosis without myelopathy or radiculopathy, lumbosacral region: Secondary | ICD-10-CM | POA: Diagnosis not present

## 2020-08-02 DIAGNOSIS — E119 Type 2 diabetes mellitus without complications: Secondary | ICD-10-CM | POA: Diagnosis not present

## 2020-08-04 DIAGNOSIS — J301 Allergic rhinitis due to pollen: Secondary | ICD-10-CM | POA: Diagnosis not present

## 2020-08-09 DIAGNOSIS — M545 Low back pain, unspecified: Secondary | ICD-10-CM | POA: Diagnosis not present

## 2020-08-10 DIAGNOSIS — J301 Allergic rhinitis due to pollen: Secondary | ICD-10-CM | POA: Diagnosis not present

## 2020-08-15 DIAGNOSIS — M47817 Spondylosis without myelopathy or radiculopathy, lumbosacral region: Secondary | ICD-10-CM | POA: Diagnosis not present

## 2020-08-15 DIAGNOSIS — M545 Low back pain, unspecified: Secondary | ICD-10-CM | POA: Diagnosis not present

## 2020-08-15 DIAGNOSIS — M542 Cervicalgia: Secondary | ICD-10-CM | POA: Diagnosis not present

## 2020-08-16 DIAGNOSIS — M545 Low back pain, unspecified: Secondary | ICD-10-CM | POA: Diagnosis not present

## 2020-08-29 DIAGNOSIS — H1045 Other chronic allergic conjunctivitis: Secondary | ICD-10-CM | POA: Diagnosis not present

## 2020-08-29 DIAGNOSIS — M542 Cervicalgia: Secondary | ICD-10-CM | POA: Diagnosis not present

## 2020-08-30 DIAGNOSIS — J301 Allergic rhinitis due to pollen: Secondary | ICD-10-CM | POA: Diagnosis not present

## 2020-09-01 DIAGNOSIS — J301 Allergic rhinitis due to pollen: Secondary | ICD-10-CM | POA: Diagnosis not present

## 2020-09-06 DIAGNOSIS — M47812 Spondylosis without myelopathy or radiculopathy, cervical region: Secondary | ICD-10-CM | POA: Diagnosis not present

## 2020-09-06 DIAGNOSIS — E119 Type 2 diabetes mellitus without complications: Secondary | ICD-10-CM | POA: Diagnosis not present

## 2020-09-06 DIAGNOSIS — M545 Low back pain, unspecified: Secondary | ICD-10-CM | POA: Diagnosis not present

## 2020-09-07 DIAGNOSIS — H16142 Punctate keratitis, left eye: Secondary | ICD-10-CM | POA: Diagnosis not present

## 2020-09-07 DIAGNOSIS — H1033 Unspecified acute conjunctivitis, bilateral: Secondary | ICD-10-CM | POA: Diagnosis not present

## 2020-09-08 DIAGNOSIS — J301 Allergic rhinitis due to pollen: Secondary | ICD-10-CM | POA: Diagnosis not present

## 2020-09-13 DIAGNOSIS — M545 Low back pain, unspecified: Secondary | ICD-10-CM | POA: Diagnosis not present

## 2020-09-15 DIAGNOSIS — J301 Allergic rhinitis due to pollen: Secondary | ICD-10-CM | POA: Diagnosis not present

## 2020-09-18 DIAGNOSIS — M47812 Spondylosis without myelopathy or radiculopathy, cervical region: Secondary | ICD-10-CM | POA: Diagnosis not present

## 2020-09-18 DIAGNOSIS — M542 Cervicalgia: Secondary | ICD-10-CM | POA: Diagnosis not present

## 2020-09-19 DIAGNOSIS — J301 Allergic rhinitis due to pollen: Secondary | ICD-10-CM | POA: Diagnosis not present

## 2020-09-19 DIAGNOSIS — H903 Sensorineural hearing loss, bilateral: Secondary | ICD-10-CM | POA: Diagnosis not present

## 2020-09-20 DIAGNOSIS — M545 Low back pain, unspecified: Secondary | ICD-10-CM | POA: Diagnosis not present

## 2020-09-29 DIAGNOSIS — J301 Allergic rhinitis due to pollen: Secondary | ICD-10-CM | POA: Diagnosis not present

## 2020-10-04 DIAGNOSIS — M545 Low back pain, unspecified: Secondary | ICD-10-CM | POA: Diagnosis not present

## 2020-10-04 DIAGNOSIS — M47812 Spondylosis without myelopathy or radiculopathy, cervical region: Secondary | ICD-10-CM | POA: Diagnosis not present

## 2020-10-05 DIAGNOSIS — H40013 Open angle with borderline findings, low risk, bilateral: Secondary | ICD-10-CM | POA: Diagnosis not present

## 2020-10-06 DIAGNOSIS — J301 Allergic rhinitis due to pollen: Secondary | ICD-10-CM | POA: Diagnosis not present

## 2020-10-11 DIAGNOSIS — M545 Low back pain, unspecified: Secondary | ICD-10-CM | POA: Diagnosis not present

## 2020-10-13 DIAGNOSIS — J301 Allergic rhinitis due to pollen: Secondary | ICD-10-CM | POA: Diagnosis not present

## 2020-10-17 DIAGNOSIS — Z23 Encounter for immunization: Secondary | ICD-10-CM | POA: Diagnosis not present

## 2020-10-17 DIAGNOSIS — B078 Other viral warts: Secondary | ICD-10-CM | POA: Diagnosis not present

## 2020-10-17 DIAGNOSIS — L918 Other hypertrophic disorders of the skin: Secondary | ICD-10-CM | POA: Diagnosis not present

## 2020-10-18 DIAGNOSIS — M545 Low back pain, unspecified: Secondary | ICD-10-CM | POA: Diagnosis not present

## 2020-10-20 DIAGNOSIS — J301 Allergic rhinitis due to pollen: Secondary | ICD-10-CM | POA: Diagnosis not present

## 2020-10-25 DIAGNOSIS — M545 Low back pain, unspecified: Secondary | ICD-10-CM | POA: Diagnosis not present

## 2020-10-30 DIAGNOSIS — Z794 Long term (current) use of insulin: Secondary | ICD-10-CM | POA: Diagnosis not present

## 2020-10-30 DIAGNOSIS — B351 Tinea unguium: Secondary | ICD-10-CM | POA: Diagnosis not present

## 2020-10-30 DIAGNOSIS — E1142 Type 2 diabetes mellitus with diabetic polyneuropathy: Secondary | ICD-10-CM | POA: Diagnosis not present

## 2020-11-02 DIAGNOSIS — M545 Low back pain, unspecified: Secondary | ICD-10-CM | POA: Diagnosis not present

## 2020-11-03 DIAGNOSIS — J301 Allergic rhinitis due to pollen: Secondary | ICD-10-CM | POA: Diagnosis not present

## 2020-11-06 DIAGNOSIS — M542 Cervicalgia: Secondary | ICD-10-CM | POA: Diagnosis not present

## 2020-11-06 DIAGNOSIS — M47812 Spondylosis without myelopathy or radiculopathy, cervical region: Secondary | ICD-10-CM | POA: Diagnosis not present

## 2020-11-06 DIAGNOSIS — M791 Myalgia, unspecified site: Secondary | ICD-10-CM | POA: Diagnosis not present

## 2020-11-08 DIAGNOSIS — M545 Low back pain, unspecified: Secondary | ICD-10-CM | POA: Diagnosis not present

## 2020-11-10 DIAGNOSIS — J301 Allergic rhinitis due to pollen: Secondary | ICD-10-CM | POA: Diagnosis not present

## 2020-11-15 DIAGNOSIS — M545 Low back pain, unspecified: Secondary | ICD-10-CM | POA: Diagnosis not present

## 2020-11-17 DIAGNOSIS — J301 Allergic rhinitis due to pollen: Secondary | ICD-10-CM | POA: Diagnosis not present

## 2020-11-22 DIAGNOSIS — M545 Low back pain, unspecified: Secondary | ICD-10-CM | POA: Diagnosis not present

## 2020-11-22 DIAGNOSIS — J301 Allergic rhinitis due to pollen: Secondary | ICD-10-CM | POA: Diagnosis not present

## 2020-11-23 DIAGNOSIS — E039 Hypothyroidism, unspecified: Secondary | ICD-10-CM | POA: Diagnosis not present

## 2020-11-23 DIAGNOSIS — F418 Other specified anxiety disorders: Secondary | ICD-10-CM | POA: Diagnosis not present

## 2020-11-23 DIAGNOSIS — E538 Deficiency of other specified B group vitamins: Secondary | ICD-10-CM | POA: Diagnosis not present

## 2020-11-23 DIAGNOSIS — M1A0720 Idiopathic chronic gout, left ankle and foot, without tophus (tophi): Secondary | ICD-10-CM | POA: Diagnosis not present

## 2020-11-23 DIAGNOSIS — K219 Gastro-esophageal reflux disease without esophagitis: Secondary | ICD-10-CM | POA: Diagnosis not present

## 2020-11-23 DIAGNOSIS — I10 Essential (primary) hypertension: Secondary | ICD-10-CM | POA: Diagnosis not present

## 2020-11-23 DIAGNOSIS — Z79899 Other long term (current) drug therapy: Secondary | ICD-10-CM | POA: Diagnosis not present

## 2020-11-23 DIAGNOSIS — Z794 Long term (current) use of insulin: Secondary | ICD-10-CM | POA: Diagnosis not present

## 2020-11-23 DIAGNOSIS — J449 Chronic obstructive pulmonary disease, unspecified: Secondary | ICD-10-CM | POA: Diagnosis not present

## 2020-11-23 DIAGNOSIS — E782 Mixed hyperlipidemia: Secondary | ICD-10-CM | POA: Diagnosis not present

## 2020-11-23 DIAGNOSIS — I700 Atherosclerosis of aorta: Secondary | ICD-10-CM | POA: Diagnosis not present

## 2020-11-23 DIAGNOSIS — Z0000 Encounter for general adult medical examination without abnormal findings: Secondary | ICD-10-CM | POA: Diagnosis not present

## 2020-11-23 DIAGNOSIS — N1831 Chronic kidney disease, stage 3a: Secondary | ICD-10-CM | POA: Diagnosis not present

## 2020-11-23 DIAGNOSIS — E1122 Type 2 diabetes mellitus with diabetic chronic kidney disease: Secondary | ICD-10-CM | POA: Diagnosis not present

## 2020-11-24 DIAGNOSIS — J301 Allergic rhinitis due to pollen: Secondary | ICD-10-CM | POA: Diagnosis not present

## 2020-11-29 DIAGNOSIS — M545 Low back pain, unspecified: Secondary | ICD-10-CM | POA: Diagnosis not present

## 2020-11-30 DIAGNOSIS — J449 Chronic obstructive pulmonary disease, unspecified: Secondary | ICD-10-CM | POA: Diagnosis not present

## 2020-12-01 DIAGNOSIS — J301 Allergic rhinitis due to pollen: Secondary | ICD-10-CM | POA: Diagnosis not present

## 2020-12-08 DIAGNOSIS — J301 Allergic rhinitis due to pollen: Secondary | ICD-10-CM | POA: Diagnosis not present

## 2020-12-11 ENCOUNTER — Ambulatory Visit: Payer: HMO | Admitting: *Deleted

## 2020-12-12 ENCOUNTER — Encounter: Payer: HMO | Attending: Nurse Practitioner | Admitting: *Deleted

## 2020-12-12 ENCOUNTER — Encounter: Payer: Self-pay | Admitting: *Deleted

## 2020-12-12 ENCOUNTER — Other Ambulatory Visit: Payer: Self-pay

## 2020-12-12 VITALS — BP 140/78 | Ht 70.0 in | Wt 262.6 lb

## 2020-12-12 DIAGNOSIS — Z6838 Body mass index (BMI) 38.0-38.9, adult: Secondary | ICD-10-CM | POA: Diagnosis not present

## 2020-12-12 DIAGNOSIS — I129 Hypertensive chronic kidney disease with stage 1 through stage 4 chronic kidney disease, or unspecified chronic kidney disease: Secondary | ICD-10-CM | POA: Diagnosis not present

## 2020-12-12 DIAGNOSIS — N1831 Chronic kidney disease, stage 3a: Secondary | ICD-10-CM | POA: Diagnosis not present

## 2020-12-12 DIAGNOSIS — E1122 Type 2 diabetes mellitus with diabetic chronic kidney disease: Secondary | ICD-10-CM | POA: Diagnosis not present

## 2020-12-12 DIAGNOSIS — E1165 Type 2 diabetes mellitus with hyperglycemia: Secondary | ICD-10-CM

## 2020-12-12 DIAGNOSIS — Z794 Long term (current) use of insulin: Secondary | ICD-10-CM | POA: Diagnosis not present

## 2020-12-12 NOTE — Patient Instructions (Addendum)
Check blood sugars 4 x day before each meal and before bed every day or as needed with [employer] CGM Bring blood sugar records to the next appointment  Exercise:  Continue walking  for    15  minutes   5  days a week and increase as tolerated  Eat 3 meals day,   1-2  snacks a day Space meals 4-6 hours apart Allow 2-3 hours between meals and snacks Eat a serving of protein when having fruit for a snack  Complete 3 Day Food Record and bring to next appt  Carry fast acting glucose and a snack at all times Rotate injection sites  Return for appointment on:   Thursday February 15, 2021 at 11:00 am with [facility] (dietitian)

## 2020-12-12 NOTE — Progress Notes (Signed)
Diabetes Self-Management Education  Visit Type: First/Initial  Appt. Start Time: 1055 Appt. End Time: 1215  12/12/2020  [name], identified by name and date of birth, is a 70 y.o. male with a diagnosis of Diabetes: Type 2.   ASSESSMENT  Blood pressure 140/78, height 5\' 10"  (1.778 m), weight 262 lb 9.6 oz (119.1 kg). Body mass index is 37.68 kg/m.   Diabetes Self-Management Education - 12/12/20 1543       Visit Information   Visit Type First/Initial      Initial Visit   Diabetes Type Type 2    Are you currently following a meal plan? No    Are you taking your medications as prescribed? Yes    Date Diagnosed 30 years      Health Coping   How would you rate your overall health? Fair      Psychosocial Assessment   Patient Belief/Attitude about Diabetes Motivated to manage diabetes   "you deal with it"   Self-care barriers None    Self-management support Doctor's office;Family    Patient Concerns Glycemic Control;Weight Control;Nutrition/Meal planning;Medication;Monitoring;Healthy Lifestyle    Special Needs None    Preferred Learning Style Auditory;Visual;Hands on    Learning Readiness Ready    How often do you need to have someone help you when you read instructions, pamphlets, or other written materials from your doctor or pharmacy? 1 - Never    What is the last grade level you completed in school? high school      Pre-Education Assessment   Patient understands the diabetes disease and treatment process. Needs Review    Patient understands incorporating nutritional management into lifestyle. Needs Instruction    Patient undertands incorporating physical activity into lifestyle. Needs Review    Patient understands using medications safely. Needs Review    Patient understands monitoring blood glucose, interpreting and using results Needs Review    Patient understands prevention, detection, and treatment of acute complications. Needs Review    Patient understands  prevention, detection, and treatment of chronic complications. Needs Review    Patient understands how to develop strategies to address psychosocial issues. Needs Review    Patient understands how to develop strategies to promote health/change behavior. Needs Review      Complications   Last HgB A1C per patient/outside source 8.8 %   11/23/2020   How often do you check your blood sugar? > 4 times/day   Pt has FreeStyle Libre. he didn't bring his reader to appointment and doesn't have app on his phone. He reports reading of 132 mg/dL earlier today. He doesn't know his time in target either.   Have you had a dilated eye exam in the past 12 months? Yes    Have you had a dental exam in the past 12 months? Yes    Are you checking your feet? Yes    How many days per week are you checking your feet? 7      Dietary Intake   Breakfast carnation instant breakfast with milk; eggs, bacon and toast; ham biscuit    Snack (morning) reports snacking "all day" - nuts, raisins, watermelon, cantaloupe, protein bar, Ritz crackers, cheese crackers    Lunch ham sandwich; salad with chicken or salmon; vienna sausage or potted meat on crackers    Dinner chicken pie, steak, fish, pork chops; potatoes, peas, corn, beans, kale, broccoli, cauliflower, rice, pasta; lettuce, tomatoes, onions, peppers, carrots, cuccumbers    Beverage(s) water, coffee      Exercise  Exercise Type Light (walking / raking leaves)    How many days per week to you exercise? 5    How many minutes per day do you exercise? 15    Total minutes per week of exercise 75      Patient Education   Previous Diabetes Education Yes (please comment)   25 years ago - here   Disease state  Explored patient's options for treatment of their diabetes    Nutrition management  Role of diet in the treatment of diabetes and the relationship between the three main macronutrients and blood glucose level;Food label reading, portion sizes and measuring  food.;Carbohydrate counting;Reviewed blood glucose goals for pre and post meals and how to evaluate the patients' food intake on their blood glucose level.;Meal timing in regards to the patients' current diabetes medication.    Physical activity and exercise  Role of exercise on diabetes management, blood pressure control and cardiac health.    Medications Taught/reviewed insulin injection, site rotation, insulin storage and needle disposal.;Reviewed patients medication for diabetes, action, purpose, timing of dose and side effects. Split 124 units of insulin and don't give all in one area.    Monitoring Identified appropriate SMBG and/or A1C goals.;Other (comment)   FreeStyle Libre CGM time in target   Acute complications Taught treatment of hypoglycemia - the 15 rule.;Discussed and identified patients' treatment of hyperglycemia.    Chronic complications Relationship between chronic complications and blood glucose control    Psychosocial adjustment Identified and addressed patients feelings and concerns about diabetes      Individualized Goals (developed by patient)   Reducing Risk Other (comment)   improve blood sugars, decrease medications, prevent diabetes complications, lose weight, lead a healthier lifestyle, become more fit     Outcomes   Expected Outcomes Demonstrated interest in learning. Expect positive outcomes    Future DMSE 2 months        Individualized Plan for Diabetes Self-Management Training:   Learning Objective:  Patient will have a greater understanding of diabetes self-management. Patient education plan is to attend individual and/or group sessions per assessed needs and concerns.   Plan:   Patient Instructions  Check blood sugars 4 x day before each meal and before bed every day or as needed with FreeStyle Libre CGM Bring blood sugar records to the next appointment  Exercise:  Continue walking  for    15  minutes   5  days a week and increase as tolerated  Eat 3  meals day,   1-2  snacks a day Space meals 4-6 hours apart Allow 2-3 hours between meals and snacks Eat a serving of protein when having fruit for a snack  Complete 3 Day Food Record and bring to next appt  Carry fast acting glucose and a snack at all times Rotate injection sites  Return for appointment on:   Thursday February 15, 2021 at 11:00 am with [facility] (dietitian)  Expected Outcomes:  Demonstrated interest in learning. Expect positive outcomes  Education material provided:  General Meal Planning Guidelines Simple Meal Plan 3 Day Food Record Symptoms, causes and treatments of Hypoglycemia Symptoms, causes and treatments of Hyperglycemia   If problems or questions, patient to contact team via:   Johny Drilling, RN, [place] [phone]  Future DSME appointment: 2 months February 15, 2021 with the dietitian

## 2020-12-18 DIAGNOSIS — M545 Low back pain, unspecified: Secondary | ICD-10-CM | POA: Diagnosis not present

## 2020-12-18 DIAGNOSIS — M791 Myalgia, unspecified site: Secondary | ICD-10-CM | POA: Diagnosis not present

## 2020-12-18 DIAGNOSIS — M47812 Spondylosis without myelopathy or radiculopathy, cervical region: Secondary | ICD-10-CM | POA: Diagnosis not present

## 2020-12-18 DIAGNOSIS — M542 Cervicalgia: Secondary | ICD-10-CM | POA: Diagnosis not present

## 2020-12-18 DIAGNOSIS — F418 Other specified anxiety disorders: Secondary | ICD-10-CM | POA: Diagnosis not present

## 2020-12-20 DIAGNOSIS — M545 Low back pain, unspecified: Secondary | ICD-10-CM | POA: Diagnosis not present

## 2020-12-27 DIAGNOSIS — M545 Low back pain, unspecified: Secondary | ICD-10-CM | POA: Diagnosis not present

## 2020-12-29 DIAGNOSIS — J301 Allergic rhinitis due to pollen: Secondary | ICD-10-CM | POA: Diagnosis not present

## 2021-01-04 DIAGNOSIS — H40013 Open angle with borderline findings, low risk, bilateral: Secondary | ICD-10-CM | POA: Diagnosis not present

## 2021-01-05 DIAGNOSIS — J301 Allergic rhinitis due to pollen: Secondary | ICD-10-CM | POA: Diagnosis not present

## 2021-01-10 DIAGNOSIS — E119 Type 2 diabetes mellitus without complications: Secondary | ICD-10-CM | POA: Diagnosis not present

## 2021-01-10 DIAGNOSIS — M47812 Spondylosis without myelopathy or radiculopathy, cervical region: Secondary | ICD-10-CM | POA: Diagnosis not present

## 2021-01-12 DIAGNOSIS — J301 Allergic rhinitis due to pollen: Secondary | ICD-10-CM | POA: Diagnosis not present

## 2021-01-17 DIAGNOSIS — K219 Gastro-esophageal reflux disease without esophagitis: Secondary | ICD-10-CM | POA: Diagnosis not present

## 2021-01-17 DIAGNOSIS — Z1211 Encounter for screening for malignant neoplasm of colon: Secondary | ICD-10-CM | POA: Diagnosis not present

## 2021-01-17 DIAGNOSIS — Z8619 Personal history of other infectious and parasitic diseases: Secondary | ICD-10-CM | POA: Diagnosis not present

## 2021-01-23 DIAGNOSIS — J301 Allergic rhinitis due to pollen: Secondary | ICD-10-CM | POA: Diagnosis not present

## 2021-01-30 DIAGNOSIS — J301 Allergic rhinitis due to pollen: Secondary | ICD-10-CM | POA: Diagnosis not present

## 2021-01-31 ENCOUNTER — Other Ambulatory Visit: Payer: Self-pay

## 2021-01-31 ENCOUNTER — Other Ambulatory Visit: Payer: HMO

## 2021-01-31 DIAGNOSIS — N401 Enlarged prostate with lower urinary tract symptoms: Secondary | ICD-10-CM | POA: Diagnosis not present

## 2021-02-01 LAB — PSA: Prostate Specific Ag, Serum: 2.3 ng/mL (ref 0.0–4.0)

## 2021-02-02 ENCOUNTER — Ambulatory Visit: Payer: HMO | Admitting: Urology

## 2021-02-02 ENCOUNTER — Encounter: Payer: Self-pay | Admitting: Urology

## 2021-02-02 ENCOUNTER — Other Ambulatory Visit: Payer: Self-pay

## 2021-02-02 ENCOUNTER — Telehealth: Payer: Self-pay | Admitting: *Deleted

## 2021-02-02 VITALS — BP 147/78 | HR 100 | Ht 70.0 in | Wt 250.0 lb

## 2021-02-02 DIAGNOSIS — N475 Adhesions of prepuce and glans penis: Secondary | ICD-10-CM | POA: Diagnosis not present

## 2021-02-02 DIAGNOSIS — N281 Cyst of kidney, acquired: Secondary | ICD-10-CM | POA: Diagnosis not present

## 2021-02-02 DIAGNOSIS — N5201 Erectile dysfunction due to arterial insufficiency: Secondary | ICD-10-CM

## 2021-02-02 DIAGNOSIS — N401 Enlarged prostate with lower urinary tract symptoms: Secondary | ICD-10-CM | POA: Diagnosis not present

## 2021-02-02 LAB — URINALYSIS, COMPLETE
Bilirubin, UA: NEGATIVE
Glucose, UA: NEGATIVE
Ketones, UA: NEGATIVE
Leukocytes,UA: NEGATIVE
Nitrite, UA: NEGATIVE
Protein,UA: NEGATIVE
RBC, UA: NEGATIVE
Specific Gravity, UA: 1.015 (ref 1.005–1.030)
Urobilinogen, Ur: 0.2 mg/dL (ref 0.2–1.0)
pH, UA: 7 (ref 5.0–7.5)

## 2021-02-02 LAB — MICROSCOPIC EXAMINATION
Bacteria, UA: NONE SEEN
Epithelial Cells (non renal): NONE SEEN /hpf (ref 0–10)
WBC, UA: NONE SEEN /hpf (ref 0–5)

## 2021-02-02 LAB — BLADDER SCAN AMB NON-IMAGING: Scan Result: 25

## 2021-02-02 MED ORDER — TAMSULOSIN HCL 0.4 MG PO CAPS: 0.4000 mg | ORAL_CAPSULE | Freq: Every day | ORAL | 3 refills | Status: DC

## 2021-02-02 MED ORDER — NYSTATIN-TRIAMCINOLONE 100000-0.1 UNIT/GM-% EX CREA: 1.0000 "application " | TOPICAL_CREAM | Freq: Two times a day (BID) | CUTANEOUS | 0 refills | Status: AC

## 2021-02-02 MED ORDER — SILDENAFIL CITRATE 100 MG PO TABS: ORAL_TABLET | ORAL | 0 refills | Status: DC

## 2021-02-02 NOTE — Progress Notes (Signed)
02/02/2021 10:18 AM   Nicholas Humphrey 12-25-1950 [id number]  Referring provider: Sallee Lange, NP [address] [place],  [place] [address]  Chief Complaint  Patient presents with   Elevated PSA    Urologic history: 1.  BPH with lower urinary tract symptoms -Episode urinary retention after back surgery 2019 which resolved -Tamsulosin   HPI: 71 y.o. male presents for annual follow-up.  Since last visit he states he had a spinal MRI which showed a renal cyst Stable voiding symptoms on tamsulosin though has noted some increased postvoid dribbling Also wanted to discuss ED; difficulty achieving and maintaining an erection.  Mild pain with erection but no curvature.  Organic risk factors diabetes, hyperlipidemia, hypertension, antihypertensive medications.  Denies oral/sublingual nitrate use Also has some foreskin irritation that he wanted checked Earlier this week noted a scant amount of blood in the superior aspect of his underwear in the front Denies dysuria, gross hematuria Denies flank, abdominal or pelvic pain PSA 01/31/2021 2.3  PMH: Past Medical History:  Diagnosis Date   Diabetes mellitus without complication ([place])    Diabetic neuropathy ([place])    Fall from ladder 2018   LANDED ON CONCRETE   GERD (gastroesophageal reflux disease)    Hypercholesteremia    Hypertension    Hypothyroidism    PONV (postoperative nausea and vomiting)    Wears contact lenses     Surgical History: Past Surgical History:  Procedure Laterality Date   APPENDECTOMY     BACK SURGERY  2019   CARPAL TUNNEL RELEASE Right 08/18/2019   Procedure: ENDOSCOPIC RIGHT CARPAL TUNNEL RELEASE , RELEASE RIGHT LONG TRIGGER FINGER. INDEX TRIGGER AND TRIGGER THUMB RELEASE;  Surgeon: Corky Mull, MD;  Location: [facility] ORS;  Service: Orthopedics;  Laterality: Right;   ELBOW SURGERY Left 2018   X2   FRACTURE SURGERY Left    SEVERAL BROKEN BONES AFTER FALL   HAND SURGERY Left 2018   HARDWARE  REMOVED   HAND SURGERY Left 2018   HARDWARE AFTER FALL   INGUINAL HERNIA REPAIR Right    KNEE ARTHROSCOPY W/ PARTIAL MEDIAL MENISCECTOMY Left 08/17/2010   LUMBAR LAMINECTOMY     ROTATOR CUFF REPAIR Right 2013   TRIGGER FINGER RELEASE Right 10/04/2015   Procedure: RELEASE TRIGGER FINGER/A-1 PULLEY;  Surgeon: Corky Mull, MD;  Location: [facility];  Service: Orthopedics;  Laterality: Right;  PREFER BIER BLOCK Diabetic - insulin and oral meds   TYMPANOPLASTY Right     Home Medications:  Allergies as of 02/02/2021       Reactions   Pregabalin Swelling   Legs and ankles   Oxycodone Nausea And Vomiting   Other reaction(s): Hallucinations, Vomiting   Crestor [rosuvastatin]    Muscle pain   Doxycycline Nausea Only   Lipitor [atorvastatin]    Muscle pain   Tramadol Anxiety   Other reaction(s): Hallucinations        Medication List        Accurate as of February 02, 2021 10:18 AM. If you have any questions, ask your nurse or doctor.          allopurinol 100 MG tablet Commonly known as: ZYLOPRIM Take 100 mg by mouth daily.   aspirin 81 MG tablet Take 81 mg by mouth daily.   colchicine 0.6 MG tablet   ELDERBERRY PO Take 1 capsule by mouth daily.   EPINEPHrine 0.3 mg/0.3 mL Soaj injection Commonly known as: EPI-PEN Inject into the muscle.   fluticasone-salmeterol 250-50 MCG/ACT  Aepb Commonly known as: ADVAIR Inhale 1 puff into the lungs 2 (two) times daily.   FreeStyle Libre 2 Sensor Misc   gabapentin 300 MG capsule Commonly known as: NEURONTIN Take 1 capsule by mouth 3 (three) times daily.   insulin aspart 100 UNIT/ML FlexPen Commonly known as: NOVOLOG Inject 16 units before breakfast, 27 units before lunch and 45 units before dinner as directed.   insulin degludec 200 UNIT/ML FlexTouch Pen Commonly known as: TRESIBA Inject 124 Units into the skin daily.   levothyroxine 137 MCG tablet Commonly known as: SYNTHROID Take 137 mcg by mouth daily.    loratadine 10 MG tablet Commonly known as: CLARITIN Take 10 mg by mouth daily.   losartan-hydrochlorothiazide 50-12.5 MG tablet Commonly known as: HYZAAR Take 1 tablet by mouth daily.   magnesium oxide 400 MG tablet Commonly known as: MAG-OX Take 400 mg by mouth daily.   metFORMIN 1000 MG tablet Commonly known as: GLUCOPHAGE Take 1,000-1,500 mg by mouth See admin instructions. 1000 mg in AM. 1500 mg in PM.   methocarbamol 500 MG tablet Commonly known as: ROBAXIN Take 500 mg by mouth 2 (two) times daily.   montelukast 10 MG tablet Commonly known as: SINGULAIR Take 10 mg by mouth at bedtime.   multivitamin tablet Take 1 tablet by mouth daily.   naproxen 500 MG tablet Commonly known as: NAPROSYN Take 500 mg by mouth 2 (two) times daily.   pantoprazole 40 MG tablet Commonly known as: PROTONIX Take 40 mg by mouth 2 (two) times daily.   PARoxetine 20 MG tablet Commonly known as: PAXIL Take 20 mg by mouth daily.   Semaglutide(0.25 or 0.5MG /DOS) 2 MG/1.5ML Sopn Inject into the skin.   simvastatin 40 MG tablet Commonly known as: ZOCOR Take 40 mg by mouth at bedtime.   tamsulosin 0.4 MG Caps capsule Commonly known as: FLOMAX Take 1 capsule (0.4 mg total) by mouth daily.   vitamin B-12 500 MCG tablet Commonly known as: CYANOCOBALAMIN Take 500 mcg by mouth daily.        Allergies:  Allergies  Allergen Reactions   Pregabalin Swelling    Legs and ankles   Oxycodone Nausea And Vomiting    Other reaction(s): Hallucinations, Vomiting   Crestor [Rosuvastatin]     Muscle pain   Doxycycline Nausea Only   Lipitor [Atorvastatin]     Muscle pain   Tramadol Anxiety    Other reaction(s): Hallucinations    Family History: Family History  Problem Relation Age of Onset   Prostate cancer Father    Bladder Cancer Neg Hx    Kidney cancer Neg Hx     Social History:  reports that he has never smoked. He has never used smokeless tobacco. He reports that he does not  drink alcohol and does not use drugs.   Physical Exam: BP (!) 147/78    Pulse 100    Ht 5\' 10"  (1.778 m)    Wt 250 lb (113.4 kg)    BMI 35.87 kg/m   Constitutional:  Alert and oriented, No acute distress. HEENT: [place] AT, moist mucus membranes.  Trachea midline, no masses. Cardiovascular: No clubbing, cyanosis, or edema. Respiratory: Normal respiratory effort, no increased work of breathing. GI: Abdomen is soft, nontender, nondistended, no abdominal masses GU: Phallus-retractable foreskin though thin dorsal adhesion some adhesions and erythema at corona.  Meatus normal.  Testes descended bilaterally.  Prostate 40 g, smooth without nodules Skin: No rashes, bruises or suspicious lesions. Neurologic: Grossly intact, no focal deficits,  moving all 4 extremities. Psychiatric: Normal mood and affect.   Assessment & Plan:    1.  BPH with LUTS Stable on tamsulosin; refill sent Bladder scan PVR today 25 mL Noted a small amount of blood in the superior aspect of his underwear.  UA was ordered and if negative for blood instructed him to call should he note increased spotting  2.  Renal cyst MRI report not in epic and request a copy from [facility].  3.  Erectile dysfunction New problem Was interested in a PDE 5 inhibitor trial and Rx sildenafil sent to pharmacy.  Side effects were discussed  4.  Penile adhesions Trial triamcinolone cream Call back for persistent adhesions    Abbie Sons, MD  [facility] [address] [place], [place] [address] [phone]

## 2021-02-02 NOTE — Telephone Encounter (Signed)
Left message for patient to come by the office to sign a release form so we can get a copy of his mri done at  [facility]

## 2021-02-05 ENCOUNTER — Other Ambulatory Visit: Payer: Self-pay | Admitting: *Deleted

## 2021-02-05 DIAGNOSIS — M542 Cervicalgia: Secondary | ICD-10-CM | POA: Diagnosis not present

## 2021-02-05 DIAGNOSIS — M545 Low back pain, unspecified: Secondary | ICD-10-CM | POA: Diagnosis not present

## 2021-02-05 DIAGNOSIS — M791 Myalgia, unspecified site: Secondary | ICD-10-CM | POA: Diagnosis not present

## 2021-02-05 DIAGNOSIS — M47812 Spondylosis without myelopathy or radiculopathy, cervical region: Secondary | ICD-10-CM | POA: Diagnosis not present

## 2021-02-05 MED ORDER — SILDENAFIL CITRATE 100 MG PO TABS: ORAL_TABLET | ORAL | 0 refills | Status: DC

## 2021-02-12 DIAGNOSIS — E1122 Type 2 diabetes mellitus with diabetic chronic kidney disease: Secondary | ICD-10-CM | POA: Diagnosis not present

## 2021-02-12 DIAGNOSIS — I10 Essential (primary) hypertension: Secondary | ICD-10-CM | POA: Diagnosis not present

## 2021-02-12 DIAGNOSIS — N1831 Chronic kidney disease, stage 3a: Secondary | ICD-10-CM | POA: Diagnosis not present

## 2021-02-12 DIAGNOSIS — B349 Viral infection, unspecified: Secondary | ICD-10-CM | POA: Diagnosis not present

## 2021-02-12 DIAGNOSIS — Z794 Long term (current) use of insulin: Secondary | ICD-10-CM | POA: Diagnosis not present

## 2021-02-12 DIAGNOSIS — J0100 Acute maxillary sinusitis, unspecified: Secondary | ICD-10-CM | POA: Diagnosis not present

## 2021-02-14 DIAGNOSIS — J301 Allergic rhinitis due to pollen: Secondary | ICD-10-CM | POA: Diagnosis not present

## 2021-02-15 ENCOUNTER — Ambulatory Visit: Payer: HMO | Admitting: Dietician

## 2021-02-19 ENCOUNTER — Telehealth: Payer: Self-pay | Admitting: Dietician

## 2021-02-19 NOTE — Telephone Encounter (Signed)
Returned message from patient to reschedule his cancelled appointment from 02/15/21. Rescheduled for Thursday 03/01/21 at 11:00am.

## 2021-02-23 DIAGNOSIS — J301 Allergic rhinitis due to pollen: Secondary | ICD-10-CM | POA: Diagnosis not present

## 2021-02-27 DIAGNOSIS — M9905 Segmental and somatic dysfunction of pelvic region: Secondary | ICD-10-CM | POA: Diagnosis not present

## 2021-02-27 DIAGNOSIS — M9904 Segmental and somatic dysfunction of sacral region: Secondary | ICD-10-CM | POA: Diagnosis not present

## 2021-02-27 DIAGNOSIS — M9901 Segmental and somatic dysfunction of cervical region: Secondary | ICD-10-CM | POA: Diagnosis not present

## 2021-02-27 DIAGNOSIS — M9903 Segmental and somatic dysfunction of lumbar region: Secondary | ICD-10-CM | POA: Diagnosis not present

## 2021-03-01 ENCOUNTER — Encounter: Payer: HMO | Attending: Nurse Practitioner | Admitting: Dietician

## 2021-03-01 ENCOUNTER — Other Ambulatory Visit: Payer: Self-pay

## 2021-03-01 ENCOUNTER — Telehealth: Payer: Self-pay | Admitting: Urology

## 2021-03-01 ENCOUNTER — Encounter: Payer: Self-pay | Admitting: Dietician

## 2021-03-01 VITALS — BP 98/58 | Ht 70.0 in | Wt 245.8 lb

## 2021-03-01 DIAGNOSIS — I129 Hypertensive chronic kidney disease with stage 1 through stage 4 chronic kidney disease, or unspecified chronic kidney disease: Secondary | ICD-10-CM | POA: Insufficient documentation

## 2021-03-01 DIAGNOSIS — E1165 Type 2 diabetes mellitus with hyperglycemia: Secondary | ICD-10-CM

## 2021-03-01 DIAGNOSIS — Z794 Long term (current) use of insulin: Secondary | ICD-10-CM | POA: Insufficient documentation

## 2021-03-01 DIAGNOSIS — Z713 Dietary counseling and surveillance: Secondary | ICD-10-CM | POA: Insufficient documentation

## 2021-03-01 DIAGNOSIS — Z6838 Body mass index (BMI) 38.0-38.9, adult: Secondary | ICD-10-CM | POA: Diagnosis not present

## 2021-03-01 DIAGNOSIS — E1122 Type 2 diabetes mellitus with diabetic chronic kidney disease: Secondary | ICD-10-CM | POA: Diagnosis not present

## 2021-03-01 DIAGNOSIS — N1831 Chronic kidney disease, stage 3a: Secondary | ICD-10-CM | POA: Insufficient documentation

## 2021-03-01 DIAGNOSIS — N281 Cyst of kidney, acquired: Secondary | ICD-10-CM

## 2021-03-01 NOTE — Telephone Encounter (Signed)
Lumbar MRI report reviewed.  Renal cyst not present 2018 on CT.  Recommend renal ultrasound to further evaluate.  Order was entered and will contact with results.

## 2021-03-01 NOTE — Patient Instructions (Signed)
Reduce sweet tea -- go to 1/2-sweet or use other sweetener like Splenda. OK to have sugar free lemonade with unsweetened tea. Choose "diet" or "Zero sugar" sodas. Limit cherry juice to no more than 8oz, and drink slowly. Drink juice when eating a meal or snack that contains protein.  Keep any starch portions (pasta, rice, potatoes) to the size of a fisted hand -- often 1/3 - 1/2 of a restaurant portion. Avoid bread if you already have a starch, or limit to 1/2 - 1 piece.

## 2021-03-01 NOTE — Progress Notes (Signed)
Diabetes Self-Management Education  Visit Type:  Follow-up  Appt. Start Time: 1100 Appt. End Time: 1200  03/01/2021  [name], identified by name and date of birth, is a 71 y.o. male with a diagnosis of Diabetes:  .   ASSESSMENT  Blood pressure (!) 98/58, height 5\' 10"  (1.778 m), weight 245 lb 12.8 oz (111.5 kg). Body mass index is 35.27 kg/m.    Diabetes Self-Management Education - 86/76/19 5093       Complications   How often do you check your blood sugar? > 4 times/day    Fasting Blood glucose range (mg/dL) 70-129;130-179;180-200;>200   Time in Range: 83%, low 1%, high 16% for past 14 days   Postprandial Blood glucose range (mg/dL) 70-129;130-179;180-200;>200    Number of hypoglycemic episodes per month 2    Can you tell when your blood sugar is low? Yes    What do you do if your blood sugar is low? eat fruit ie grapes, frink juice or soda 4oz    Have you had a dilated eye exam in the past 12 months? Yes    Have you had a dental exam in the past 12 months? Yes   12/2020   Are you checking your feet? Yes    How many days per week are you checking your feet? 7      Dietary Intake   Beverage(s) water, coffee, sweet tea, occasional soda      Exercise   Exercise Type Light (walking / raking leaves)    How many days per week to you exercise? 5    How many minutes per day do you exercise? 14    Total minutes per week of exercise 70      Patient Education   Nutrition management  Role of diet in the treatment of diabetes and the relationship between the three main macronutrients and blood glucose level;Food label reading, portion sizes and measuring food.;Meal timing in regards to the patients' current diabetes medication.;Meal options for control of blood glucose level and chronic complications.;Other (comment)   basic meal planning, avoiding sugar sweetened beverages   Physical activity and exercise  Role of exercise on diabetes management, blood pressure control and  cardiac health.    Monitoring Taught/discussed recording of test results and interpretation of SMBG.    Acute complications Taught treatment of hypoglycemia - the 15 rule.      Post-Education Assessment   Patient understands the diabetes disease and treatment process. Demonstrates understanding / competency    Patient understands incorporating nutritional management into lifestyle. Demonstrates understanding / competency    Patient undertands incorporating physical activity into lifestyle. Demonstrates understanding / competency    Patient understands using medications safely. Demonstrates understanding / competency    Patient understands monitoring blood glucose, interpreting and using results Demonstrates understanding / competency    Patient understands prevention, detection, and treatment of acute complications. Demonstrates understanding / competency    Patient understands prevention, detection, and treatment of chronic complications. Demonstrates understanding / competency    Patient understands how to develop strategies to address psychosocial issues. Demonstrates understanding / competency    Patient understands how to develop strategies to promote health/change behavior. Demonstrates understanding / competency      Outcomes   Program Status Completed             Learning Objective:  Patient will have a greater understanding of diabetes self-management. Patient education plan is to attend individual and/or group sessions per assessed needs and  concerns.  Additional Notes:  Discussed goal of minimizing high and low BGs, time out of range. Instructed on importance of controlling amount of carb intake, and amount of sugar in sweetened beverages leading to significant and rapid rise in BGs. Discussed strategies for reducing sugar in beverages as well as controlling portions of starchy foods.  Patient has lost about 17lbs since his RN visit on 12/12/20, especially since starting  Ozempic. He reports decreased hunger and earlier satiety with Ozempic.    Plan:   Patient Instructions  Reduce sweet tea -- go to 1/2-sweet or use other sweetener like Splenda. OK to have sugar free lemonade with unsweetened tea. Choose "diet" or "Zero sugar" sodas. Limit cherry juice to no more than 8oz, and drink slowly. Drink juice when eating a meal or snack that contains protein.  Keep any starch portions (pasta, rice, potatoes) to the size of a fisted hand -- often 1/3 - 1/2 of a restaurant portion. Avoid bread if you already have a starch, or limit to 1/2 - 1 piece.   Expected Outcomes:  Demonstrated interest in learning. Expect positive outcomes  Education material provided: Plate Planner with sample meal pattern, Quick and Healthy Meal Ideas  If problems or questions, patient to contact team via:  Phone  Future DSME appointment: - PRN

## 2021-03-01 NOTE — Telephone Encounter (Signed)
Notified patient as instructed, patient pleased. Discussed follow-up appointments, patient agrees  

## 2021-03-02 DIAGNOSIS — J301 Allergic rhinitis due to pollen: Secondary | ICD-10-CM | POA: Diagnosis not present

## 2021-03-06 DIAGNOSIS — M9904 Segmental and somatic dysfunction of sacral region: Secondary | ICD-10-CM | POA: Diagnosis not present

## 2021-03-06 DIAGNOSIS — M9903 Segmental and somatic dysfunction of lumbar region: Secondary | ICD-10-CM | POA: Diagnosis not present

## 2021-03-06 DIAGNOSIS — M9901 Segmental and somatic dysfunction of cervical region: Secondary | ICD-10-CM | POA: Diagnosis not present

## 2021-03-06 DIAGNOSIS — M9905 Segmental and somatic dysfunction of pelvic region: Secondary | ICD-10-CM | POA: Diagnosis not present

## 2021-03-07 DIAGNOSIS — R262 Difficulty in walking, not elsewhere classified: Secondary | ICD-10-CM | POA: Diagnosis not present

## 2021-03-07 DIAGNOSIS — R293 Abnormal posture: Secondary | ICD-10-CM | POA: Diagnosis not present

## 2021-03-07 DIAGNOSIS — M542 Cervicalgia: Secondary | ICD-10-CM | POA: Diagnosis not present

## 2021-03-07 DIAGNOSIS — R2689 Other abnormalities of gait and mobility: Secondary | ICD-10-CM | POA: Diagnosis not present

## 2021-03-07 DIAGNOSIS — M5459 Other low back pain: Secondary | ICD-10-CM | POA: Diagnosis not present

## 2021-03-08 ENCOUNTER — Ambulatory Visit
Admission: RE | Admit: 2021-03-08 | Discharge: 2021-03-08 | Disposition: A | Discharge status: Home / Self Care | Payer: Self-pay | Source: Ambulatory Visit | Attending: Urology | Admitting: Urology

## 2021-03-08 ENCOUNTER — Ambulatory Visit
Admission: RE | Admit: 2021-03-08 | Discharge: 2021-03-08 | Disposition: A | Discharge status: Home / Self Care | Payer: HMO | Source: Ambulatory Visit | Attending: Urology | Admitting: Urology

## 2021-03-08 ENCOUNTER — Other Ambulatory Visit: Payer: Self-pay

## 2021-03-08 ENCOUNTER — Other Ambulatory Visit: Payer: Self-pay | Admitting: Urology

## 2021-03-08 DIAGNOSIS — N281 Cyst of kidney, acquired: Secondary | ICD-10-CM

## 2021-03-08 DIAGNOSIS — N2889 Other specified disorders of kidney and ureter: Secondary | ICD-10-CM | POA: Diagnosis not present

## 2021-03-09 ENCOUNTER — Telehealth: Payer: Self-pay | Admitting: *Deleted

## 2021-03-09 NOTE — Telephone Encounter (Signed)
Notified patient as instructed, patient pleased °

## 2021-03-09 NOTE — Telephone Encounter (Signed)
-----   Message from Abbie Sons, MD sent at 03/09/2021  3:13 PM EST ----- Renal ultrasound showed 2 cysts in the right kidney consistent with simple cysts.  No further follow-up imaging needed

## 2021-03-13 DIAGNOSIS — R2689 Other abnormalities of gait and mobility: Secondary | ICD-10-CM | POA: Diagnosis not present

## 2021-03-13 DIAGNOSIS — R262 Difficulty in walking, not elsewhere classified: Secondary | ICD-10-CM | POA: Diagnosis not present

## 2021-03-13 DIAGNOSIS — M542 Cervicalgia: Secondary | ICD-10-CM | POA: Diagnosis not present

## 2021-03-13 DIAGNOSIS — R293 Abnormal posture: Secondary | ICD-10-CM | POA: Diagnosis not present

## 2021-03-13 DIAGNOSIS — M5459 Other low back pain: Secondary | ICD-10-CM | POA: Diagnosis not present

## 2021-03-14 DIAGNOSIS — M9905 Segmental and somatic dysfunction of pelvic region: Secondary | ICD-10-CM | POA: Diagnosis not present

## 2021-03-14 DIAGNOSIS — M9901 Segmental and somatic dysfunction of cervical region: Secondary | ICD-10-CM | POA: Diagnosis not present

## 2021-03-14 DIAGNOSIS — M9904 Segmental and somatic dysfunction of sacral region: Secondary | ICD-10-CM | POA: Diagnosis not present

## 2021-03-14 DIAGNOSIS — M9903 Segmental and somatic dysfunction of lumbar region: Secondary | ICD-10-CM | POA: Diagnosis not present

## 2021-03-16 DIAGNOSIS — R2689 Other abnormalities of gait and mobility: Secondary | ICD-10-CM | POA: Diagnosis not present

## 2021-03-16 DIAGNOSIS — J301 Allergic rhinitis due to pollen: Secondary | ICD-10-CM | POA: Diagnosis not present

## 2021-03-16 DIAGNOSIS — R293 Abnormal posture: Secondary | ICD-10-CM | POA: Diagnosis not present

## 2021-03-16 DIAGNOSIS — M5459 Other low back pain: Secondary | ICD-10-CM | POA: Diagnosis not present

## 2021-03-16 DIAGNOSIS — M542 Cervicalgia: Secondary | ICD-10-CM | POA: Diagnosis not present

## 2021-03-16 DIAGNOSIS — R262 Difficulty in walking, not elsewhere classified: Secondary | ICD-10-CM | POA: Diagnosis not present

## 2021-03-21 DIAGNOSIS — R262 Difficulty in walking, not elsewhere classified: Secondary | ICD-10-CM | POA: Diagnosis not present

## 2021-03-21 DIAGNOSIS — M542 Cervicalgia: Secondary | ICD-10-CM | POA: Diagnosis not present

## 2021-03-21 DIAGNOSIS — R293 Abnormal posture: Secondary | ICD-10-CM | POA: Diagnosis not present

## 2021-03-21 DIAGNOSIS — R2689 Other abnormalities of gait and mobility: Secondary | ICD-10-CM | POA: Diagnosis not present

## 2021-03-21 DIAGNOSIS — M5459 Other low back pain: Secondary | ICD-10-CM | POA: Diagnosis not present

## 2021-03-22 DIAGNOSIS — M9901 Segmental and somatic dysfunction of cervical region: Secondary | ICD-10-CM | POA: Diagnosis not present

## 2021-03-22 DIAGNOSIS — M9903 Segmental and somatic dysfunction of lumbar region: Secondary | ICD-10-CM | POA: Diagnosis not present

## 2021-03-22 DIAGNOSIS — M9905 Segmental and somatic dysfunction of pelvic region: Secondary | ICD-10-CM | POA: Diagnosis not present

## 2021-03-22 DIAGNOSIS — M9904 Segmental and somatic dysfunction of sacral region: Secondary | ICD-10-CM | POA: Diagnosis not present

## 2021-03-23 DIAGNOSIS — R293 Abnormal posture: Secondary | ICD-10-CM | POA: Diagnosis not present

## 2021-03-23 DIAGNOSIS — M542 Cervicalgia: Secondary | ICD-10-CM | POA: Diagnosis not present

## 2021-03-23 DIAGNOSIS — J301 Allergic rhinitis due to pollen: Secondary | ICD-10-CM | POA: Diagnosis not present

## 2021-03-23 DIAGNOSIS — M5459 Other low back pain: Secondary | ICD-10-CM | POA: Diagnosis not present

## 2021-03-23 DIAGNOSIS — R2689 Other abnormalities of gait and mobility: Secondary | ICD-10-CM | POA: Diagnosis not present

## 2021-03-23 DIAGNOSIS — R262 Difficulty in walking, not elsewhere classified: Secondary | ICD-10-CM | POA: Diagnosis not present

## 2021-03-27 DIAGNOSIS — M5459 Other low back pain: Secondary | ICD-10-CM | POA: Diagnosis not present

## 2021-03-27 DIAGNOSIS — R2689 Other abnormalities of gait and mobility: Secondary | ICD-10-CM | POA: Diagnosis not present

## 2021-03-27 DIAGNOSIS — M542 Cervicalgia: Secondary | ICD-10-CM | POA: Diagnosis not present

## 2021-03-27 DIAGNOSIS — R262 Difficulty in walking, not elsewhere classified: Secondary | ICD-10-CM | POA: Diagnosis not present

## 2021-03-27 DIAGNOSIS — R293 Abnormal posture: Secondary | ICD-10-CM | POA: Diagnosis not present

## 2021-03-28 DIAGNOSIS — M9901 Segmental and somatic dysfunction of cervical region: Secondary | ICD-10-CM | POA: Diagnosis not present

## 2021-03-28 DIAGNOSIS — M9903 Segmental and somatic dysfunction of lumbar region: Secondary | ICD-10-CM | POA: Diagnosis not present

## 2021-03-28 DIAGNOSIS — M9905 Segmental and somatic dysfunction of pelvic region: Secondary | ICD-10-CM | POA: Diagnosis not present

## 2021-03-28 DIAGNOSIS — M9904 Segmental and somatic dysfunction of sacral region: Secondary | ICD-10-CM | POA: Diagnosis not present

## 2021-03-29 ENCOUNTER — Encounter: Payer: Self-pay | Admitting: *Deleted

## 2021-03-30 ENCOUNTER — Encounter: Payer: Self-pay | Admitting: *Deleted

## 2021-03-30 ENCOUNTER — Ambulatory Visit: Payer: HMO | Admitting: Anesthesiology

## 2021-03-30 ENCOUNTER — Encounter
Admission: RE | Disposition: A | Discharge status: Home / Self Care | Payer: Self-pay | Source: Home / Self Care | Attending: Gastroenterology

## 2021-03-30 ENCOUNTER — Ambulatory Visit
Admission: RE | Admit: 2021-03-30 | Discharge: 2021-03-30 | Disposition: A | Discharge status: Home / Self Care | Payer: HMO | Attending: Gastroenterology | Admitting: Gastroenterology

## 2021-03-30 DIAGNOSIS — E7800 Pure hypercholesterolemia, unspecified: Secondary | ICD-10-CM | POA: Diagnosis not present

## 2021-03-30 DIAGNOSIS — K219 Gastro-esophageal reflux disease without esophagitis: Secondary | ICD-10-CM | POA: Diagnosis not present

## 2021-03-30 DIAGNOSIS — E039 Hypothyroidism, unspecified: Secondary | ICD-10-CM | POA: Insufficient documentation

## 2021-03-30 DIAGNOSIS — E1140 Type 2 diabetes mellitus with diabetic neuropathy, unspecified: Secondary | ICD-10-CM | POA: Insufficient documentation

## 2021-03-30 DIAGNOSIS — K5730 Diverticulosis of large intestine without perforation or abscess without bleeding: Secondary | ICD-10-CM | POA: Insufficient documentation

## 2021-03-30 DIAGNOSIS — K635 Polyp of colon: Secondary | ICD-10-CM | POA: Diagnosis not present

## 2021-03-30 DIAGNOSIS — I10 Essential (primary) hypertension: Secondary | ICD-10-CM | POA: Diagnosis not present

## 2021-03-30 DIAGNOSIS — Z6833 Body mass index (BMI) 33.0-33.9, adult: Secondary | ICD-10-CM | POA: Diagnosis not present

## 2021-03-30 DIAGNOSIS — Z1211 Encounter for screening for malignant neoplasm of colon: Secondary | ICD-10-CM | POA: Insufficient documentation

## 2021-03-30 HISTORY — PX: COLONOSCOPY WITH PROPOFOL: SHX5780

## 2021-03-30 LAB — GLUCOSE, CAPILLARY: Glucose-Capillary: 129 mg/dL — ABNORMAL HIGH (ref 70–99)

## 2021-03-30 SURGERY — COLONOSCOPY WITH PROPOFOL
Anesthesia: General

## 2021-03-30 MED ORDER — PROPOFOL 10 MG/ML IV BOLUS
INTRAVENOUS | Status: DC | PRN
  Administered 2021-03-30: 40 mg via INTRAVENOUS
  Administered 2021-03-30: 60 mg via INTRAVENOUS

## 2021-03-30 MED ORDER — PROPOFOL 500 MG/50ML IV EMUL
INTRAVENOUS | Status: DC | PRN
  Administered 2021-03-30: 200 ug/kg/min via INTRAVENOUS

## 2021-03-30 MED ORDER — SODIUM CHLORIDE 0.9 % IV SOLN
INTRAVENOUS | Status: DC
  Administered 2021-03-30: 1000 mL via INTRAVENOUS

## 2021-03-30 NOTE — Anesthesia Procedure Notes (Signed)
Date/Time: 03/30/2021 7:53 AM ?Performed by: Nelda Marseille, CRNA ?Pre-anesthesia Checklist: Patient identified, Emergency Drugs available, Suction available, Patient being monitored and Timeout performed ?Oxygen Delivery Method: Nasal cannula ? ? ? ? ?

## 2021-03-30 NOTE — Interval H&P Note (Signed)
History and Physical Interval Note: ? ?03/30/2021 ?7:43 AM ? ?Nicholas Humphrey  has presented today for surgery, with the diagnosis of CCA SCREEN.  The various methods of treatment have been discussed with the patient and family. After consideration of risks, benefits and other options for treatment, the patient has consented to  Procedure(s) with comments: ?COLONOSCOPY WITH PROPOFOL (N/A) - IDDM as a surgical intervention.  The patient's history has been reviewed, patient examined, no change in status, stable for surgery.  I have reviewed the patient's chart and labs.  Questions were answered to the patient's satisfaction.   ? ? ?[name] ? ?Ok to proceed with colonoscopy ?

## 2021-03-30 NOTE — Anesthesia Postprocedure Evaluation (Signed)
Anesthesia Post Note ? ?Patient: Nicholas Humphrey ? ?Procedure(s) Performed: COLONOSCOPY WITH PROPOFOL ? ?Patient location during evaluation: PACU ?Anesthesia Type: General ?Level of consciousness: awake and awake and alert ?Pain management: satisfactory to patient ?Vital Signs Assessment: post-procedure vital signs reviewed and stable ?Respiratory status: spontaneous breathing ?Cardiovascular status: stable ?Anesthetic complications: no ? ? ?No notable events documented. ? ? ?Last Vitals:  ?Vitals:  ? 03/30/21 0822 03/30/21 0832  ?BP: 114/69 118/63  ?Pulse: 86 76  ?Resp: 10 19  ?Temp:    ?SpO2: 97% 99%  ?  ?Last Pain:  ?Vitals:  ? 03/30/21 0832  ?TempSrc:   ?PainSc: 0-No pain  ? ? ?  ?  ?  ?  ?  ?  ? ?VAN STAVEREN,Authur Cubit ? ? ? ? ?

## 2021-03-30 NOTE — Op Note (Signed)
[facility] ?Gastroenterology ?Patient Name: Nicholas Humphrey ?Procedure Date: 03/30/2021 6:55 AM ?MRN: [id number] ?Account #: [id number] ?Date of Birth: 08-31-1950 ?Admit Type: Outpatient ?Age: 71 ?Room: [facility] ENDO ROOM 3 ?Gender: Male ?Note Status: Finalized ?Instrument Name: Colonoscope [id number] ?Procedure:             Colonoscopy ?Indications:           Screening for colorectal malignant neoplasm ?Providers:             Andrey Farmer MD, MD ?Referring MD:          Juluis Rainier (Referring MD) ?Medicines:             Monitored Anesthesia Care ?Complications:         No immediate complications. Estimated blood loss:  ?                       Minimal. ?Procedure:             Pre-Anesthesia Assessment: ?                       - Prior to the procedure, a History and Physical was  ?                       performed, and patient medications and allergies were  ?                       reviewed. The patient is competent. The risks and  ?                       benefits of the procedure and the sedation options and  ?                       risks were discussed with the patient. All questions  ?                       were answered and informed consent was obtained.  ?                       Patient identification and proposed procedure were  ?                       verified by the physician, the nurse, the  ?                       anesthesiologist, the anesthetist and the technician  ?                       in the endoscopy suite. Mental Status Examination:  ?                       alert and oriented. Airway Examination: normal  ?                       oropharyngeal airway and neck mobility. Respiratory  ?                       Examination: clear to auscultation. CV Examination:  ?  normal. Prophylactic Antibiotics: The patient does not  ?                       require prophylactic antibiotics. Prior  ?                       Anticoagulants: The patient has taken no previous  ?                        anticoagulant or antiplatelet agents except for  ?                       aspirin. ASA Grade Assessment: II - A patient with  ?                       mild systemic disease. After reviewing the risks and  ?                       benefits, the patient was deemed in satisfactory  ?                       condition to undergo the procedure. The anesthesia  ?                       plan was to use monitored anesthesia care (MAC).  ?                       Immediately prior to administration of medications,  ?                       the patient was re-assessed for adequacy to receive  ?                       sedatives. The heart rate, respiratory rate, oxygen  ?                       saturations, blood pressure, adequacy of pulmonary  ?                       ventilation, and response to care were monitored  ?                       throughout the procedure. The physical status of the  ?                       patient was re-assessed after the procedure. ?                       After obtaining informed consent, the colonoscope was  ?                       passed under direct vision. Throughout the procedure,  ?                       the patient's blood pressure, pulse, and oxygen  ?                       saturations were monitored continuously. The  ?  Colonoscope was introduced through the anus and  ?                       advanced to the the cecum, identified by appendiceal  ?                       orifice and ileocecal valve. The colonoscopy was  ?                       performed without difficulty. The patient tolerated  ?                       the procedure well. The quality of the bowel  ?                       preparation was good. ?Findings: ?     The perianal and digital rectal examinations were normal. ?     A less than 1 mm polyp was found in the appendiceal orifice. The polyp  ?     was sessile. The polyp was removed with a jumbo cold forceps. Resection  ?     and retrieval were  complete. Estimated blood loss was minimal. ?     Scattered small-mouthed diverticula were found in the sigmoid colon,  ?     descending colon, transverse colon, hepatic flexure and ascending colon. ?     The exam was otherwise without abnormality on direct and retroflexion  ?     views. ?Impression:            - One less than 1 mm polyp at the appendiceal orifice,  ?                       removed with a jumbo cold forceps. Resected and  ?                       retrieved. ?                       - Diverticulosis in the sigmoid colon, in the  ?                       descending colon, in the transverse colon, at the  ?                       hepatic flexure and in the ascending colon. ?                       - The examination was otherwise normal on direct and  ?                       retroflexion views. ?Recommendation:        - Discharge patient to home. ?                       - Resume previous diet. ?                       - Continue present medications. ?                       -  Await pathology results. ?                       - Repeat colonoscopy in 10 years for surveillance. ?                       - Return to referring physician as previously  ?                       scheduled. ?Procedure Code(s):     --- Professional --- ?                       [phone], Colonoscopy, flexible; with biopsy, single or  ?                       multiple ?Diagnosis Code(s):     --- Professional --- ?                       Z12.11, Encounter for screening for malignant neoplasm  ?                       of colon ?                       K63.5, Polyp of colon ?                       K57.30, Diverticulosis of large intestine without  ?                       perforation or abscess without bleeding ?CPT copyright 2019 American Medical Association. All rights reserved. ?The codes documented in this report are preliminary and upon coder review may  ?be revised to meet current compliance requirements. ?Andrey Farmer MD, MD ?03/30/2021 8:12:58  AM ?Number of Addenda: 0 ?Note Initiated On: 03/30/2021 6:55 AM ?Scope Withdrawal Time: 0 hours 11 minutes 45 seconds  ?Total Procedure Duration: 0 hours 17 minutes 15 seconds  ?Estimated Blood Loss:  Estimated blood loss was minimal. ?     [facility] ?

## 2021-03-30 NOTE — Transfer of Care (Signed)
Immediate Anesthesia Transfer of Care Note ? ?Patient: Nicholas Humphrey ? ?Procedure(s) Performed: COLONOSCOPY WITH PROPOFOL ? ?Patient Location: PACU ? ?Anesthesia Type:General ? ?Level of Consciousness: sedated ? ?Airway & Oxygen Therapy: Patient Spontanous Breathing and Patient connected to nasal cannula oxygen ? ?Post-op Assessment: Report given to RN and Post -op Vital signs reviewed and stable ? ?Post vital signs: Reviewed and stable ? ?Last Vitals:  ?Vitals Value Taken Time  ?BP 101/59 03/30/21 0812  ?Temp 35.9 ?C 03/30/21 0812  ?Pulse 78 03/30/21 0813  ?Resp 13 03/30/21 0813  ?SpO2 95 % 03/30/21 0813  ?Vitals shown include unvalidated device data. ? ?Last Pain:  ?Vitals:  ? 03/30/21 0812  ?TempSrc: Tympanic  ?PainSc: Asleep  ?   ? ?  ? ?Complications: No notable events documented. ?

## 2021-03-30 NOTE — Anesthesia Preprocedure Evaluation (Signed)
Anesthesia Evaluation  ?Patient identified by MRN, date of birth, ID band ?Patient awake ? ? ? ?Reviewed: ?Allergy & Precautions, NPO status , Patient's Chart, lab work & pertinent test results ? ?History of Anesthesia Complications ?(+) PONV and history of anesthetic complications ? ?Airway ?Mallampati: II ? ?TM Distance: >3 FB ?Neck ROM: full ? ? ? Dental ? ?(+) Teeth Intact ?  ?Pulmonary ?neg pulmonary ROS,  ?  ?Pulmonary exam normal ? ?+ decreased breath sounds ? ? ? ? ? Cardiovascular ?Exercise Tolerance: Good ?hypertension, Pt. on medications ?negative cardio ROS ?Normal cardiovascular exam ?Rhythm:Regular Rate:Normal ? ? ?  ?Neuro/Psych ?negative neurological ROS ? negative psych ROS  ? GI/Hepatic ?negative GI ROS, Neg liver ROS, GERD  ,  ?Endo/Other  ?negative endocrine ROSdiabetes, Well Controlled, Type 1Morbid obesity ? Renal/GU ?negative Renal ROS  ?negative genitourinary ?  ?Musculoskeletal ?negative musculoskeletal ROS ?(+)  ? Abdominal ?(+) + obese,   ?Peds ?negative pediatric ROS ?(+)  Hematology ?negative hematology ROS ?(+)   ?Anesthesia Other Findings ?Past Medical History: ?No date: Diabetes mellitus without complication ([place]) ?No date: Diabetic neuropathy ([place]) ?2018: Fall from ladder ?    Comment:  LANDED ON CONCRETE ?No date: GERD (gastroesophageal reflux disease) ?No date: Hypercholesteremia ?No date: Hypertension ?No date: Hypothyroidism ?No date: PONV (postoperative nausea and vomiting) ?No date: Wears contact lenses ? ?Past Surgical History: ?No date: APPENDECTOMY ?2019: BACK SURGERY ?08/18/2019: CARPAL TUNNEL RELEASE; Right ?    Comment:  Procedure: ENDOSCOPIC RIGHT CARPAL TUNNEL RELEASE ,  ?             RELEASE RIGHT LONG TRIGGER FINGER. INDEX TRIGGER AND  ?             TRIGGER THUMB RELEASE;  Surgeon: Corky Mull, MD;   ?             Location: [facility] ORS;  Service: Orthopedics;  Laterality:  ?             Right; ?2018: ELBOW SURGERY; Left ?    Comment:   X2 ?No date: FRACTURE SURGERY; Left ?    Comment:  SEVERAL BROKEN BONES AFTER FALL ?2018: HAND SURGERY; Left ?    Comment:  HARDWARE REMOVED ?2018: HAND SURGERY; Left ?    Comment:  HARDWARE AFTER FALL ?No date: INGUINAL HERNIA REPAIR; Right ?08/17/2010: KNEE ARTHROSCOPY W/ PARTIAL MEDIAL MENISCECTOMY; Left ?No date: LUMBAR LAMINECTOMY ?2013: [place]; Right ?10/04/2015: TRIGGER FINGER RELEASE; Right ?    Comment:  Procedure: RELEASE TRIGGER FINGER/A-1 PULLEY;  Surgeon:  ?             Corky Mull, MD;  Location: [facility];   ?             Service: Orthopedics;  Laterality: Right;  PREFER BIER  ?             BLOCK ?Diabetic - insulin and oral meds ?No date: TYMPANOPLASTY; Right ? ? ? ? Reproductive/Obstetrics ?negative OB ROS ? ?  ? ? ? ? ? ? ? ? ? ? ? ? ? ?  ?  ? ? ? ? ? ? ? ? ?Anesthesia Physical ?Anesthesia Plan ? ?ASA: 3 ? ?Anesthesia Plan: General  ? ?Post-op Pain Management:   ? ?Induction: Intravenous ? ?PONV Risk Score and Plan: Ondansetron ? ?Airway Management Planned: Natural Airway and Nasal Cannula ? ?Additional Equipment:  ? ?Intra-op Plan:  ? ?Post-operative Plan:  ? ?Informed Consent: I have reviewed the patients  History and Physical, chart, labs and discussed the procedure including the risks, benefits and alternatives for the proposed anesthesia with the patient or authorized representative who has indicated his/her understanding and acceptance.  ? ? ? ?Dental Advisory Given ? ?Plan Discussed with: CRNA and Surgeon ? ?Anesthesia Plan Comments:   ? ? ? ? ? ? ?Anesthesia Quick Evaluation ? ?

## 2021-03-30 NOTE — H&P (Signed)
Outpatient short stay form Pre-procedure ?03/30/2021  ?Lesly Rubenstein, MD ? ?Primary Physician: Sallee Lange, NP ? ?Reason for visit:  Screening ? ?History of present illness:   ? ?71 y/o gentleman with history of hypothyroidism, DM II, and HLD here for screening colonoscopy. Had normal colonoscopy in 2014. No blood thinners. No family history of GI malignancies. History of inguinal hernia repair and appendectomy. ? ? ? ?Current Facility-Administered Medications:  ?  0.9 %  sodium chloride infusion, , Intravenous, Continuous, Robbie Rideaux, Hilton Cork, MD ? ?Medications Prior to Admission  ?Medication Sig Dispense Refill Last Dose  ? allopurinol (ZYLOPRIM) 100 MG tablet Take 100 mg by mouth daily.   Past Week  ? aspirin 81 MG tablet Take 81 mg by mouth daily.   Past Week  ? colchicine 0.6 MG tablet    Past Week  ? Continuous Blood Gluc Sensor (FREESTYLE LIBRE 2 SENSOR) MISC    03/29/2021  ? ELDERBERRY PO Take 1 capsule by mouth daily.   Past Week  ? fluticasone-salmeterol (ADVAIR) 250-50 MCG/ACT AEPB Inhale 1 puff into the lungs 2 (two) times daily.   Past Week  ? gabapentin (NEURONTIN) 300 MG capsule Take 1 capsule by mouth 3 (three) times daily.   Past Week  ? insulin aspart (NOVOLOG FLEXPEN) 100 UNIT/ML FlexPen Novolog FlexPen U-100 Insulin   03/29/2021  ? insulin degludec (TRESIBA) 200 UNIT/ML FlexTouch Pen Inject 124 Units into the skin daily.    Past Week  ? levothyroxine (SYNTHROID) 137 MCG tablet Take 137 mcg by mouth daily.   Past Week  ? loratadine (CLARITIN) 10 MG tablet Take 10 mg by mouth daily.   Past Week  ? LORazepam (ATIVAN) 0.5 MG tablet Ativan 0.5 mg tablet ? Take 1 tablet 45 minutes prior to procedure. May repeat after 30 minutes prn.MUST BRING A DRIVER FOR PROCEDURE.   Past Week  ? losartan-hydrochlorothiazide (HYZAAR) 50-12.5 MG tablet Take 1 tablet by mouth daily.   03/30/2021 at 0600  ? magnesium oxide (MAG-OX) 400 MG tablet Take 400 mg by mouth daily.   Past Week  ? metFORMIN (GLUCOPHAGE)  1000 MG tablet Take 1,000-1,500 mg by mouth See admin instructions. 1000 mg in AM. 1500 mg in PM.   Past Week  ? montelukast (SINGULAIR) 10 MG tablet Take 10 mg by mouth at bedtime.   Past Week  ? Multiple Vitamin (MULTIVITAMIN) tablet Take 1 tablet by mouth daily.   Past Week  ? pantoprazole (PROTONIX) 40 MG tablet Take 40 mg by mouth 2 (two) times daily.   Past Week  ? PARoxetine (PAXIL) 20 MG tablet Take 20 mg by mouth daily.    Past Week  ? Semaglutide,0.25 or 0.'5MG'$ /DOS, 2 MG/1.5ML SOPN Inject into the skin.   Past Week  ? simvastatin (ZOCOR) 40 MG tablet Take 40 mg by mouth at bedtime.    Past Week  ? tamsulosin (FLOMAX) 0.4 MG CAPS capsule Take 1 capsule (0.4 mg total) by mouth daily. 90 capsule 3 Past Week  ? vitamin B-12 (CYANOCOBALAMIN) 500 MCG tablet Take 500 mcg by mouth daily.   Past Week  ? EPINEPHrine 0.3 mg/0.3 mL IJ SOAJ injection Inject into the muscle.    at prn  ? methocarbamol (ROBAXIN) 500 MG tablet Take 500 mg by mouth 2 (two) times daily. (Patient not taking: Reported on 03/30/2021)   Not Taking  ? naproxen (NAPROSYN) 500 MG tablet Take 500 mg by mouth 2 (two) times daily. (Patient not taking: Reported on 03/01/2021)     ?  sildenafil (VIAGRA) 100 MG tablet Take 1 tab 1 hour prior to intercourse 10 tablet 0  at prn  ? ? ? ?Allergies  ?Allergen Reactions  ? Pregabalin Swelling  ?  Legs and ankles  ? Oxycodone Nausea And Vomiting  ?  Other reaction(s): Hallucinations, Vomiting  ? Crestor [Rosuvastatin]   ?  Muscle pain  ? Doxycycline Nausea Only  ? Lipitor [Atorvastatin]   ?  Muscle pain  ? Tramadol Anxiety  ?  Other reaction(s): Hallucinations  ? ? ? ?Past Medical History:  ?Diagnosis Date  ? Diabetes mellitus without complication ([place])   ? Diabetic neuropathy ([place])   ? Fall from ladder 2018  ? LANDED ON CONCRETE  ? GERD (gastroesophageal reflux disease)   ? Hypercholesteremia   ? Hypertension   ? Hypothyroidism   ? PONV (postoperative nausea and vomiting)   ? Wears contact lenses   ? ? ?Review of  systems:  Otherwise negative.  ? ? ?Physical Exam ? ?Gen: Alert, oriented. Appears stated age.  ?HEENT: PERRLA. ?Lungs: No respiratory distress ?CV: RRR ?Abd: soft, benign, no masses ?Ext: No edema ? ? ? ?Planned procedures: Proceed with colonoscopy. The patient understands the nature of the planned procedure, indications, risks, alternatives and potential complications including but not limited to bleeding, infection, perforation, damage to internal organs and possible oversedation/side effects from anesthesia. The patient agrees and gives consent to proceed.  ?Please refer to procedure notes for findings, recommendations and patient disposition/instructions.  ? ? ? ?Lesly Rubenstein, MD ?[facility] ? ? ? ?  ? ?

## 2021-04-02 ENCOUNTER — Encounter: Payer: Self-pay | Admitting: Gastroenterology

## 2021-04-03 DIAGNOSIS — M542 Cervicalgia: Secondary | ICD-10-CM | POA: Diagnosis not present

## 2021-04-03 DIAGNOSIS — R293 Abnormal posture: Secondary | ICD-10-CM | POA: Diagnosis not present

## 2021-04-03 DIAGNOSIS — R2689 Other abnormalities of gait and mobility: Secondary | ICD-10-CM | POA: Diagnosis not present

## 2021-04-03 DIAGNOSIS — R262 Difficulty in walking, not elsewhere classified: Secondary | ICD-10-CM | POA: Diagnosis not present

## 2021-04-03 DIAGNOSIS — M5459 Other low back pain: Secondary | ICD-10-CM | POA: Diagnosis not present

## 2021-04-03 LAB — SURGICAL PATHOLOGY

## 2021-04-04 DIAGNOSIS — M9901 Segmental and somatic dysfunction of cervical region: Secondary | ICD-10-CM | POA: Diagnosis not present

## 2021-04-04 DIAGNOSIS — M9905 Segmental and somatic dysfunction of pelvic region: Secondary | ICD-10-CM | POA: Diagnosis not present

## 2021-04-04 DIAGNOSIS — M9903 Segmental and somatic dysfunction of lumbar region: Secondary | ICD-10-CM | POA: Diagnosis not present

## 2021-04-04 DIAGNOSIS — M9904 Segmental and somatic dysfunction of sacral region: Secondary | ICD-10-CM | POA: Diagnosis not present

## 2021-04-05 DIAGNOSIS — M542 Cervicalgia: Secondary | ICD-10-CM | POA: Diagnosis not present

## 2021-04-05 DIAGNOSIS — M5459 Other low back pain: Secondary | ICD-10-CM | POA: Diagnosis not present

## 2021-04-05 DIAGNOSIS — R293 Abnormal posture: Secondary | ICD-10-CM | POA: Diagnosis not present

## 2021-04-05 DIAGNOSIS — R2689 Other abnormalities of gait and mobility: Secondary | ICD-10-CM | POA: Diagnosis not present

## 2021-04-05 DIAGNOSIS — R262 Difficulty in walking, not elsewhere classified: Secondary | ICD-10-CM | POA: Diagnosis not present

## 2021-04-06 DIAGNOSIS — J0101 Acute recurrent maxillary sinusitis: Secondary | ICD-10-CM | POA: Diagnosis not present

## 2021-04-06 DIAGNOSIS — R0989 Other specified symptoms and signs involving the circulatory and respiratory systems: Secondary | ICD-10-CM | POA: Diagnosis not present

## 2021-04-06 DIAGNOSIS — H6122 Impacted cerumen, left ear: Secondary | ICD-10-CM | POA: Diagnosis not present

## 2021-04-06 DIAGNOSIS — J301 Allergic rhinitis due to pollen: Secondary | ICD-10-CM | POA: Diagnosis not present

## 2021-04-06 DIAGNOSIS — R051 Acute cough: Secondary | ICD-10-CM | POA: Diagnosis not present

## 2021-04-09 DIAGNOSIS — R293 Abnormal posture: Secondary | ICD-10-CM | POA: Diagnosis not present

## 2021-04-09 DIAGNOSIS — M5459 Other low back pain: Secondary | ICD-10-CM | POA: Diagnosis not present

## 2021-04-09 DIAGNOSIS — R2689 Other abnormalities of gait and mobility: Secondary | ICD-10-CM | POA: Diagnosis not present

## 2021-04-09 DIAGNOSIS — M542 Cervicalgia: Secondary | ICD-10-CM | POA: Diagnosis not present

## 2021-04-09 DIAGNOSIS — R262 Difficulty in walking, not elsewhere classified: Secondary | ICD-10-CM | POA: Diagnosis not present

## 2021-04-11 DIAGNOSIS — M9901 Segmental and somatic dysfunction of cervical region: Secondary | ICD-10-CM | POA: Diagnosis not present

## 2021-04-11 DIAGNOSIS — M9904 Segmental and somatic dysfunction of sacral region: Secondary | ICD-10-CM | POA: Diagnosis not present

## 2021-04-11 DIAGNOSIS — M9903 Segmental and somatic dysfunction of lumbar region: Secondary | ICD-10-CM | POA: Diagnosis not present

## 2021-04-11 DIAGNOSIS — M9905 Segmental and somatic dysfunction of pelvic region: Secondary | ICD-10-CM | POA: Diagnosis not present

## 2021-04-12 DIAGNOSIS — R293 Abnormal posture: Secondary | ICD-10-CM | POA: Diagnosis not present

## 2021-04-12 DIAGNOSIS — M5459 Other low back pain: Secondary | ICD-10-CM | POA: Diagnosis not present

## 2021-04-12 DIAGNOSIS — R2689 Other abnormalities of gait and mobility: Secondary | ICD-10-CM | POA: Diagnosis not present

## 2021-04-12 DIAGNOSIS — M542 Cervicalgia: Secondary | ICD-10-CM | POA: Diagnosis not present

## 2021-04-12 DIAGNOSIS — R262 Difficulty in walking, not elsewhere classified: Secondary | ICD-10-CM | POA: Diagnosis not present

## 2021-04-17 DIAGNOSIS — M5459 Other low back pain: Secondary | ICD-10-CM | POA: Diagnosis not present

## 2021-04-17 DIAGNOSIS — R293 Abnormal posture: Secondary | ICD-10-CM | POA: Diagnosis not present

## 2021-04-17 DIAGNOSIS — M542 Cervicalgia: Secondary | ICD-10-CM | POA: Diagnosis not present

## 2021-04-17 DIAGNOSIS — R2689 Other abnormalities of gait and mobility: Secondary | ICD-10-CM | POA: Diagnosis not present

## 2021-04-17 DIAGNOSIS — R262 Difficulty in walking, not elsewhere classified: Secondary | ICD-10-CM | POA: Diagnosis not present

## 2021-04-18 DIAGNOSIS — M9903 Segmental and somatic dysfunction of lumbar region: Secondary | ICD-10-CM | POA: Diagnosis not present

## 2021-04-18 DIAGNOSIS — M9905 Segmental and somatic dysfunction of pelvic region: Secondary | ICD-10-CM | POA: Diagnosis not present

## 2021-04-18 DIAGNOSIS — M9904 Segmental and somatic dysfunction of sacral region: Secondary | ICD-10-CM | POA: Diagnosis not present

## 2021-04-18 DIAGNOSIS — M9901 Segmental and somatic dysfunction of cervical region: Secondary | ICD-10-CM | POA: Diagnosis not present

## 2021-04-19 DIAGNOSIS — R2689 Other abnormalities of gait and mobility: Secondary | ICD-10-CM | POA: Diagnosis not present

## 2021-04-19 DIAGNOSIS — M5459 Other low back pain: Secondary | ICD-10-CM | POA: Diagnosis not present

## 2021-04-19 DIAGNOSIS — M542 Cervicalgia: Secondary | ICD-10-CM | POA: Diagnosis not present

## 2021-04-19 DIAGNOSIS — R262 Difficulty in walking, not elsewhere classified: Secondary | ICD-10-CM | POA: Diagnosis not present

## 2021-04-19 DIAGNOSIS — R293 Abnormal posture: Secondary | ICD-10-CM | POA: Diagnosis not present

## 2021-04-20 DIAGNOSIS — J301 Allergic rhinitis due to pollen: Secondary | ICD-10-CM | POA: Diagnosis not present

## 2021-04-25 DIAGNOSIS — M9901 Segmental and somatic dysfunction of cervical region: Secondary | ICD-10-CM | POA: Diagnosis not present

## 2021-04-25 DIAGNOSIS — M9905 Segmental and somatic dysfunction of pelvic region: Secondary | ICD-10-CM | POA: Diagnosis not present

## 2021-04-25 DIAGNOSIS — M9903 Segmental and somatic dysfunction of lumbar region: Secondary | ICD-10-CM | POA: Diagnosis not present

## 2021-04-25 DIAGNOSIS — M9904 Segmental and somatic dysfunction of sacral region: Secondary | ICD-10-CM | POA: Diagnosis not present

## 2021-05-01 DIAGNOSIS — M5459 Other low back pain: Secondary | ICD-10-CM | POA: Diagnosis not present

## 2021-05-01 DIAGNOSIS — R293 Abnormal posture: Secondary | ICD-10-CM | POA: Diagnosis not present

## 2021-05-01 DIAGNOSIS — M542 Cervicalgia: Secondary | ICD-10-CM | POA: Diagnosis not present

## 2021-05-01 DIAGNOSIS — R2689 Other abnormalities of gait and mobility: Secondary | ICD-10-CM | POA: Diagnosis not present

## 2021-05-01 DIAGNOSIS — R262 Difficulty in walking, not elsewhere classified: Secondary | ICD-10-CM | POA: Diagnosis not present

## 2021-05-02 DIAGNOSIS — M9903 Segmental and somatic dysfunction of lumbar region: Secondary | ICD-10-CM | POA: Diagnosis not present

## 2021-05-02 DIAGNOSIS — M9905 Segmental and somatic dysfunction of pelvic region: Secondary | ICD-10-CM | POA: Diagnosis not present

## 2021-05-02 DIAGNOSIS — M9901 Segmental and somatic dysfunction of cervical region: Secondary | ICD-10-CM | POA: Diagnosis not present

## 2021-05-02 DIAGNOSIS — M9904 Segmental and somatic dysfunction of sacral region: Secondary | ICD-10-CM | POA: Diagnosis not present

## 2021-05-03 DIAGNOSIS — R2689 Other abnormalities of gait and mobility: Secondary | ICD-10-CM | POA: Diagnosis not present

## 2021-05-03 DIAGNOSIS — R262 Difficulty in walking, not elsewhere classified: Secondary | ICD-10-CM | POA: Diagnosis not present

## 2021-05-03 DIAGNOSIS — M5459 Other low back pain: Secondary | ICD-10-CM | POA: Diagnosis not present

## 2021-05-03 DIAGNOSIS — M542 Cervicalgia: Secondary | ICD-10-CM | POA: Diagnosis not present

## 2021-05-03 DIAGNOSIS — R293 Abnormal posture: Secondary | ICD-10-CM | POA: Diagnosis not present

## 2021-05-04 DIAGNOSIS — J301 Allergic rhinitis due to pollen: Secondary | ICD-10-CM | POA: Diagnosis not present

## 2021-05-08 DIAGNOSIS — M5459 Other low back pain: Secondary | ICD-10-CM | POA: Diagnosis not present

## 2021-05-08 DIAGNOSIS — R262 Difficulty in walking, not elsewhere classified: Secondary | ICD-10-CM | POA: Diagnosis not present

## 2021-05-08 DIAGNOSIS — M542 Cervicalgia: Secondary | ICD-10-CM | POA: Diagnosis not present

## 2021-05-08 DIAGNOSIS — R2689 Other abnormalities of gait and mobility: Secondary | ICD-10-CM | POA: Diagnosis not present

## 2021-05-08 DIAGNOSIS — J301 Allergic rhinitis due to pollen: Secondary | ICD-10-CM | POA: Diagnosis not present

## 2021-05-08 DIAGNOSIS — R293 Abnormal posture: Secondary | ICD-10-CM | POA: Diagnosis not present

## 2021-05-10 DIAGNOSIS — M542 Cervicalgia: Secondary | ICD-10-CM | POA: Diagnosis not present

## 2021-05-10 DIAGNOSIS — M5459 Other low back pain: Secondary | ICD-10-CM | POA: Diagnosis not present

## 2021-05-10 DIAGNOSIS — R262 Difficulty in walking, not elsewhere classified: Secondary | ICD-10-CM | POA: Diagnosis not present

## 2021-05-10 DIAGNOSIS — R293 Abnormal posture: Secondary | ICD-10-CM | POA: Diagnosis not present

## 2021-05-10 DIAGNOSIS — R2689 Other abnormalities of gait and mobility: Secondary | ICD-10-CM | POA: Diagnosis not present

## 2021-05-11 DIAGNOSIS — J301 Allergic rhinitis due to pollen: Secondary | ICD-10-CM | POA: Diagnosis not present

## 2021-05-15 DIAGNOSIS — M542 Cervicalgia: Secondary | ICD-10-CM | POA: Diagnosis not present

## 2021-05-15 DIAGNOSIS — R2689 Other abnormalities of gait and mobility: Secondary | ICD-10-CM | POA: Diagnosis not present

## 2021-05-15 DIAGNOSIS — M5459 Other low back pain: Secondary | ICD-10-CM | POA: Diagnosis not present

## 2021-05-15 DIAGNOSIS — R262 Difficulty in walking, not elsewhere classified: Secondary | ICD-10-CM | POA: Diagnosis not present

## 2021-05-15 DIAGNOSIS — R293 Abnormal posture: Secondary | ICD-10-CM | POA: Diagnosis not present

## 2021-05-16 DIAGNOSIS — B351 Tinea unguium: Secondary | ICD-10-CM | POA: Diagnosis not present

## 2021-05-16 DIAGNOSIS — Z794 Long term (current) use of insulin: Secondary | ICD-10-CM | POA: Diagnosis not present

## 2021-05-16 DIAGNOSIS — E1142 Type 2 diabetes mellitus with diabetic polyneuropathy: Secondary | ICD-10-CM | POA: Diagnosis not present

## 2021-05-17 DIAGNOSIS — R059 Cough, unspecified: Secondary | ICD-10-CM | POA: Diagnosis not present

## 2021-05-17 DIAGNOSIS — R051 Acute cough: Secondary | ICD-10-CM | POA: Diagnosis not present

## 2021-05-17 DIAGNOSIS — J0101 Acute recurrent maxillary sinusitis: Secondary | ICD-10-CM | POA: Diagnosis not present

## 2021-05-24 DIAGNOSIS — E039 Hypothyroidism, unspecified: Secondary | ICD-10-CM | POA: Diagnosis not present

## 2021-05-24 DIAGNOSIS — E538 Deficiency of other specified B group vitamins: Secondary | ICD-10-CM | POA: Diagnosis not present

## 2021-05-24 DIAGNOSIS — E782 Mixed hyperlipidemia: Secondary | ICD-10-CM | POA: Diagnosis not present

## 2021-05-24 DIAGNOSIS — I10 Essential (primary) hypertension: Secondary | ICD-10-CM | POA: Diagnosis not present

## 2021-05-24 DIAGNOSIS — M1A0720 Idiopathic chronic gout, left ankle and foot, without tophus (tophi): Secondary | ICD-10-CM | POA: Diagnosis not present

## 2021-05-24 DIAGNOSIS — Z79899 Other long term (current) drug therapy: Secondary | ICD-10-CM | POA: Diagnosis not present

## 2021-05-24 DIAGNOSIS — N1831 Chronic kidney disease, stage 3a: Secondary | ICD-10-CM | POA: Diagnosis not present

## 2021-05-24 DIAGNOSIS — J449 Chronic obstructive pulmonary disease, unspecified: Secondary | ICD-10-CM | POA: Diagnosis not present

## 2021-05-24 DIAGNOSIS — E6609 Other obesity due to excess calories: Secondary | ICD-10-CM | POA: Diagnosis not present

## 2021-05-24 DIAGNOSIS — Z794 Long term (current) use of insulin: Secondary | ICD-10-CM | POA: Diagnosis not present

## 2021-05-24 DIAGNOSIS — K219 Gastro-esophageal reflux disease without esophagitis: Secondary | ICD-10-CM | POA: Diagnosis not present

## 2021-05-24 DIAGNOSIS — E1122 Type 2 diabetes mellitus with diabetic chronic kidney disease: Secondary | ICD-10-CM | POA: Diagnosis not present

## 2021-05-24 DIAGNOSIS — I700 Atherosclerosis of aorta: Secondary | ICD-10-CM | POA: Diagnosis not present

## 2021-05-24 DIAGNOSIS — F418 Other specified anxiety disorders: Secondary | ICD-10-CM | POA: Diagnosis not present

## 2021-05-30 DIAGNOSIS — J189 Pneumonia, unspecified organism: Secondary | ICD-10-CM | POA: Diagnosis not present

## 2021-05-30 DIAGNOSIS — J188 Other pneumonia, unspecified organism: Secondary | ICD-10-CM | POA: Diagnosis not present

## 2021-06-06 DIAGNOSIS — M9901 Segmental and somatic dysfunction of cervical region: Secondary | ICD-10-CM | POA: Diagnosis not present

## 2021-06-06 DIAGNOSIS — M9903 Segmental and somatic dysfunction of lumbar region: Secondary | ICD-10-CM | POA: Diagnosis not present

## 2021-06-06 DIAGNOSIS — M9905 Segmental and somatic dysfunction of pelvic region: Secondary | ICD-10-CM | POA: Diagnosis not present

## 2021-06-06 DIAGNOSIS — M9904 Segmental and somatic dysfunction of sacral region: Secondary | ICD-10-CM | POA: Diagnosis not present

## 2021-06-08 DIAGNOSIS — J301 Allergic rhinitis due to pollen: Secondary | ICD-10-CM | POA: Diagnosis not present

## 2021-06-15 DIAGNOSIS — J301 Allergic rhinitis due to pollen: Secondary | ICD-10-CM | POA: Diagnosis not present

## 2021-06-20 DIAGNOSIS — M5459 Other low back pain: Secondary | ICD-10-CM | POA: Diagnosis not present

## 2021-06-20 DIAGNOSIS — M542 Cervicalgia: Secondary | ICD-10-CM | POA: Diagnosis not present

## 2021-06-20 DIAGNOSIS — R262 Difficulty in walking, not elsewhere classified: Secondary | ICD-10-CM | POA: Diagnosis not present

## 2021-06-20 DIAGNOSIS — R2689 Other abnormalities of gait and mobility: Secondary | ICD-10-CM | POA: Diagnosis not present

## 2021-06-20 DIAGNOSIS — R293 Abnormal posture: Secondary | ICD-10-CM | POA: Diagnosis not present

## 2021-06-22 DIAGNOSIS — J301 Allergic rhinitis due to pollen: Secondary | ICD-10-CM | POA: Diagnosis not present

## 2021-06-26 DIAGNOSIS — M542 Cervicalgia: Secondary | ICD-10-CM | POA: Diagnosis not present

## 2021-06-26 DIAGNOSIS — R293 Abnormal posture: Secondary | ICD-10-CM | POA: Diagnosis not present

## 2021-06-26 DIAGNOSIS — M5459 Other low back pain: Secondary | ICD-10-CM | POA: Diagnosis not present

## 2021-06-26 DIAGNOSIS — R2689 Other abnormalities of gait and mobility: Secondary | ICD-10-CM | POA: Diagnosis not present

## 2021-06-26 DIAGNOSIS — R262 Difficulty in walking, not elsewhere classified: Secondary | ICD-10-CM | POA: Diagnosis not present

## 2021-06-28 DIAGNOSIS — M5459 Other low back pain: Secondary | ICD-10-CM | POA: Diagnosis not present

## 2021-06-28 DIAGNOSIS — R262 Difficulty in walking, not elsewhere classified: Secondary | ICD-10-CM | POA: Diagnosis not present

## 2021-06-28 DIAGNOSIS — M542 Cervicalgia: Secondary | ICD-10-CM | POA: Diagnosis not present

## 2021-06-28 DIAGNOSIS — R293 Abnormal posture: Secondary | ICD-10-CM | POA: Diagnosis not present

## 2021-06-28 DIAGNOSIS — R2689 Other abnormalities of gait and mobility: Secondary | ICD-10-CM | POA: Diagnosis not present

## 2021-06-29 DIAGNOSIS — J301 Allergic rhinitis due to pollen: Secondary | ICD-10-CM | POA: Diagnosis not present

## 2021-07-03 DIAGNOSIS — R262 Difficulty in walking, not elsewhere classified: Secondary | ICD-10-CM | POA: Diagnosis not present

## 2021-07-03 DIAGNOSIS — M5459 Other low back pain: Secondary | ICD-10-CM | POA: Diagnosis not present

## 2021-07-03 DIAGNOSIS — M542 Cervicalgia: Secondary | ICD-10-CM | POA: Diagnosis not present

## 2021-07-03 DIAGNOSIS — R293 Abnormal posture: Secondary | ICD-10-CM | POA: Diagnosis not present

## 2021-07-03 DIAGNOSIS — R2689 Other abnormalities of gait and mobility: Secondary | ICD-10-CM | POA: Diagnosis not present

## 2021-07-04 DIAGNOSIS — M9904 Segmental and somatic dysfunction of sacral region: Secondary | ICD-10-CM | POA: Diagnosis not present

## 2021-07-04 DIAGNOSIS — M9903 Segmental and somatic dysfunction of lumbar region: Secondary | ICD-10-CM | POA: Diagnosis not present

## 2021-07-04 DIAGNOSIS — M9901 Segmental and somatic dysfunction of cervical region: Secondary | ICD-10-CM | POA: Diagnosis not present

## 2021-07-04 DIAGNOSIS — M9905 Segmental and somatic dysfunction of pelvic region: Secondary | ICD-10-CM | POA: Diagnosis not present

## 2021-07-05 DIAGNOSIS — R293 Abnormal posture: Secondary | ICD-10-CM | POA: Diagnosis not present

## 2021-07-05 DIAGNOSIS — M5459 Other low back pain: Secondary | ICD-10-CM | POA: Diagnosis not present

## 2021-07-05 DIAGNOSIS — R262 Difficulty in walking, not elsewhere classified: Secondary | ICD-10-CM | POA: Diagnosis not present

## 2021-07-05 DIAGNOSIS — R2689 Other abnormalities of gait and mobility: Secondary | ICD-10-CM | POA: Diagnosis not present

## 2021-07-05 DIAGNOSIS — M542 Cervicalgia: Secondary | ICD-10-CM | POA: Diagnosis not present

## 2021-07-06 DIAGNOSIS — J301 Allergic rhinitis due to pollen: Secondary | ICD-10-CM | POA: Diagnosis not present

## 2021-07-10 DIAGNOSIS — M5459 Other low back pain: Secondary | ICD-10-CM | POA: Diagnosis not present

## 2021-07-10 DIAGNOSIS — R2689 Other abnormalities of gait and mobility: Secondary | ICD-10-CM | POA: Diagnosis not present

## 2021-07-10 DIAGNOSIS — M542 Cervicalgia: Secondary | ICD-10-CM | POA: Diagnosis not present

## 2021-07-10 DIAGNOSIS — R262 Difficulty in walking, not elsewhere classified: Secondary | ICD-10-CM | POA: Diagnosis not present

## 2021-07-10 DIAGNOSIS — R293 Abnormal posture: Secondary | ICD-10-CM | POA: Diagnosis not present

## 2021-07-13 DIAGNOSIS — M5459 Other low back pain: Secondary | ICD-10-CM | POA: Diagnosis not present

## 2021-07-13 DIAGNOSIS — M542 Cervicalgia: Secondary | ICD-10-CM | POA: Diagnosis not present

## 2021-07-13 DIAGNOSIS — R262 Difficulty in walking, not elsewhere classified: Secondary | ICD-10-CM | POA: Diagnosis not present

## 2021-07-13 DIAGNOSIS — J301 Allergic rhinitis due to pollen: Secondary | ICD-10-CM | POA: Diagnosis not present

## 2021-07-13 DIAGNOSIS — R2689 Other abnormalities of gait and mobility: Secondary | ICD-10-CM | POA: Diagnosis not present

## 2021-07-13 DIAGNOSIS — R293 Abnormal posture: Secondary | ICD-10-CM | POA: Diagnosis not present

## 2021-07-17 DIAGNOSIS — R293 Abnormal posture: Secondary | ICD-10-CM | POA: Diagnosis not present

## 2021-07-17 DIAGNOSIS — R262 Difficulty in walking, not elsewhere classified: Secondary | ICD-10-CM | POA: Diagnosis not present

## 2021-07-17 DIAGNOSIS — M542 Cervicalgia: Secondary | ICD-10-CM | POA: Diagnosis not present

## 2021-07-17 DIAGNOSIS — R2689 Other abnormalities of gait and mobility: Secondary | ICD-10-CM | POA: Diagnosis not present

## 2021-07-17 DIAGNOSIS — M5459 Other low back pain: Secondary | ICD-10-CM | POA: Diagnosis not present

## 2021-07-19 DIAGNOSIS — M5459 Other low back pain: Secondary | ICD-10-CM | POA: Diagnosis not present

## 2021-07-19 DIAGNOSIS — R262 Difficulty in walking, not elsewhere classified: Secondary | ICD-10-CM | POA: Diagnosis not present

## 2021-07-19 DIAGNOSIS — M542 Cervicalgia: Secondary | ICD-10-CM | POA: Diagnosis not present

## 2021-07-19 DIAGNOSIS — R2689 Other abnormalities of gait and mobility: Secondary | ICD-10-CM | POA: Diagnosis not present

## 2021-07-19 DIAGNOSIS — R293 Abnormal posture: Secondary | ICD-10-CM | POA: Diagnosis not present

## 2021-07-23 DIAGNOSIS — M542 Cervicalgia: Secondary | ICD-10-CM | POA: Diagnosis not present

## 2021-07-23 DIAGNOSIS — R2689 Other abnormalities of gait and mobility: Secondary | ICD-10-CM | POA: Diagnosis not present

## 2021-07-23 DIAGNOSIS — R293 Abnormal posture: Secondary | ICD-10-CM | POA: Diagnosis not present

## 2021-07-23 DIAGNOSIS — R262 Difficulty in walking, not elsewhere classified: Secondary | ICD-10-CM | POA: Diagnosis not present

## 2021-07-23 DIAGNOSIS — M5459 Other low back pain: Secondary | ICD-10-CM | POA: Diagnosis not present

## 2021-07-26 DIAGNOSIS — M542 Cervicalgia: Secondary | ICD-10-CM | POA: Diagnosis not present

## 2021-07-26 DIAGNOSIS — R262 Difficulty in walking, not elsewhere classified: Secondary | ICD-10-CM | POA: Diagnosis not present

## 2021-07-26 DIAGNOSIS — R2689 Other abnormalities of gait and mobility: Secondary | ICD-10-CM | POA: Diagnosis not present

## 2021-07-26 DIAGNOSIS — R293 Abnormal posture: Secondary | ICD-10-CM | POA: Diagnosis not present

## 2021-07-26 DIAGNOSIS — M5459 Other low back pain: Secondary | ICD-10-CM | POA: Diagnosis not present

## 2021-07-27 DIAGNOSIS — J301 Allergic rhinitis due to pollen: Secondary | ICD-10-CM | POA: Diagnosis not present

## 2021-07-30 DIAGNOSIS — J301 Allergic rhinitis due to pollen: Secondary | ICD-10-CM | POA: Diagnosis not present

## 2021-08-01 DIAGNOSIS — M9901 Segmental and somatic dysfunction of cervical region: Secondary | ICD-10-CM | POA: Diagnosis not present

## 2021-08-01 DIAGNOSIS — M9905 Segmental and somatic dysfunction of pelvic region: Secondary | ICD-10-CM | POA: Diagnosis not present

## 2021-08-01 DIAGNOSIS — M9903 Segmental and somatic dysfunction of lumbar region: Secondary | ICD-10-CM | POA: Diagnosis not present

## 2021-08-01 DIAGNOSIS — M9904 Segmental and somatic dysfunction of sacral region: Secondary | ICD-10-CM | POA: Diagnosis not present

## 2021-08-01 DIAGNOSIS — Z0289 Encounter for other administrative examinations: Secondary | ICD-10-CM | POA: Diagnosis not present

## 2021-08-02 DIAGNOSIS — Z79899 Other long term (current) drug therapy: Secondary | ICD-10-CM | POA: Diagnosis not present

## 2021-08-02 DIAGNOSIS — M1A0790 Idiopathic chronic gout, unspecified ankle and foot, without tophus (tophi): Secondary | ICD-10-CM | POA: Diagnosis not present

## 2021-08-02 DIAGNOSIS — M159 Polyosteoarthritis, unspecified: Secondary | ICD-10-CM | POA: Diagnosis not present

## 2021-08-03 DIAGNOSIS — J301 Allergic rhinitis due to pollen: Secondary | ICD-10-CM | POA: Diagnosis not present

## 2021-08-17 DIAGNOSIS — J301 Allergic rhinitis due to pollen: Secondary | ICD-10-CM | POA: Diagnosis not present

## 2021-08-24 DIAGNOSIS — J301 Allergic rhinitis due to pollen: Secondary | ICD-10-CM | POA: Diagnosis not present

## 2021-08-28 DIAGNOSIS — J449 Chronic obstructive pulmonary disease, unspecified: Secondary | ICD-10-CM | POA: Diagnosis not present

## 2021-08-28 DIAGNOSIS — Z794 Long term (current) use of insulin: Secondary | ICD-10-CM | POA: Diagnosis not present

## 2021-08-28 DIAGNOSIS — I10 Essential (primary) hypertension: Secondary | ICD-10-CM | POA: Diagnosis not present

## 2021-08-28 DIAGNOSIS — E1159 Type 2 diabetes mellitus with other circulatory complications: Secondary | ICD-10-CM | POA: Diagnosis not present

## 2021-08-29 DIAGNOSIS — M9904 Segmental and somatic dysfunction of sacral region: Secondary | ICD-10-CM | POA: Diagnosis not present

## 2021-08-29 DIAGNOSIS — M9901 Segmental and somatic dysfunction of cervical region: Secondary | ICD-10-CM | POA: Diagnosis not present

## 2021-08-29 DIAGNOSIS — M9905 Segmental and somatic dysfunction of pelvic region: Secondary | ICD-10-CM | POA: Diagnosis not present

## 2021-08-29 DIAGNOSIS — M9903 Segmental and somatic dysfunction of lumbar region: Secondary | ICD-10-CM | POA: Diagnosis not present

## 2021-08-30 DIAGNOSIS — J449 Chronic obstructive pulmonary disease, unspecified: Secondary | ICD-10-CM | POA: Diagnosis not present

## 2021-08-31 DIAGNOSIS — J301 Allergic rhinitis due to pollen: Secondary | ICD-10-CM | POA: Diagnosis not present

## 2021-09-14 DIAGNOSIS — J301 Allergic rhinitis due to pollen: Secondary | ICD-10-CM | POA: Diagnosis not present

## 2021-09-21 DIAGNOSIS — J301 Allergic rhinitis due to pollen: Secondary | ICD-10-CM | POA: Diagnosis not present

## 2021-09-25 DIAGNOSIS — J301 Allergic rhinitis due to pollen: Secondary | ICD-10-CM | POA: Diagnosis not present

## 2021-09-26 DIAGNOSIS — J301 Allergic rhinitis due to pollen: Secondary | ICD-10-CM | POA: Diagnosis not present

## 2021-09-28 DIAGNOSIS — J301 Allergic rhinitis due to pollen: Secondary | ICD-10-CM | POA: Diagnosis not present

## 2021-10-03 DIAGNOSIS — M9905 Segmental and somatic dysfunction of pelvic region: Secondary | ICD-10-CM | POA: Diagnosis not present

## 2021-10-03 DIAGNOSIS — M9904 Segmental and somatic dysfunction of sacral region: Secondary | ICD-10-CM | POA: Diagnosis not present

## 2021-10-03 DIAGNOSIS — M9903 Segmental and somatic dysfunction of lumbar region: Secondary | ICD-10-CM | POA: Diagnosis not present

## 2021-10-03 DIAGNOSIS — M9901 Segmental and somatic dysfunction of cervical region: Secondary | ICD-10-CM | POA: Diagnosis not present

## 2021-10-05 DIAGNOSIS — J301 Allergic rhinitis due to pollen: Secondary | ICD-10-CM | POA: Diagnosis not present

## 2021-10-12 DIAGNOSIS — J301 Allergic rhinitis due to pollen: Secondary | ICD-10-CM | POA: Diagnosis not present

## 2021-10-19 DIAGNOSIS — J301 Allergic rhinitis due to pollen: Secondary | ICD-10-CM | POA: Diagnosis not present

## 2021-10-26 DIAGNOSIS — J301 Allergic rhinitis due to pollen: Secondary | ICD-10-CM | POA: Diagnosis not present

## 2021-10-31 DIAGNOSIS — M9905 Segmental and somatic dysfunction of pelvic region: Secondary | ICD-10-CM | POA: Diagnosis not present

## 2021-10-31 DIAGNOSIS — M9904 Segmental and somatic dysfunction of sacral region: Secondary | ICD-10-CM | POA: Diagnosis not present

## 2021-10-31 DIAGNOSIS — M9901 Segmental and somatic dysfunction of cervical region: Secondary | ICD-10-CM | POA: Diagnosis not present

## 2021-10-31 DIAGNOSIS — M9903 Segmental and somatic dysfunction of lumbar region: Secondary | ICD-10-CM | POA: Diagnosis not present

## 2021-11-30 ENCOUNTER — Other Ambulatory Visit (HOSPITAL_COMMUNITY): Payer: Self-pay

## 2021-11-30 MED ORDER — OZEMPIC (0.25 OR 0.5 MG/DOSE) 2 MG/3ML ~~LOC~~ SOPN
PEN_INJECTOR | SUBCUTANEOUS | 1 refills | Status: AC
  Filled 2021-11-30: qty 3, 42d supply, fill #0
  Filled 2022-01-04: qty 3, 28d supply, fill #1
  Filled 2022-02-11: qty 3, fill #2
  Filled 2022-02-11: qty 3, 28d supply, fill #2

## 2021-12-03 ENCOUNTER — Other Ambulatory Visit (HOSPITAL_COMMUNITY): Payer: Self-pay

## 2021-12-17 ENCOUNTER — Other Ambulatory Visit (HOSPITAL_COMMUNITY): Payer: Self-pay

## 2022-01-04 ENCOUNTER — Other Ambulatory Visit: Payer: Self-pay

## 2022-01-04 ENCOUNTER — Other Ambulatory Visit (HOSPITAL_COMMUNITY): Payer: Self-pay

## 2022-01-07 ENCOUNTER — Other Ambulatory Visit (HOSPITAL_COMMUNITY): Payer: Self-pay

## 2022-01-22 ENCOUNTER — Other Ambulatory Visit: Payer: Self-pay | Admitting: *Deleted

## 2022-01-22 DIAGNOSIS — N401 Enlarged prostate with lower urinary tract symptoms: Secondary | ICD-10-CM

## 2022-01-25 DIAGNOSIS — J301 Allergic rhinitis due to pollen: Secondary | ICD-10-CM | POA: Diagnosis not present

## 2022-02-01 DIAGNOSIS — J301 Allergic rhinitis due to pollen: Secondary | ICD-10-CM | POA: Diagnosis not present

## 2022-02-06 DIAGNOSIS — M9903 Segmental and somatic dysfunction of lumbar region: Secondary | ICD-10-CM | POA: Diagnosis not present

## 2022-02-06 DIAGNOSIS — M9901 Segmental and somatic dysfunction of cervical region: Secondary | ICD-10-CM | POA: Diagnosis not present

## 2022-02-06 DIAGNOSIS — M9905 Segmental and somatic dysfunction of pelvic region: Secondary | ICD-10-CM | POA: Diagnosis not present

## 2022-02-06 DIAGNOSIS — M9904 Segmental and somatic dysfunction of sacral region: Secondary | ICD-10-CM | POA: Diagnosis not present

## 2022-02-07 ENCOUNTER — Other Ambulatory Visit: Payer: HMO

## 2022-02-07 ENCOUNTER — Encounter: Payer: Self-pay | Admitting: Urology

## 2022-02-08 ENCOUNTER — Encounter: Payer: Self-pay | Admitting: Urology

## 2022-02-08 ENCOUNTER — Ambulatory Visit: Payer: HMO | Admitting: Urology

## 2022-02-08 DIAGNOSIS — Z79899 Other long term (current) drug therapy: Secondary | ICD-10-CM | POA: Diagnosis not present

## 2022-02-08 DIAGNOSIS — J301 Allergic rhinitis due to pollen: Secondary | ICD-10-CM | POA: Diagnosis not present

## 2022-02-08 DIAGNOSIS — M1A0790 Idiopathic chronic gout, unspecified ankle and foot, without tophus (tophi): Secondary | ICD-10-CM | POA: Diagnosis not present

## 2022-02-11 ENCOUNTER — Other Ambulatory Visit (HOSPITAL_COMMUNITY): Payer: Self-pay

## 2022-02-15 DIAGNOSIS — J301 Allergic rhinitis due to pollen: Secondary | ICD-10-CM | POA: Diagnosis not present

## 2022-02-18 DIAGNOSIS — Z6836 Body mass index (BMI) 36.0-36.9, adult: Secondary | ICD-10-CM | POA: Diagnosis not present

## 2022-02-18 DIAGNOSIS — E669 Obesity, unspecified: Secondary | ICD-10-CM | POA: Diagnosis not present

## 2022-02-22 DIAGNOSIS — J301 Allergic rhinitis due to pollen: Secondary | ICD-10-CM | POA: Diagnosis not present

## 2022-02-28 DIAGNOSIS — E1122 Type 2 diabetes mellitus with diabetic chronic kidney disease: Secondary | ICD-10-CM | POA: Diagnosis not present

## 2022-02-28 DIAGNOSIS — M1A0720 Idiopathic chronic gout, left ankle and foot, without tophus (tophi): Secondary | ICD-10-CM | POA: Diagnosis not present

## 2022-02-28 DIAGNOSIS — E039 Hypothyroidism, unspecified: Secondary | ICD-10-CM | POA: Diagnosis not present

## 2022-02-28 DIAGNOSIS — K219 Gastro-esophageal reflux disease without esophagitis: Secondary | ICD-10-CM | POA: Diagnosis not present

## 2022-02-28 DIAGNOSIS — I10 Essential (primary) hypertension: Secondary | ICD-10-CM | POA: Diagnosis not present

## 2022-02-28 DIAGNOSIS — N1831 Chronic kidney disease, stage 3a: Secondary | ICD-10-CM | POA: Diagnosis not present

## 2022-02-28 DIAGNOSIS — F418 Other specified anxiety disorders: Secondary | ICD-10-CM | POA: Diagnosis not present

## 2022-02-28 DIAGNOSIS — Z79899 Other long term (current) drug therapy: Secondary | ICD-10-CM | POA: Diagnosis not present

## 2022-02-28 DIAGNOSIS — Z794 Long term (current) use of insulin: Secondary | ICD-10-CM | POA: Diagnosis not present

## 2022-02-28 DIAGNOSIS — E782 Mixed hyperlipidemia: Secondary | ICD-10-CM | POA: Diagnosis not present

## 2022-02-28 DIAGNOSIS — E538 Deficiency of other specified B group vitamins: Secondary | ICD-10-CM | POA: Diagnosis not present

## 2022-02-28 DIAGNOSIS — I700 Atherosclerosis of aorta: Secondary | ICD-10-CM | POA: Diagnosis not present

## 2022-02-28 DIAGNOSIS — J449 Chronic obstructive pulmonary disease, unspecified: Secondary | ICD-10-CM | POA: Diagnosis not present

## 2022-03-01 ENCOUNTER — Other Ambulatory Visit (HOSPITAL_COMMUNITY): Payer: Self-pay

## 2022-03-01 ENCOUNTER — Other Ambulatory Visit: Payer: Self-pay

## 2022-03-01 DIAGNOSIS — J301 Allergic rhinitis due to pollen: Secondary | ICD-10-CM | POA: Diagnosis not present

## 2022-03-01 MED ORDER — OZEMPIC (1 MG/DOSE) 4 MG/3ML ~~LOC~~ SOPN
1.0000 mg | PEN_INJECTOR | SUBCUTANEOUS | 1 refills | Status: DC
  Filled 2022-03-01 (×2): qty 9, 84d supply, fill #0
  Filled 2022-03-11: qty 3, 28d supply, fill #0

## 2022-03-05 ENCOUNTER — Other Ambulatory Visit: Payer: Self-pay

## 2022-03-06 DIAGNOSIS — E1122 Type 2 diabetes mellitus with diabetic chronic kidney disease: Secondary | ICD-10-CM | POA: Diagnosis not present

## 2022-03-06 DIAGNOSIS — R051 Acute cough: Secondary | ICD-10-CM | POA: Diagnosis not present

## 2022-03-06 DIAGNOSIS — J069 Acute upper respiratory infection, unspecified: Secondary | ICD-10-CM | POA: Diagnosis not present

## 2022-03-06 DIAGNOSIS — Z794 Long term (current) use of insulin: Secondary | ICD-10-CM | POA: Diagnosis not present

## 2022-03-06 DIAGNOSIS — N1831 Chronic kidney disease, stage 3a: Secondary | ICD-10-CM | POA: Diagnosis not present

## 2022-03-06 DIAGNOSIS — J301 Allergic rhinitis due to pollen: Secondary | ICD-10-CM | POA: Diagnosis not present

## 2022-03-08 DIAGNOSIS — J301 Allergic rhinitis due to pollen: Secondary | ICD-10-CM | POA: Diagnosis not present

## 2022-03-11 ENCOUNTER — Other Ambulatory Visit (HOSPITAL_COMMUNITY): Payer: Self-pay

## 2022-03-15 DIAGNOSIS — J301 Allergic rhinitis due to pollen: Secondary | ICD-10-CM | POA: Diagnosis not present

## 2022-03-20 DIAGNOSIS — Z6834 Body mass index (BMI) 34.0-34.9, adult: Secondary | ICD-10-CM | POA: Diagnosis not present

## 2022-03-20 DIAGNOSIS — E669 Obesity, unspecified: Secondary | ICD-10-CM | POA: Diagnosis not present

## 2022-03-22 DIAGNOSIS — J301 Allergic rhinitis due to pollen: Secondary | ICD-10-CM | POA: Diagnosis not present

## 2022-03-29 DIAGNOSIS — J301 Allergic rhinitis due to pollen: Secondary | ICD-10-CM | POA: Diagnosis not present

## 2022-04-04 DIAGNOSIS — M9903 Segmental and somatic dysfunction of lumbar region: Secondary | ICD-10-CM | POA: Diagnosis not present

## 2022-04-04 DIAGNOSIS — M9905 Segmental and somatic dysfunction of pelvic region: Secondary | ICD-10-CM | POA: Diagnosis not present

## 2022-04-04 DIAGNOSIS — M9904 Segmental and somatic dysfunction of sacral region: Secondary | ICD-10-CM | POA: Diagnosis not present

## 2022-04-04 DIAGNOSIS — M9901 Segmental and somatic dysfunction of cervical region: Secondary | ICD-10-CM | POA: Diagnosis not present

## 2022-04-05 DIAGNOSIS — J301 Allergic rhinitis due to pollen: Secondary | ICD-10-CM | POA: Diagnosis not present

## 2022-04-12 DIAGNOSIS — J301 Allergic rhinitis due to pollen: Secondary | ICD-10-CM | POA: Diagnosis not present

## 2022-04-21 DIAGNOSIS — Z6835 Body mass index (BMI) 35.0-35.9, adult: Secondary | ICD-10-CM | POA: Diagnosis not present

## 2022-04-21 DIAGNOSIS — E669 Obesity, unspecified: Secondary | ICD-10-CM | POA: Diagnosis not present

## 2022-04-26 DIAGNOSIS — J301 Allergic rhinitis due to pollen: Secondary | ICD-10-CM | POA: Diagnosis not present

## 2022-05-08 DIAGNOSIS — M9905 Segmental and somatic dysfunction of pelvic region: Secondary | ICD-10-CM | POA: Diagnosis not present

## 2022-05-08 DIAGNOSIS — M9901 Segmental and somatic dysfunction of cervical region: Secondary | ICD-10-CM | POA: Diagnosis not present

## 2022-05-08 DIAGNOSIS — M9903 Segmental and somatic dysfunction of lumbar region: Secondary | ICD-10-CM | POA: Diagnosis not present

## 2022-05-08 DIAGNOSIS — M9904 Segmental and somatic dysfunction of sacral region: Secondary | ICD-10-CM | POA: Diagnosis not present

## 2022-05-10 DIAGNOSIS — J301 Allergic rhinitis due to pollen: Secondary | ICD-10-CM | POA: Diagnosis not present

## 2022-05-21 DIAGNOSIS — Z6834 Body mass index (BMI) 34.0-34.9, adult: Secondary | ICD-10-CM | POA: Diagnosis not present

## 2022-05-21 DIAGNOSIS — E669 Obesity, unspecified: Secondary | ICD-10-CM | POA: Diagnosis not present

## 2022-05-22 DIAGNOSIS — J301 Allergic rhinitis due to pollen: Secondary | ICD-10-CM | POA: Diagnosis not present

## 2022-05-24 DIAGNOSIS — J301 Allergic rhinitis due to pollen: Secondary | ICD-10-CM | POA: Diagnosis not present

## 2022-05-29 DIAGNOSIS — J449 Chronic obstructive pulmonary disease, unspecified: Secondary | ICD-10-CM | POA: Diagnosis not present

## 2022-05-31 DIAGNOSIS — J301 Allergic rhinitis due to pollen: Secondary | ICD-10-CM | POA: Diagnosis not present

## 2022-06-04 DIAGNOSIS — I10 Essential (primary) hypertension: Secondary | ICD-10-CM | POA: Diagnosis not present

## 2022-06-04 DIAGNOSIS — Z79899 Other long term (current) drug therapy: Secondary | ICD-10-CM | POA: Diagnosis not present

## 2022-06-04 DIAGNOSIS — E1122 Type 2 diabetes mellitus with diabetic chronic kidney disease: Secondary | ICD-10-CM | POA: Diagnosis not present

## 2022-06-04 DIAGNOSIS — M1A0720 Idiopathic chronic gout, left ankle and foot, without tophus (tophi): Secondary | ICD-10-CM | POA: Diagnosis not present

## 2022-06-04 DIAGNOSIS — N1831 Chronic kidney disease, stage 3a: Secondary | ICD-10-CM | POA: Diagnosis not present

## 2022-06-04 DIAGNOSIS — E039 Hypothyroidism, unspecified: Secondary | ICD-10-CM | POA: Diagnosis not present

## 2022-06-04 DIAGNOSIS — F418 Other specified anxiety disorders: Secondary | ICD-10-CM | POA: Diagnosis not present

## 2022-06-04 DIAGNOSIS — I700 Atherosclerosis of aorta: Secondary | ICD-10-CM | POA: Diagnosis not present

## 2022-06-04 DIAGNOSIS — K219 Gastro-esophageal reflux disease without esophagitis: Secondary | ICD-10-CM | POA: Diagnosis not present

## 2022-06-04 DIAGNOSIS — Z794 Long term (current) use of insulin: Secondary | ICD-10-CM | POA: Diagnosis not present

## 2022-06-04 DIAGNOSIS — E782 Mixed hyperlipidemia: Secondary | ICD-10-CM | POA: Diagnosis not present

## 2022-06-04 DIAGNOSIS — E538 Deficiency of other specified B group vitamins: Secondary | ICD-10-CM | POA: Diagnosis not present

## 2022-06-04 DIAGNOSIS — J449 Chronic obstructive pulmonary disease, unspecified: Secondary | ICD-10-CM | POA: Diagnosis not present

## 2022-06-05 DIAGNOSIS — M9904 Segmental and somatic dysfunction of sacral region: Secondary | ICD-10-CM | POA: Diagnosis not present

## 2022-06-05 DIAGNOSIS — M9905 Segmental and somatic dysfunction of pelvic region: Secondary | ICD-10-CM | POA: Diagnosis not present

## 2022-06-05 DIAGNOSIS — M9901 Segmental and somatic dysfunction of cervical region: Secondary | ICD-10-CM | POA: Diagnosis not present

## 2022-06-05 DIAGNOSIS — M9903 Segmental and somatic dysfunction of lumbar region: Secondary | ICD-10-CM | POA: Diagnosis not present

## 2022-06-07 DIAGNOSIS — J301 Allergic rhinitis due to pollen: Secondary | ICD-10-CM | POA: Diagnosis not present

## 2022-06-14 DIAGNOSIS — J301 Allergic rhinitis due to pollen: Secondary | ICD-10-CM | POA: Diagnosis not present

## 2022-06-21 DIAGNOSIS — Z6834 Body mass index (BMI) 34.0-34.9, adult: Secondary | ICD-10-CM | POA: Diagnosis not present

## 2022-06-21 DIAGNOSIS — E669 Obesity, unspecified: Secondary | ICD-10-CM | POA: Diagnosis not present

## 2022-06-24 DIAGNOSIS — Z794 Long term (current) use of insulin: Secondary | ICD-10-CM | POA: Diagnosis not present

## 2022-06-24 DIAGNOSIS — B351 Tinea unguium: Secondary | ICD-10-CM | POA: Diagnosis not present

## 2022-06-24 DIAGNOSIS — E1142 Type 2 diabetes mellitus with diabetic polyneuropathy: Secondary | ICD-10-CM | POA: Diagnosis not present

## 2022-06-24 DIAGNOSIS — M722 Plantar fascial fibromatosis: Secondary | ICD-10-CM | POA: Diagnosis not present

## 2022-06-28 DIAGNOSIS — J301 Allergic rhinitis due to pollen: Secondary | ICD-10-CM | POA: Diagnosis not present

## 2022-07-02 ENCOUNTER — Other Ambulatory Visit: Payer: Self-pay | Admitting: Urology

## 2022-07-04 ENCOUNTER — Ambulatory Visit: Payer: PPO | Admitting: Urology

## 2022-07-04 ENCOUNTER — Encounter: Payer: Self-pay | Admitting: Urology

## 2022-07-04 VITALS — BP 145/70 | HR 105 | Ht 70.0 in | Wt 234.0 lb

## 2022-07-04 DIAGNOSIS — N401 Enlarged prostate with lower urinary tract symptoms: Secondary | ICD-10-CM | POA: Diagnosis not present

## 2022-07-04 DIAGNOSIS — N528 Other male erectile dysfunction: Secondary | ICD-10-CM

## 2022-07-04 LAB — URINALYSIS, COMPLETE
Bilirubin, UA: NEGATIVE
Ketones, UA: NEGATIVE
Leukocytes,UA: NEGATIVE
Nitrite, UA: NEGATIVE
Protein,UA: NEGATIVE
RBC, UA: NEGATIVE
Specific Gravity, UA: 1.015 (ref 1.005–1.030)
Urobilinogen, Ur: 0.2 mg/dL (ref 0.2–1.0)
pH, UA: 6.5 (ref 5.0–7.5)

## 2022-07-04 LAB — MICROSCOPIC EXAMINATION: Bacteria, UA: NONE SEEN

## 2022-07-04 MED ORDER — TAMSULOSIN HCL 0.4 MG PO CAPS: 0.4000 mg | ORAL_CAPSULE | Freq: Every day | ORAL | 3 refills | Status: DC

## 2022-07-04 MED ORDER — GEMTESA 75 MG PO TABS: 75.0000 mg | ORAL_TABLET | Freq: Every day | ORAL | 0 refills | Status: DC

## 2022-07-04 NOTE — Progress Notes (Signed)
I,Nicholas Humphrey,acting as a Neurosurgeon for Nicholas Altes, MD.,have documented all relevant documentation on the behalf of Nicholas Altes, MD,as directed by  Nicholas Altes, MD while in the presence of Nicholas Altes, MD.  07/04/2022 1:45 PM   Nicholas Humphrey 1950/07/11 [id number]  Referring provider: Myrene Buddy, NP [address] [place],  [place] [address]  Chief Complaint  Patient presents with   Follow-up    HPI: Nicholas Humphrey is a 72 y.o. male who presents for a follow up visit.  Last seen on 02/02/2021. At that visit, he had a renal cyst, incidentally noted on an lumbar MRI. He also was complaining of erectile dysfunction and was given a trial of sildenafil He subsequently underwent a renal ultrasound, which showed right simple renal cysts x2 that were present on a prior CT from 2018.  Since his last visit, he has noted increased urinary urgency. Over the last few months, he estimates that every 1-2 hours, he will get an intense urge to void. Sometimes, he will void only a minimal amount. However, at other times, a normal amount of urine. His wife removed a tick from his scrotum last week and he has had no sequela At last visit, he was given a trial of sildenafil for his ED and did not see any significant improvement in his symptoms He has penile adhesions, and intermittently applies a topical steroid for 5 days at a time with improvement  PMH: Past Medical History:  Diagnosis Date   Diabetes mellitus without complication ([facility])    Diabetic neuropathy ([facility])    Fall from ladder 2018   LANDED ON CONCRETE   GERD (gastroesophageal reflux disease)    Hypercholesteremia    Hypertension    Hypothyroidism    PONV (postoperative nausea and vomiting)    Wears contact lenses     Surgical History: Past Surgical History:  Procedure Laterality Date   APPENDECTOMY     BACK SURGERY  2019   CARPAL TUNNEL RELEASE Right 08/18/2019   Procedure: ENDOSCOPIC RIGHT CARPAL  TUNNEL RELEASE , RELEASE RIGHT LONG TRIGGER FINGER. INDEX TRIGGER AND TRIGGER THUMB RELEASE;  Surgeon: Christena Flake, MD;  Location: [facility] ORS;  Service: Orthopedics;  Laterality: Right;   COLONOSCOPY WITH PROPOFOL N/A 03/30/2021   Procedure: COLONOSCOPY WITH PROPOFOL;  Surgeon: Regis Bill, MD;  Location: [facility] ENDOSCOPY;  Service: Endoscopy;  Laterality: N/A;  IDDM   ELBOW SURGERY Left 2018   X2   FRACTURE SURGERY Left    SEVERAL BROKEN BONES AFTER FALL   HAND SURGERY Left 2018   HARDWARE REMOVED   HAND SURGERY Left 2018   HARDWARE AFTER FALL   INGUINAL HERNIA REPAIR Right    KNEE ARTHROSCOPY W/ PARTIAL MEDIAL MENISCECTOMY Left 08/17/2010   LUMBAR LAMINECTOMY     ROTATOR CUFF REPAIR Right 2013   TRIGGER FINGER RELEASE Right 10/04/2015   Procedure: RELEASE TRIGGER FINGER/A-1 PULLEY;  Surgeon: Christena Flake, MD;  Location: [facility];  Service: Orthopedics;  Laterality: Right;  PREFER BIER BLOCK Diabetic - insulin and oral meds   TYMPANOPLASTY Right     Home Medications:  Allergies as of 07/04/2022       Reactions   Pregabalin Swelling   Legs and ankles   Oxycodone Nausea And Vomiting   Other reaction(s): Hallucinations, Vomiting   Crestor [rosuvastatin]    Muscle pain   Doxycycline Nausea Only   Lipitor [atorvastatin]    Muscle pain  Tramadol Anxiety   Other reaction(s): Hallucinations        Medication List        Accurate as of July 04, 2022  1:45 PM. If you have any questions, ask your nurse or doctor.          allopurinol 100 MG tablet Commonly known as: ZYLOPRIM Take 100 mg by mouth daily.   aspirin 81 MG tablet Take 81 mg by mouth daily.   colchicine 0.6 MG tablet   ELDERBERRY PO Take 1 capsule by mouth daily.   EPINEPHrine 0.3 mg/0.3 mL Soaj injection Commonly known as: EPI-PEN Inject into the muscle.   fluticasone-salmeterol 250-50 MCG/ACT Aepb Commonly known as: ADVAIR Inhale 1 puff into the lungs 2 (two) times daily.    FreeStyle Libre 2 Sensor Misc   gabapentin 300 MG capsule Commonly known as: NEURONTIN Take 1 capsule by mouth 3 (three) times daily.   insulin degludec 200 UNIT/ML FlexTouch Pen Commonly known as: TRESIBA Inject 124 Units into the skin daily.   levothyroxine 137 MCG tablet Commonly known as: SYNTHROID Take 137 mcg by mouth daily.   loratadine 10 MG tablet Commonly known as: CLARITIN Take 10 mg by mouth daily.   LORazepam 0.5 MG tablet Commonly known as: ATIVAN Ativan 0.5 mg tablet  Take 1 tablet 45 minutes prior to procedure. May repeat after 30 minutes prn.MUST BRING A DRIVER FOR PROCEDURE.   losartan-hydrochlorothiazide 50-12.5 MG tablet Commonly known as: HYZAAR Take 1 tablet by mouth daily.   magnesium oxide 400 MG tablet Commonly known as: MAG-OX Take 400 mg by mouth daily.   metFORMIN 1000 MG tablet Commonly known as: GLUCOPHAGE Take 1,000-1,500 mg by mouth See admin instructions. 1000 mg in AM. 1500 mg in PM.   methocarbamol 500 MG tablet Commonly known as: ROBAXIN Take 500 mg by mouth 2 (two) times daily.   montelukast 10 MG tablet Commonly known as: SINGULAIR Take 10 mg by mouth at bedtime.   multivitamin tablet Take 1 tablet by mouth daily.   naproxen 500 MG tablet Commonly known as: NAPROSYN Take 500 mg by mouth 2 (two) times daily.   NovoLOG FlexPen 100 UNIT/ML FlexPen Generic drug: insulin aspart Novolog FlexPen U-100 Insulin   Ozempic (1 MG/DOSE) 4 MG/3ML Sopn Generic drug: Semaglutide (1 MG/DOSE) Inject 1 mg into the skin once a week.   pantoprazole 40 MG tablet Commonly known as: PROTONIX Take 40 mg by mouth 2 (two) times daily.   PARoxetine 20 MG tablet Commonly known as: PAXIL Take 20 mg by mouth daily.   sildenafil 100 MG tablet Commonly known as: VIAGRA Take 1 tab 1 hour prior to intercourse   simvastatin 40 MG tablet Commonly known as: ZOCOR Take 40 mg by mouth at bedtime.   tamsulosin 0.4 MG Caps capsule Commonly  known as: FLOMAX Take 1 capsule (0.4 mg total) by mouth daily.   vitamin B-12 500 MCG tablet Commonly known as: CYANOCOBALAMIN Take 500 mcg by mouth daily.        Allergies:  Allergies  Allergen Reactions   Pregabalin Swelling    Legs and ankles   Oxycodone Nausea And Vomiting    Other reaction(s): Hallucinations, Vomiting   Crestor [Rosuvastatin]     Muscle pain   Doxycycline Nausea Only   Lipitor [Atorvastatin]     Muscle pain   Tramadol Anxiety    Other reaction(s): Hallucinations    Family History: Family History  Problem Relation Age of Onset   Prostate cancer Father  Bladder Cancer Neg Hx    Kidney cancer Neg Hx     Social History:  reports that he has never smoked. He has never used smokeless tobacco. He reports that he does not drink alcohol and does not use drugs.   Physical Exam: BP (!) 145/70   Pulse (!) 105   Ht 5\' 10"  (1.778 m)   Wt 234 lb (106.1 kg)   BMI 33.58 kg/m   Constitutional:  Alert and oriented, No acute distress. HEENT: [place] AT Respiratory: Normal respiratory effort, no increased work of breathing. GU: Phallus without lesions, circumferential adhesions of penile skin over the corona. One area where the corona is visible and slightly erythematous. The site of the scrotal tick shows no erythema, redness, or drainage  Laboratory:  Urinalysis-dipstick/microscopy negative  Assessment & Plan:    1. BPH with LUTS Continue tamsulosin, refill sent to pharmacy P[facility] Add Gemtesa 75 mg daily; samples given; call back regarding efficacy  2. Erectile dysfunction We discussed the efficacy of tadalafil and sildenafil are equal though I do have patients that feel one works better than the other. He was offered a trial of tadalafil and he will talk it over with his wife and will call back if desired Rx  Continue annual follow up  I have reviewed the above documentation for accuracy and completeness, and I agree with the above.   Nicholas Altes,  MD  [facility] [address] [place], [place] [address] [phone]

## 2022-07-10 DIAGNOSIS — M9905 Segmental and somatic dysfunction of pelvic region: Secondary | ICD-10-CM | POA: Diagnosis not present

## 2022-07-10 DIAGNOSIS — M9904 Segmental and somatic dysfunction of sacral region: Secondary | ICD-10-CM | POA: Diagnosis not present

## 2022-07-10 DIAGNOSIS — M9903 Segmental and somatic dysfunction of lumbar region: Secondary | ICD-10-CM | POA: Diagnosis not present

## 2022-07-10 DIAGNOSIS — M9901 Segmental and somatic dysfunction of cervical region: Secondary | ICD-10-CM | POA: Diagnosis not present

## 2022-07-11 DIAGNOSIS — I10 Essential (primary) hypertension: Secondary | ICD-10-CM | POA: Diagnosis not present

## 2022-07-11 DIAGNOSIS — E1159 Type 2 diabetes mellitus with other circulatory complications: Secondary | ICD-10-CM | POA: Diagnosis not present

## 2022-07-11 DIAGNOSIS — Z794 Long term (current) use of insulin: Secondary | ICD-10-CM | POA: Diagnosis not present

## 2022-07-11 DIAGNOSIS — M489 Spondylopathy, unspecified: Secondary | ICD-10-CM | POA: Diagnosis not present

## 2022-07-12 DIAGNOSIS — E119 Type 2 diabetes mellitus without complications: Secondary | ICD-10-CM | POA: Diagnosis not present

## 2022-07-12 DIAGNOSIS — J301 Allergic rhinitis due to pollen: Secondary | ICD-10-CM | POA: Diagnosis not present

## 2022-07-19 DIAGNOSIS — J301 Allergic rhinitis due to pollen: Secondary | ICD-10-CM | POA: Diagnosis not present

## 2022-07-21 DIAGNOSIS — Z6834 Body mass index (BMI) 34.0-34.9, adult: Secondary | ICD-10-CM | POA: Diagnosis not present

## 2022-07-21 DIAGNOSIS — E669 Obesity, unspecified: Secondary | ICD-10-CM | POA: Diagnosis not present

## 2022-07-30 ENCOUNTER — Other Ambulatory Visit: Payer: Self-pay | Admitting: Family Medicine

## 2022-07-30 MED ORDER — GEMTESA 75 MG PO TABS: 75.0000 mg | ORAL_TABLET | Freq: Every day | ORAL | 2 refills | Status: DC

## 2022-07-31 ENCOUNTER — Telehealth: Payer: Self-pay | Admitting: Urology

## 2022-07-31 NOTE — Telephone Encounter (Signed)
Patient called and stated that Leslye Peer is over $100.00, and would like to see if there is something less expensive that can be prescribed instead. Drugstore is [facility]. Please advise patient.

## 2022-08-02 DIAGNOSIS — J301 Allergic rhinitis due to pollen: Secondary | ICD-10-CM | POA: Diagnosis not present

## 2022-08-02 NOTE — Telephone Encounter (Signed)
Can try Solifenacin 10 mg daily however this medication has more side effects including dry mouth and constipation.  There is also possibility of urinary retention.  If he elects to try this medication recommend a PA follow-up with bladder scan

## 2022-08-02 NOTE — Telephone Encounter (Signed)
Advised patient , patient states he will think about this and call back if he wants to try medication .

## 2022-08-07 DIAGNOSIS — M9903 Segmental and somatic dysfunction of lumbar region: Secondary | ICD-10-CM | POA: Diagnosis not present

## 2022-08-07 DIAGNOSIS — M9904 Segmental and somatic dysfunction of sacral region: Secondary | ICD-10-CM | POA: Diagnosis not present

## 2022-08-07 DIAGNOSIS — M9905 Segmental and somatic dysfunction of pelvic region: Secondary | ICD-10-CM | POA: Diagnosis not present

## 2022-08-07 DIAGNOSIS — M9901 Segmental and somatic dysfunction of cervical region: Secondary | ICD-10-CM | POA: Diagnosis not present

## 2022-08-09 DIAGNOSIS — J301 Allergic rhinitis due to pollen: Secondary | ICD-10-CM | POA: Diagnosis not present

## 2022-08-12 DIAGNOSIS — J301 Allergic rhinitis due to pollen: Secondary | ICD-10-CM | POA: Diagnosis not present

## 2022-08-21 DIAGNOSIS — Z6834 Body mass index (BMI) 34.0-34.9, adult: Secondary | ICD-10-CM | POA: Diagnosis not present

## 2022-08-21 DIAGNOSIS — E669 Obesity, unspecified: Secondary | ICD-10-CM | POA: Diagnosis not present

## 2022-08-22 DIAGNOSIS — M9903 Segmental and somatic dysfunction of lumbar region: Secondary | ICD-10-CM | POA: Diagnosis not present

## 2022-08-22 DIAGNOSIS — M9904 Segmental and somatic dysfunction of sacral region: Secondary | ICD-10-CM | POA: Diagnosis not present

## 2022-08-22 DIAGNOSIS — M9905 Segmental and somatic dysfunction of pelvic region: Secondary | ICD-10-CM | POA: Diagnosis not present

## 2022-08-22 DIAGNOSIS — M9901 Segmental and somatic dysfunction of cervical region: Secondary | ICD-10-CM | POA: Diagnosis not present

## 2022-08-23 DIAGNOSIS — J301 Allergic rhinitis due to pollen: Secondary | ICD-10-CM | POA: Diagnosis not present

## 2022-08-30 DIAGNOSIS — J301 Allergic rhinitis due to pollen: Secondary | ICD-10-CM | POA: Diagnosis not present

## 2022-09-04 DIAGNOSIS — M9905 Segmental and somatic dysfunction of pelvic region: Secondary | ICD-10-CM | POA: Diagnosis not present

## 2022-09-04 DIAGNOSIS — M9903 Segmental and somatic dysfunction of lumbar region: Secondary | ICD-10-CM | POA: Diagnosis not present

## 2022-09-04 DIAGNOSIS — M9901 Segmental and somatic dysfunction of cervical region: Secondary | ICD-10-CM | POA: Diagnosis not present

## 2022-09-04 DIAGNOSIS — M9904 Segmental and somatic dysfunction of sacral region: Secondary | ICD-10-CM | POA: Diagnosis not present

## 2022-09-06 DIAGNOSIS — Z794 Long term (current) use of insulin: Secondary | ICD-10-CM | POA: Diagnosis not present

## 2022-09-06 DIAGNOSIS — F418 Other specified anxiety disorders: Secondary | ICD-10-CM | POA: Diagnosis not present

## 2022-09-06 DIAGNOSIS — N1831 Chronic kidney disease, stage 3a: Secondary | ICD-10-CM | POA: Diagnosis not present

## 2022-09-06 DIAGNOSIS — I10 Essential (primary) hypertension: Secondary | ICD-10-CM | POA: Diagnosis not present

## 2022-09-06 DIAGNOSIS — E1122 Type 2 diabetes mellitus with diabetic chronic kidney disease: Secondary | ICD-10-CM | POA: Diagnosis not present

## 2022-09-06 DIAGNOSIS — E039 Hypothyroidism, unspecified: Secondary | ICD-10-CM | POA: Diagnosis not present

## 2022-09-06 DIAGNOSIS — I700 Atherosclerosis of aorta: Secondary | ICD-10-CM | POA: Diagnosis not present

## 2022-09-06 DIAGNOSIS — M1A0720 Idiopathic chronic gout, left ankle and foot, without tophus (tophi): Secondary | ICD-10-CM | POA: Diagnosis not present

## 2022-09-06 DIAGNOSIS — J301 Allergic rhinitis due to pollen: Secondary | ICD-10-CM | POA: Diagnosis not present

## 2022-09-06 DIAGNOSIS — Z978 Presence of other specified devices: Secondary | ICD-10-CM | POA: Diagnosis not present

## 2022-09-06 DIAGNOSIS — K219 Gastro-esophageal reflux disease without esophagitis: Secondary | ICD-10-CM | POA: Diagnosis not present

## 2022-09-06 DIAGNOSIS — E782 Mixed hyperlipidemia: Secondary | ICD-10-CM | POA: Diagnosis not present

## 2022-09-06 DIAGNOSIS — Z79899 Other long term (current) drug therapy: Secondary | ICD-10-CM | POA: Diagnosis not present

## 2022-09-06 DIAGNOSIS — Z0000 Encounter for general adult medical examination without abnormal findings: Secondary | ICD-10-CM | POA: Diagnosis not present

## 2022-09-06 DIAGNOSIS — E538 Deficiency of other specified B group vitamins: Secondary | ICD-10-CM | POA: Diagnosis not present

## 2022-09-06 DIAGNOSIS — J449 Chronic obstructive pulmonary disease, unspecified: Secondary | ICD-10-CM | POA: Diagnosis not present

## 2022-09-13 DIAGNOSIS — J301 Allergic rhinitis due to pollen: Secondary | ICD-10-CM | POA: Diagnosis not present

## 2022-09-17 DIAGNOSIS — M1A0720 Idiopathic chronic gout, left ankle and foot, without tophus (tophi): Secondary | ICD-10-CM | POA: Diagnosis not present

## 2022-09-17 DIAGNOSIS — Z79899 Other long term (current) drug therapy: Secondary | ICD-10-CM | POA: Diagnosis not present

## 2022-09-21 DIAGNOSIS — E669 Obesity, unspecified: Secondary | ICD-10-CM | POA: Diagnosis not present

## 2022-09-21 DIAGNOSIS — Z6834 Body mass index (BMI) 34.0-34.9, adult: Secondary | ICD-10-CM | POA: Diagnosis not present

## 2022-09-25 DIAGNOSIS — J301 Allergic rhinitis due to pollen: Secondary | ICD-10-CM | POA: Diagnosis not present

## 2022-09-25 DIAGNOSIS — H903 Sensorineural hearing loss, bilateral: Secondary | ICD-10-CM | POA: Diagnosis not present

## 2022-09-26 DIAGNOSIS — M9904 Segmental and somatic dysfunction of sacral region: Secondary | ICD-10-CM | POA: Diagnosis not present

## 2022-09-26 DIAGNOSIS — M9905 Segmental and somatic dysfunction of pelvic region: Secondary | ICD-10-CM | POA: Diagnosis not present

## 2022-09-26 DIAGNOSIS — M9903 Segmental and somatic dysfunction of lumbar region: Secondary | ICD-10-CM | POA: Diagnosis not present

## 2022-09-26 DIAGNOSIS — M9901 Segmental and somatic dysfunction of cervical region: Secondary | ICD-10-CM | POA: Diagnosis not present

## 2022-09-27 DIAGNOSIS — J301 Allergic rhinitis due to pollen: Secondary | ICD-10-CM | POA: Diagnosis not present

## 2022-10-04 DIAGNOSIS — J301 Allergic rhinitis due to pollen: Secondary | ICD-10-CM | POA: Diagnosis not present

## 2022-10-07 DIAGNOSIS — S0501XA Injury of conjunctiva and corneal abrasion without foreign body, right eye, initial encounter: Secondary | ICD-10-CM | POA: Diagnosis not present

## 2022-10-11 DIAGNOSIS — J301 Allergic rhinitis due to pollen: Secondary | ICD-10-CM | POA: Diagnosis not present

## 2022-10-14 DIAGNOSIS — M76822 Posterior tibial tendinitis, left leg: Secondary | ICD-10-CM | POA: Diagnosis not present

## 2022-10-14 DIAGNOSIS — M76829 Posterior tibial tendinitis, unspecified leg: Secondary | ICD-10-CM | POA: Diagnosis not present

## 2022-10-15 ENCOUNTER — Telehealth: Payer: Self-pay | Admitting: Urology

## 2022-10-15 ENCOUNTER — Other Ambulatory Visit: Payer: Self-pay | Admitting: *Deleted

## 2022-10-15 MED ORDER — GEMTESA 75 MG PO TABS: 75.0000 mg | ORAL_TABLET | Freq: Every day | ORAL | 2 refills | Status: DC

## 2022-10-15 NOTE — Telephone Encounter (Signed)
[facility]

## 2022-10-15 NOTE — Telephone Encounter (Signed)
Pt said Gemtesa samples really worked well for him.  He contacted the company and they told him he could get a 90 day supply for $0-$10.  We need to send to:  Fax# [phone]  OR  E-script to:  [facility] [address] [place], [place] [address]  (This has nothing to do with [facility])  Phone# [phone] #2, option 3

## 2022-10-16 ENCOUNTER — Other Ambulatory Visit: Payer: Self-pay | Admitting: *Deleted

## 2022-10-16 ENCOUNTER — Encounter: Payer: Self-pay | Admitting: *Deleted

## 2022-10-16 MED ORDER — GEMTESA 75 MG PO TABS: 75.0000 mg | ORAL_TABLET | Freq: Every day | ORAL | 2 refills | Status: DC

## 2022-10-16 NOTE — Telephone Encounter (Addendum)
Nicholas Humphrey to o [facility] [address] [place], [place] [address]  for patient today. Sent my chart message to patient to let him know .

## 2022-10-16 NOTE — Telephone Encounter (Signed)
Patient called again. He needs for [place] to send a 90 day prescription for [facility] to [facility] [address] [place], [place] [address]. Fax number is [phone]. The one that was sent yesterday was not the correct location.  Please call patient if you have any questions.

## 2022-10-16 NOTE — Telephone Encounter (Signed)
Patient called and lvm asking for Shanda Bumps to call him about his prescription. (He said he had spoken with her yesterday)

## 2022-10-18 DIAGNOSIS — J301 Allergic rhinitis due to pollen: Secondary | ICD-10-CM | POA: Diagnosis not present

## 2022-10-18 DIAGNOSIS — R748 Abnormal levels of other serum enzymes: Secondary | ICD-10-CM | POA: Diagnosis not present

## 2022-10-21 DIAGNOSIS — Z6834 Body mass index (BMI) 34.0-34.9, adult: Secondary | ICD-10-CM | POA: Diagnosis not present

## 2022-10-21 DIAGNOSIS — E669 Obesity, unspecified: Secondary | ICD-10-CM | POA: Diagnosis not present

## 2022-10-22 DIAGNOSIS — M76822 Posterior tibial tendinitis, left leg: Secondary | ICD-10-CM | POA: Diagnosis not present

## 2022-10-22 DIAGNOSIS — M76829 Posterior tibial tendinitis, unspecified leg: Secondary | ICD-10-CM | POA: Diagnosis not present

## 2022-10-25 DIAGNOSIS — J301 Allergic rhinitis due to pollen: Secondary | ICD-10-CM | POA: Diagnosis not present

## 2022-10-31 DIAGNOSIS — J301 Allergic rhinitis due to pollen: Secondary | ICD-10-CM | POA: Diagnosis not present

## 2022-11-01 DIAGNOSIS — J301 Allergic rhinitis due to pollen: Secondary | ICD-10-CM | POA: Diagnosis not present

## 2022-11-08 DIAGNOSIS — J301 Allergic rhinitis due to pollen: Secondary | ICD-10-CM | POA: Diagnosis not present

## 2022-11-15 DIAGNOSIS — J301 Allergic rhinitis due to pollen: Secondary | ICD-10-CM | POA: Diagnosis not present

## 2022-11-21 ENCOUNTER — Telehealth: Payer: Self-pay | Admitting: Urology

## 2022-11-21 NOTE — Telephone Encounter (Signed)
Patient dropped in office to let Shanda Bumps know that prescription for Leslye Peer was filled for 90 days by [facility]. He was approved for assistance with zero cost to him. He would like future refills sent to them.  [facility] [address] [place], [place] [address] Phone # [phone] Fax # [phone]

## 2022-11-29 DIAGNOSIS — J301 Allergic rhinitis due to pollen: Secondary | ICD-10-CM | POA: Diagnosis not present

## 2022-12-03 DIAGNOSIS — J449 Chronic obstructive pulmonary disease, unspecified: Secondary | ICD-10-CM | POA: Diagnosis not present

## 2022-12-03 DIAGNOSIS — Z131 Encounter for screening for diabetes mellitus: Secondary | ICD-10-CM | POA: Diagnosis not present

## 2022-12-06 DIAGNOSIS — J301 Allergic rhinitis due to pollen: Secondary | ICD-10-CM | POA: Diagnosis not present

## 2022-12-13 DIAGNOSIS — Z978 Presence of other specified devices: Secondary | ICD-10-CM | POA: Diagnosis not present

## 2022-12-13 DIAGNOSIS — E039 Hypothyroidism, unspecified: Secondary | ICD-10-CM | POA: Diagnosis not present

## 2022-12-13 DIAGNOSIS — Z794 Long term (current) use of insulin: Secondary | ICD-10-CM | POA: Diagnosis not present

## 2022-12-13 DIAGNOSIS — I700 Atherosclerosis of aorta: Secondary | ICD-10-CM | POA: Diagnosis not present

## 2022-12-13 DIAGNOSIS — J301 Allergic rhinitis due to pollen: Secondary | ICD-10-CM | POA: Diagnosis not present

## 2022-12-13 DIAGNOSIS — I10 Essential (primary) hypertension: Secondary | ICD-10-CM | POA: Diagnosis not present

## 2022-12-13 DIAGNOSIS — E1122 Type 2 diabetes mellitus with diabetic chronic kidney disease: Secondary | ICD-10-CM | POA: Diagnosis not present

## 2022-12-13 DIAGNOSIS — E782 Mixed hyperlipidemia: Secondary | ICD-10-CM | POA: Diagnosis not present

## 2022-12-13 DIAGNOSIS — M1A0720 Idiopathic chronic gout, left ankle and foot, without tophus (tophi): Secondary | ICD-10-CM | POA: Diagnosis not present

## 2022-12-13 DIAGNOSIS — J449 Chronic obstructive pulmonary disease, unspecified: Secondary | ICD-10-CM | POA: Diagnosis not present

## 2022-12-13 DIAGNOSIS — N1831 Chronic kidney disease, stage 3a: Secondary | ICD-10-CM | POA: Diagnosis not present

## 2022-12-13 DIAGNOSIS — G2581 Restless legs syndrome: Secondary | ICD-10-CM | POA: Diagnosis not present

## 2022-12-13 DIAGNOSIS — E538 Deficiency of other specified B group vitamins: Secondary | ICD-10-CM | POA: Diagnosis not present

## 2022-12-13 DIAGNOSIS — K219 Gastro-esophageal reflux disease without esophagitis: Secondary | ICD-10-CM | POA: Diagnosis not present

## 2022-12-13 DIAGNOSIS — Z79899 Other long term (current) drug therapy: Secondary | ICD-10-CM | POA: Diagnosis not present

## 2022-12-13 DIAGNOSIS — Z1331 Encounter for screening for depression: Secondary | ICD-10-CM | POA: Diagnosis not present

## 2022-12-23 DIAGNOSIS — J42 Unspecified chronic bronchitis: Secondary | ICD-10-CM | POA: Diagnosis not present

## 2022-12-27 DIAGNOSIS — J301 Allergic rhinitis due to pollen: Secondary | ICD-10-CM | POA: Diagnosis not present

## 2023-01-03 DIAGNOSIS — J301 Allergic rhinitis due to pollen: Secondary | ICD-10-CM | POA: Diagnosis not present

## 2023-01-06 DIAGNOSIS — E1142 Type 2 diabetes mellitus with diabetic polyneuropathy: Secondary | ICD-10-CM | POA: Diagnosis not present

## 2023-01-06 DIAGNOSIS — Z794 Long term (current) use of insulin: Secondary | ICD-10-CM | POA: Diagnosis not present

## 2023-01-10 DIAGNOSIS — J301 Allergic rhinitis due to pollen: Secondary | ICD-10-CM | POA: Diagnosis not present

## 2023-01-17 DIAGNOSIS — J301 Allergic rhinitis due to pollen: Secondary | ICD-10-CM | POA: Diagnosis not present

## 2023-01-27 DIAGNOSIS — R0982 Postnasal drip: Secondary | ICD-10-CM | POA: Diagnosis not present

## 2023-01-27 DIAGNOSIS — J069 Acute upper respiratory infection, unspecified: Secondary | ICD-10-CM | POA: Diagnosis not present

## 2023-02-07 DIAGNOSIS — J301 Allergic rhinitis due to pollen: Secondary | ICD-10-CM | POA: Diagnosis not present

## 2023-02-14 DIAGNOSIS — J301 Allergic rhinitis due to pollen: Secondary | ICD-10-CM | POA: Diagnosis not present

## 2023-02-21 DIAGNOSIS — J301 Allergic rhinitis due to pollen: Secondary | ICD-10-CM | POA: Diagnosis not present

## 2023-02-28 DIAGNOSIS — J301 Allergic rhinitis due to pollen: Secondary | ICD-10-CM | POA: Diagnosis not present

## 2023-03-07 DIAGNOSIS — J301 Allergic rhinitis due to pollen: Secondary | ICD-10-CM | POA: Diagnosis not present

## 2023-03-14 DIAGNOSIS — J301 Allergic rhinitis due to pollen: Secondary | ICD-10-CM | POA: Diagnosis not present

## 2023-03-18 DIAGNOSIS — I700 Atherosclerosis of aorta: Secondary | ICD-10-CM | POA: Diagnosis not present

## 2023-03-18 DIAGNOSIS — I10 Essential (primary) hypertension: Secondary | ICD-10-CM | POA: Diagnosis not present

## 2023-03-18 DIAGNOSIS — E782 Mixed hyperlipidemia: Secondary | ICD-10-CM | POA: Diagnosis not present

## 2023-03-18 DIAGNOSIS — K219 Gastro-esophageal reflux disease without esophagitis: Secondary | ICD-10-CM | POA: Diagnosis not present

## 2023-03-18 DIAGNOSIS — Z978 Presence of other specified devices: Secondary | ICD-10-CM | POA: Diagnosis not present

## 2023-03-18 DIAGNOSIS — E538 Deficiency of other specified B group vitamins: Secondary | ICD-10-CM | POA: Diagnosis not present

## 2023-03-18 DIAGNOSIS — E039 Hypothyroidism, unspecified: Secondary | ICD-10-CM | POA: Diagnosis not present

## 2023-03-18 DIAGNOSIS — Z79899 Other long term (current) drug therapy: Secondary | ICD-10-CM | POA: Diagnosis not present

## 2023-03-18 DIAGNOSIS — G2581 Restless legs syndrome: Secondary | ICD-10-CM | POA: Diagnosis not present

## 2023-03-18 DIAGNOSIS — E1122 Type 2 diabetes mellitus with diabetic chronic kidney disease: Secondary | ICD-10-CM | POA: Diagnosis not present

## 2023-03-18 DIAGNOSIS — J449 Chronic obstructive pulmonary disease, unspecified: Secondary | ICD-10-CM | POA: Diagnosis not present

## 2023-03-18 DIAGNOSIS — M1A0720 Idiopathic chronic gout, left ankle and foot, without tophus (tophi): Secondary | ICD-10-CM | POA: Diagnosis not present

## 2023-03-18 DIAGNOSIS — N1831 Chronic kidney disease, stage 3a: Secondary | ICD-10-CM | POA: Diagnosis not present

## 2023-03-18 DIAGNOSIS — Z794 Long term (current) use of insulin: Secondary | ICD-10-CM | POA: Diagnosis not present

## 2023-03-20 DIAGNOSIS — M1A0720 Idiopathic chronic gout, left ankle and foot, without tophus (tophi): Secondary | ICD-10-CM | POA: Diagnosis not present

## 2023-03-20 DIAGNOSIS — Z79899 Other long term (current) drug therapy: Secondary | ICD-10-CM | POA: Diagnosis not present

## 2023-03-20 DIAGNOSIS — R748 Abnormal levels of other serum enzymes: Secondary | ICD-10-CM | POA: Diagnosis not present

## 2023-03-21 DIAGNOSIS — J301 Allergic rhinitis due to pollen: Secondary | ICD-10-CM | POA: Diagnosis not present

## 2023-03-28 DIAGNOSIS — J301 Allergic rhinitis due to pollen: Secondary | ICD-10-CM | POA: Diagnosis not present

## 2023-03-31 DIAGNOSIS — J301 Allergic rhinitis due to pollen: Secondary | ICD-10-CM | POA: Diagnosis not present

## 2023-04-04 DIAGNOSIS — J301 Allergic rhinitis due to pollen: Secondary | ICD-10-CM | POA: Diagnosis not present

## 2023-04-11 DIAGNOSIS — J301 Allergic rhinitis due to pollen: Secondary | ICD-10-CM | POA: Diagnosis not present

## 2023-04-18 DIAGNOSIS — J301 Allergic rhinitis due to pollen: Secondary | ICD-10-CM | POA: Diagnosis not present

## 2023-04-25 DIAGNOSIS — J301 Allergic rhinitis due to pollen: Secondary | ICD-10-CM | POA: Diagnosis not present

## 2023-05-02 DIAGNOSIS — J301 Allergic rhinitis due to pollen: Secondary | ICD-10-CM | POA: Diagnosis not present

## 2023-05-16 DIAGNOSIS — J301 Allergic rhinitis due to pollen: Secondary | ICD-10-CM | POA: Diagnosis not present

## 2023-05-30 DIAGNOSIS — J301 Allergic rhinitis due to pollen: Secondary | ICD-10-CM | POA: Diagnosis not present

## 2023-06-06 DIAGNOSIS — J301 Allergic rhinitis due to pollen: Secondary | ICD-10-CM | POA: Diagnosis not present

## 2023-06-13 DIAGNOSIS — J301 Allergic rhinitis due to pollen: Secondary | ICD-10-CM | POA: Diagnosis not present

## 2023-06-18 DIAGNOSIS — Z978 Presence of other specified devices: Secondary | ICD-10-CM | POA: Diagnosis not present

## 2023-06-18 DIAGNOSIS — E782 Mixed hyperlipidemia: Secondary | ICD-10-CM | POA: Diagnosis not present

## 2023-06-18 DIAGNOSIS — E1122 Type 2 diabetes mellitus with diabetic chronic kidney disease: Secondary | ICD-10-CM | POA: Diagnosis not present

## 2023-06-18 DIAGNOSIS — I10 Essential (primary) hypertension: Secondary | ICD-10-CM | POA: Diagnosis not present

## 2023-06-18 DIAGNOSIS — Z79899 Other long term (current) drug therapy: Secondary | ICD-10-CM | POA: Diagnosis not present

## 2023-06-18 DIAGNOSIS — E039 Hypothyroidism, unspecified: Secondary | ICD-10-CM | POA: Diagnosis not present

## 2023-06-18 DIAGNOSIS — J449 Chronic obstructive pulmonary disease, unspecified: Secondary | ICD-10-CM | POA: Diagnosis not present

## 2023-06-18 DIAGNOSIS — M1A0720 Idiopathic chronic gout, left ankle and foot, without tophus (tophi): Secondary | ICD-10-CM | POA: Diagnosis not present

## 2023-06-18 DIAGNOSIS — K219 Gastro-esophageal reflux disease without esophagitis: Secondary | ICD-10-CM | POA: Diagnosis not present

## 2023-06-18 DIAGNOSIS — N1831 Chronic kidney disease, stage 3a: Secondary | ICD-10-CM | POA: Diagnosis not present

## 2023-06-18 DIAGNOSIS — I700 Atherosclerosis of aorta: Secondary | ICD-10-CM | POA: Diagnosis not present

## 2023-06-18 DIAGNOSIS — Z1331 Encounter for screening for depression: Secondary | ICD-10-CM | POA: Diagnosis not present

## 2023-06-18 DIAGNOSIS — Z794 Long term (current) use of insulin: Secondary | ICD-10-CM | POA: Diagnosis not present

## 2023-06-18 DIAGNOSIS — E538 Deficiency of other specified B group vitamins: Secondary | ICD-10-CM | POA: Diagnosis not present

## 2023-06-18 DIAGNOSIS — G2581 Restless legs syndrome: Secondary | ICD-10-CM | POA: Diagnosis not present

## 2023-06-20 DIAGNOSIS — J301 Allergic rhinitis due to pollen: Secondary | ICD-10-CM | POA: Diagnosis not present

## 2023-06-25 DIAGNOSIS — J301 Allergic rhinitis due to pollen: Secondary | ICD-10-CM | POA: Diagnosis not present

## 2023-06-27 DIAGNOSIS — J301 Allergic rhinitis due to pollen: Secondary | ICD-10-CM | POA: Diagnosis not present

## 2023-07-03 ENCOUNTER — Ambulatory Visit: Payer: Self-pay | Admitting: Urology

## 2023-07-03 ENCOUNTER — Encounter: Payer: Self-pay | Admitting: Urology

## 2023-07-03 VITALS — BP 151/96 | HR 92 | Ht 70.0 in | Wt 215.0 lb

## 2023-07-03 DIAGNOSIS — N401 Enlarged prostate with lower urinary tract symptoms: Secondary | ICD-10-CM | POA: Diagnosis not present

## 2023-07-03 LAB — URINALYSIS, COMPLETE
Bilirubin, UA: NEGATIVE
Glucose, UA: NEGATIVE
Ketones, UA: NEGATIVE
Leukocytes,UA: NEGATIVE
Nitrite, UA: NEGATIVE
Protein,UA: NEGATIVE
RBC, UA: NEGATIVE
Specific Gravity, UA: 1.01 (ref 1.005–1.030)
Urobilinogen, Ur: 0.2 mg/dL (ref 0.2–1.0)
pH, UA: 7 (ref 5.0–7.5)

## 2023-07-03 LAB — BLADDER SCAN AMB NON-IMAGING: Scan Result: 0

## 2023-07-03 LAB — MICROSCOPIC EXAMINATION
Bacteria, UA: NONE SEEN
RBC, Urine: NONE SEEN /HPF (ref 0–2)

## 2023-07-03 MED ORDER — TAMSULOSIN HCL 0.4 MG PO CAPS
0.4000 mg | ORAL_CAPSULE | Freq: Every day | ORAL | 3 refills | Status: AC
Start: 2023-07-03 — End: ?

## 2023-07-03 NOTE — Progress Notes (Signed)
 I, Maysun Jamey Mccallum, acting as a scribe for Geraline Knapp, MD., have documented all relevant documentation on the behalf of Geraline Knapp, MD, as directed by Geraline Knapp, MD while in the presence of Geraline Knapp, MD.  07/03/2023 2:46 PM   Nicholas Humphrey 1950/04/12 [id number]  Referring provider: Deliah Fells, NP [address] [place],  [place] [address]  Chief Complaint  Patient presents with   Benign Prostatic Hypertrophy   Urologic history: 1. BPH with LUTS Tamsulosin  0.4 mg daily.  Gemtesa  added 06/2022 for bothersome storage-related voiding symptoms.   2. Erectile dysfunction  HPI: Nicholas Humphrey is a 73 y.o. male presents for annual follow-up.   Doing quite well since his last visit. Remains on Gemtesa  and tamsulosin  and has no bothersome lower urinary tract symptoms.  IPSS today 5/35.   PMH: Past Medical History:  Diagnosis Date   Diabetes mellitus without complication (HCC)    Diabetic neuropathy (HCC)    Fall from ladder 2018   LANDED ON CONCRETE   GERD (gastroesophageal reflux disease)    Hypercholesteremia    Hypertension    Hypothyroidism    PONV (postoperative nausea and vomiting)    Wears contact lenses     Surgical History: Past Surgical History:  Procedure Laterality Date   APPENDECTOMY     BACK SURGERY  2019   CARPAL TUNNEL RELEASE Right 08/18/2019   Procedure: ENDOSCOPIC RIGHT CARPAL TUNNEL RELEASE , RELEASE RIGHT LONG TRIGGER FINGER. INDEX TRIGGER AND TRIGGER THUMB RELEASE;  Surgeon: Elner Hahn, MD;  Location: [facility];  Service: Orthopedics;  Laterality: Right;   COLONOSCOPY WITH PROPOFOL  N/A 03/30/2021   Procedure: COLONOSCOPY WITH PROPOFOL ;  Surgeon: Shane Darling, MD;  Location: [facility];  Service: Endoscopy;  Laterality: N/A;  IDDM   ELBOW SURGERY Left 2018   X2   FRACTURE SURGERY Left    SEVERAL BROKEN BONES AFTER FALL   HAND SURGERY Left 2018   HARDWARE REMOVED   HAND SURGERY Left 2018   HARDWARE  AFTER FALL   INGUINAL HERNIA REPAIR Right    KNEE ARTHROSCOPY W/ PARTIAL MEDIAL MENISCECTOMY Left 08/17/2010   LUMBAR LAMINECTOMY     ROTATOR CUFF REPAIR Right 2013   TRIGGER FINGER RELEASE Right 10/04/2015   Procedure: RELEASE TRIGGER FINGER/A-1 PULLEY;  Surgeon: Elner Hahn, MD;  Location: [facility];  Service: Orthopedics;  Laterality: Right;  PREFER BIER BLOCK Diabetic - insulin  and oral meds   TYMPANOPLASTY Right     Home Medications:  Allergies as of 07/03/2023       Reactions   Pregabalin Swelling   Legs and ankles   Oxycodone  Nausea And Vomiting   Other reaction(s): Hallucinations, Vomiting   Crestor [rosuvastatin]    Muscle pain   Doxycycline Nausea Only   Lipitor [atorvastatin]    Muscle pain   Tramadol  Anxiety   Other reaction(s): Hallucinations        Medication List        Accurate as of July 03, 2023 11:59 PM. If you have any questions, ask your nurse or doctor.          STOP taking these medications    fluticasone-salmeterol 250-50 MCG/ACT Aepb Commonly known as: ADVAIR Stopped by: Geraline Knapp   gabapentin 300 MG capsule Commonly known as: NEURONTIN Stopped by: Serenitie Vinton C Calyn Sivils   LORazepam 0.5 MG tablet Commonly known as: ATIVAN Stopped by: Geraline Knapp   magnesium oxide 400  MG tablet Commonly known as: MAG-OX Stopped by: Geraline Knapp       TAKE these medications    allopurinol 100 MG tablet Commonly known as: ZYLOPRIM Take 100 mg by mouth daily.   aspirin  81 MG tablet Take 81 mg by mouth daily.   colchicine 0.6 MG tablet   ELDERBERRY PO Take 1 capsule by mouth daily.   EPINEPHrine 0.3 mg/0.3 mL Soaj injection Commonly known as: EPI-PEN Inject into the muscle.   FreeStyle Libre 2 Sensor Misc   Gemtesa  75 MG Tabs Generic drug: Vibegron  Take 1 tablet (75 mg total) by mouth daily.   insulin  degludec 200 UNIT/ML FlexTouch Pen Commonly known as: TRESIBA  Inject 124 Units into the skin daily.    levothyroxine  137 MCG tablet Commonly known as: SYNTHROID  Take 137 mcg by mouth daily.   loratadine 10 MG tablet Commonly known as: CLARITIN Take 10 mg by mouth daily.   losartan-hydrochlorothiazide 50-12.5 MG tablet Commonly known as: HYZAAR Take 1 tablet by mouth daily.   metFORMIN 1000 MG tablet Commonly known as: GLUCOPHAGE Take 1,000-1,500 mg by mouth See admin instructions. 1000 mg in AM. 1500 mg in PM.   methocarbamol 500 MG tablet Commonly known as: ROBAXIN Take 500 mg by mouth 2 (two) times daily.   montelukast 10 MG tablet Commonly known as: SINGULAIR Take 10 mg by mouth at bedtime.   multivitamin tablet Take 1 tablet by mouth daily.   naproxen 500 MG tablet Commonly known as: NAPROSYN Take 500 mg by mouth 2 (two) times daily.   NovoLOG  FlexPen 100 UNIT/ML FlexPen Generic drug: insulin  aspart Novolog  FlexPen U-100 Insulin    Ozempic  (1 MG/DOSE) 4 MG/3ML Sopn Generic drug: Semaglutide  (1 MG/DOSE) Inject 1 mg into the skin once a week.   pantoprazole  40 MG tablet Commonly known as: PROTONIX  Take 40 mg by mouth 2 (two) times daily.   PARoxetine  20 MG tablet Commonly known as: PAXIL  Take 20 mg by mouth daily.   sildenafil  100 MG tablet Commonly known as: VIAGRA  Take 1 tab 1 hour prior to intercourse   simvastatin  40 MG tablet Commonly known as: ZOCOR  Take 40 mg by mouth at bedtime.   tamsulosin  0.4 MG Caps capsule Commonly known as: FLOMAX  Take 1 capsule (0.4 mg total) by mouth daily.   vitamin B-12 500 MCG tablet Commonly known as: CYANOCOBALAMIN  Take 500 mcg by mouth daily.        Allergies:  Allergies  Allergen Reactions   Pregabalin Swelling    Legs and ankles   Oxycodone  Nausea And Vomiting    Other reaction(s): Hallucinations, Vomiting   Crestor [Rosuvastatin]     Muscle pain   Doxycycline Nausea Only   Lipitor [Atorvastatin]     Muscle pain   Tramadol  Anxiety    Other reaction(s): Hallucinations    Family  History: Family History  Problem Relation Age of Onset   Prostate cancer Father    Bladder Cancer Neg Hx    Kidney cancer Neg Hx     Social History:  reports that he has never smoked. He has never used smokeless tobacco. He reports that he does not drink alcohol  and does not use drugs.   Physical Exam: BP (!) 151/96   Pulse 92   Ht 5' 10 (1.778 m)   Wt 215 lb (97.5 kg)   BMI 30.85 kg/m   Constitutional:  Alert and oriented, No acute distress. HEENT: [place] AT Respiratory: Normal respiratory effort, no increased work of breathing. Psychiatric: Normal mood  and affect.   Urinalysis Dipstick/microscopy negative   Assessment & Plan:    1. BPH with LUTS Doing well on tamsulosin  and Gemtesa   Tamsulosin  refilled He did not need Gemtesa  refills at this time 1 year follow-up with IPSS/PVR  I have reviewed the above documentation for accuracy and completeness, and I agree with the above.   Geraline Knapp, MD  [facility] [address], [address] [place], [place] [address] [phone]

## 2023-07-04 DIAGNOSIS — J301 Allergic rhinitis due to pollen: Secondary | ICD-10-CM | POA: Diagnosis not present

## 2023-07-11 DIAGNOSIS — J301 Allergic rhinitis due to pollen: Secondary | ICD-10-CM | POA: Diagnosis not present

## 2023-07-14 DIAGNOSIS — E1142 Type 2 diabetes mellitus with diabetic polyneuropathy: Secondary | ICD-10-CM | POA: Diagnosis not present

## 2023-07-14 DIAGNOSIS — B351 Tinea unguium: Secondary | ICD-10-CM | POA: Diagnosis not present

## 2023-07-14 DIAGNOSIS — Z794 Long term (current) use of insulin: Secondary | ICD-10-CM | POA: Diagnosis not present

## 2023-07-22 DIAGNOSIS — J301 Allergic rhinitis due to pollen: Secondary | ICD-10-CM | POA: Diagnosis not present

## 2023-08-08 DIAGNOSIS — J301 Allergic rhinitis due to pollen: Secondary | ICD-10-CM | POA: Diagnosis not present

## 2023-08-15 DIAGNOSIS — J301 Allergic rhinitis due to pollen: Secondary | ICD-10-CM | POA: Diagnosis not present

## 2023-08-19 DIAGNOSIS — H2513 Age-related nuclear cataract, bilateral: Secondary | ICD-10-CM | POA: Diagnosis not present

## 2023-08-19 DIAGNOSIS — H04123 Dry eye syndrome of bilateral lacrimal glands: Secondary | ICD-10-CM | POA: Diagnosis not present

## 2023-08-19 DIAGNOSIS — H25043 Posterior subcapsular polar age-related cataract, bilateral: Secondary | ICD-10-CM | POA: Diagnosis not present

## 2023-08-19 DIAGNOSIS — E113293 Type 2 diabetes mellitus with mild nonproliferative diabetic retinopathy without macular edema, bilateral: Secondary | ICD-10-CM | POA: Diagnosis not present

## 2023-08-22 DIAGNOSIS — J301 Allergic rhinitis due to pollen: Secondary | ICD-10-CM | POA: Diagnosis not present

## 2023-08-29 DIAGNOSIS — J301 Allergic rhinitis due to pollen: Secondary | ICD-10-CM | POA: Diagnosis not present

## 2023-09-02 DIAGNOSIS — H2513 Age-related nuclear cataract, bilateral: Secondary | ICD-10-CM | POA: Diagnosis not present

## 2023-09-02 DIAGNOSIS — H2511 Age-related nuclear cataract, right eye: Secondary | ICD-10-CM | POA: Diagnosis not present

## 2023-09-02 DIAGNOSIS — H2512 Age-related nuclear cataract, left eye: Secondary | ICD-10-CM | POA: Diagnosis not present

## 2023-09-10 DIAGNOSIS — J301 Allergic rhinitis due to pollen: Secondary | ICD-10-CM | POA: Diagnosis not present

## 2023-09-16 ENCOUNTER — Other Ambulatory Visit: Payer: Self-pay

## 2023-09-16 ENCOUNTER — Telehealth: Payer: Self-pay

## 2023-09-16 DIAGNOSIS — H2511 Age-related nuclear cataract, right eye: Secondary | ICD-10-CM | POA: Diagnosis not present

## 2023-09-16 DIAGNOSIS — N3281 Overactive bladder: Secondary | ICD-10-CM

## 2023-09-16 DIAGNOSIS — N401 Enlarged prostate with lower urinary tract symptoms: Secondary | ICD-10-CM

## 2023-09-16 DIAGNOSIS — H2512 Age-related nuclear cataract, left eye: Secondary | ICD-10-CM | POA: Diagnosis not present

## 2023-09-16 MED ORDER — GEMTESA 75 MG PO TABS: 75.0000 mg | ORAL_TABLET | Freq: Every day | ORAL | 2 refills | Status: DC

## 2023-09-16 NOTE — Telephone Encounter (Signed)
 Pt called and requested Gemtessa  to be refilled. Sent Rx into [facility] in [place]  per patients request.

## 2023-09-17 DIAGNOSIS — Z79899 Other long term (current) drug therapy: Secondary | ICD-10-CM | POA: Diagnosis not present

## 2023-09-17 DIAGNOSIS — M1A0790 Idiopathic chronic gout, unspecified ankle and foot, without tophus (tophi): Secondary | ICD-10-CM | POA: Diagnosis not present

## 2023-09-23 DIAGNOSIS — J301 Allergic rhinitis due to pollen: Secondary | ICD-10-CM | POA: Diagnosis not present

## 2023-09-24 ENCOUNTER — Encounter: Payer: Self-pay | Admitting: Ophthalmology

## 2023-09-24 DIAGNOSIS — Z978 Presence of other specified devices: Secondary | ICD-10-CM | POA: Diagnosis not present

## 2023-09-24 DIAGNOSIS — E1122 Type 2 diabetes mellitus with diabetic chronic kidney disease: Secondary | ICD-10-CM | POA: Diagnosis not present

## 2023-09-24 DIAGNOSIS — I10 Essential (primary) hypertension: Secondary | ICD-10-CM | POA: Diagnosis not present

## 2023-09-24 DIAGNOSIS — Z79899 Other long term (current) drug therapy: Secondary | ICD-10-CM | POA: Diagnosis not present

## 2023-09-24 DIAGNOSIS — N1831 Chronic kidney disease, stage 3a: Secondary | ICD-10-CM | POA: Diagnosis not present

## 2023-09-24 DIAGNOSIS — K219 Gastro-esophageal reflux disease without esophagitis: Secondary | ICD-10-CM | POA: Diagnosis not present

## 2023-09-24 DIAGNOSIS — E1142 Type 2 diabetes mellitus with diabetic polyneuropathy: Secondary | ICD-10-CM | POA: Diagnosis not present

## 2023-09-24 DIAGNOSIS — Z794 Long term (current) use of insulin: Secondary | ICD-10-CM | POA: Diagnosis not present

## 2023-09-24 DIAGNOSIS — E538 Deficiency of other specified B group vitamins: Secondary | ICD-10-CM | POA: Diagnosis not present

## 2023-09-24 DIAGNOSIS — G2581 Restless legs syndrome: Secondary | ICD-10-CM | POA: Diagnosis not present

## 2023-09-24 DIAGNOSIS — J449 Chronic obstructive pulmonary disease, unspecified: Secondary | ICD-10-CM | POA: Diagnosis not present

## 2023-09-24 DIAGNOSIS — Z125 Encounter for screening for malignant neoplasm of prostate: Secondary | ICD-10-CM | POA: Diagnosis not present

## 2023-09-24 DIAGNOSIS — E039 Hypothyroidism, unspecified: Secondary | ICD-10-CM | POA: Diagnosis not present

## 2023-09-24 DIAGNOSIS — E782 Mixed hyperlipidemia: Secondary | ICD-10-CM | POA: Diagnosis not present

## 2023-09-24 DIAGNOSIS — I700 Atherosclerosis of aorta: Secondary | ICD-10-CM | POA: Diagnosis not present

## 2023-09-24 DIAGNOSIS — Z0000 Encounter for general adult medical examination without abnormal findings: Secondary | ICD-10-CM | POA: Diagnosis not present

## 2023-09-24 DIAGNOSIS — E669 Obesity, unspecified: Secondary | ICD-10-CM | POA: Diagnosis not present

## 2023-09-24 DIAGNOSIS — M1A0720 Idiopathic chronic gout, left ankle and foot, without tophus (tophi): Secondary | ICD-10-CM | POA: Diagnosis not present

## 2023-09-24 NOTE — Anesthesia Preprocedure Evaluation (Addendum)
 Anesthesia Evaluation  Patient identified by MRN, date of birth, ID band Patient awake    Reviewed: Allergy & Precautions, H&P , NPO status , Patient's Chart, lab work & pertinent test results  History of Anesthesia Complications (+) PONV and history of anesthetic complications  Airway Mallampati: II  TM Distance: >3 FB Neck ROM: Full    Dental no notable dental hx.    Pulmonary neg pulmonary ROS, pneumonia, COPD   Pulmonary exam normal breath sounds clear to auscultation       Cardiovascular hypertension, Normal cardiovascular exam Rhythm:Regular Rate:Normal     Neuro/Psych negative neurological ROS  negative psych ROS   GI/Hepatic negative GI ROS, Neg liver ROS,GERD  ,,  Endo/Other  diabetesHypothyroidism    Renal/GU Renal diseasenegative Renal ROS  negative genitourinary   Musculoskeletal negative musculoskeletal ROS (+) Arthritis ,    Abdominal   Peds negative pediatric ROS (+)  Hematology negative hematology ROS (+)   Anesthesia Other Findings   Hypertension  Hypercholesteremia GERD (gastroesophageal reflux disease) Hypothyroidism PONV (postoperative nausea and vomiting) Will administer zofran  4 mg IV preop Diabetic neuropathy (HCC) Wears contact lenses  Fall from ladder Arthritis  Idiopathic gout Type 2 diabetes mellitus with diabetic neuropathy, with long-term current use of insulin  (HCC)  Allergic rhinitis due to pollen Chronic bronchitis (HCC)  COPD (chronic obstructive pulmonary disease) (HCC) Obesity (BMI 30-39.9)      Reproductive/Obstetrics negative OB ROS                              Anesthesia Physical Anesthesia Plan  ASA: 3  Anesthesia Plan: MAC   Post-op Pain Management:    Induction: Intravenous  PONV Risk Score and Plan:   Airway Management Planned: Natural Airway and Nasal Cannula  Additional Equipment:   Intra-op Plan:   Post-operative  Plan:   Informed Consent: I have reviewed the patients History and Physical, chart, labs and discussed the procedure including the risks, benefits and alternatives for the proposed anesthesia with the patient or authorized representative who has indicated his/her understanding and acceptance.     Dental Advisory Given  Plan Discussed with: Anesthesiologist, CRNA and Surgeon  Anesthesia Plan Comments: (Patient consented for risks of anesthesia including but not limited to:  - adverse reactions to medications - damage to eyes, teeth, lips or other oral mucosa - nerve damage due to positioning  - sore throat or hoarseness - Damage to heart, brain, nerves, lungs, other parts of body or loss of life  Patient voiced understanding and assent.)        Anesthesia Quick Evaluation

## 2023-09-25 NOTE — Discharge Instructions (Signed)

## 2023-09-29 ENCOUNTER — Encounter
Admission: RE | Disposition: A | Discharge status: Home / Self Care | Payer: Self-pay | Source: Home / Self Care | Attending: Ophthalmology

## 2023-09-29 ENCOUNTER — Encounter: Payer: Self-pay | Admitting: Ophthalmology

## 2023-09-29 ENCOUNTER — Other Ambulatory Visit: Payer: Self-pay

## 2023-09-29 ENCOUNTER — Ambulatory Visit
Admission: RE | Admit: 2023-09-29 | Discharge: 2023-09-29 | Disposition: A | Discharge status: Home / Self Care | Attending: Ophthalmology | Admitting: Ophthalmology

## 2023-09-29 ENCOUNTER — Ambulatory Visit: Payer: Self-pay | Admitting: Anesthesiology

## 2023-09-29 DIAGNOSIS — Z5986 Financial insecurity: Secondary | ICD-10-CM | POA: Diagnosis not present

## 2023-09-29 DIAGNOSIS — K219 Gastro-esophageal reflux disease without esophagitis: Secondary | ICD-10-CM | POA: Diagnosis not present

## 2023-09-29 DIAGNOSIS — Z794 Long term (current) use of insulin: Secondary | ICD-10-CM | POA: Insufficient documentation

## 2023-09-29 DIAGNOSIS — Z7984 Long term (current) use of oral hypoglycemic drugs: Secondary | ICD-10-CM | POA: Diagnosis not present

## 2023-09-29 DIAGNOSIS — H25041 Posterior subcapsular polar age-related cataract, right eye: Secondary | ICD-10-CM | POA: Diagnosis not present

## 2023-09-29 DIAGNOSIS — H2511 Age-related nuclear cataract, right eye: Secondary | ICD-10-CM | POA: Diagnosis not present

## 2023-09-29 DIAGNOSIS — J449 Chronic obstructive pulmonary disease, unspecified: Secondary | ICD-10-CM | POA: Insufficient documentation

## 2023-09-29 DIAGNOSIS — Z79890 Hormone replacement therapy (postmenopausal): Secondary | ICD-10-CM | POA: Insufficient documentation

## 2023-09-29 DIAGNOSIS — E039 Hypothyroidism, unspecified: Secondary | ICD-10-CM | POA: Insufficient documentation

## 2023-09-29 DIAGNOSIS — E1136 Type 2 diabetes mellitus with diabetic cataract: Secondary | ICD-10-CM | POA: Insufficient documentation

## 2023-09-29 DIAGNOSIS — I10 Essential (primary) hypertension: Secondary | ICD-10-CM | POA: Diagnosis not present

## 2023-09-29 HISTORY — DX: Idiopathic gout, unspecified site: M10.00

## 2023-09-29 HISTORY — PX: CATARACT EXTRACTION W/PHACO: SHX586

## 2023-09-29 HISTORY — DX: Unspecified osteoarthritis, unspecified site: M19.90

## 2023-09-29 HISTORY — DX: Type 2 diabetes mellitus with diabetic neuropathy, unspecified: E11.40

## 2023-09-29 HISTORY — DX: Allergic rhinitis due to pollen: J30.1

## 2023-09-29 HISTORY — DX: Obesity, unspecified: E66.9

## 2023-09-29 HISTORY — DX: Chronic obstructive pulmonary disease, unspecified: J44.9

## 2023-09-29 HISTORY — DX: Unspecified chronic bronchitis: J42

## 2023-09-29 LAB — GLUCOSE, CAPILLARY: Glucose-Capillary: 137 mg/dL — ABNORMAL HIGH (ref 70–99)

## 2023-09-29 SURGERY — PHACOEMULSIFICATION, CATARACT, WITH IOL INSERTION
Anesthesia: Monitor Anesthesia Care | Site: Eye | Laterality: Right

## 2023-09-29 MED ORDER — SIGHTPATH DOSE#1 NA HYALUR & NA CHOND-NA HYALUR IO KIT
PACK | INTRAOCULAR | Status: DC | PRN
  Administered 2023-09-29: 1 via OPHTHALMIC

## 2023-09-29 MED ORDER — SIGHTPATH DOSE#1 BSS IO SOLN
INTRAOCULAR | Status: DC | PRN
  Administered 2023-09-29: 15 mL via INTRAOCULAR

## 2023-09-29 MED ORDER — SIGHTPATH DOSE#1 BSS IO SOLN
INTRAOCULAR | Status: DC | PRN
  Administered 2023-09-29: 98 mL via OPHTHALMIC

## 2023-09-29 MED ORDER — MIDAZOLAM HCL 2 MG/2ML IJ SOLN
INTRAMUSCULAR | Status: DC | PRN
  Administered 2023-09-29: 2 mg via INTRAVENOUS

## 2023-09-29 MED ORDER — MOXIFLOXACIN HCL 0.5 % OP SOLN
OPHTHALMIC | Status: DC | PRN
  Administered 2023-09-29: .2 mL via OPHTHALMIC

## 2023-09-29 MED ORDER — ONDANSETRON HCL 4 MG/2ML IJ SOLN
INTRAMUSCULAR | Status: AC
Start: 2023-09-29 — End: 2023-09-29
  Filled 2023-09-29: qty 2

## 2023-09-29 MED ORDER — LIDOCAINE HCL (PF) 2 % IJ SOLN
INTRAOCULAR | Status: DC | PRN
  Administered 2023-09-29: 4 mL via INTRAOCULAR

## 2023-09-29 MED ORDER — LACTATED RINGERS IV SOLN: INTRAVENOUS | Status: DC

## 2023-09-29 MED ORDER — FENTANYL CITRATE (PF) 100 MCG/2ML IJ SOLN
INTRAMUSCULAR | Status: AC
Start: 2023-09-29 — End: 2023-09-29
  Filled 2023-09-29: qty 2

## 2023-09-29 MED ORDER — TETRACAINE HCL 0.5 % OP SOLN
1.0000 [drp] | OPHTHALMIC | Status: DC | PRN
  Administered 2023-09-29 (×3): 1 [drp] via OPHTHALMIC

## 2023-09-29 MED ORDER — ONDANSETRON HCL 4 MG/2ML IJ SOLN
4.0000 mg | Freq: Once | INTRAMUSCULAR | Status: AC
  Administered 2023-09-29: 4 mg via INTRAVENOUS

## 2023-09-29 MED ORDER — ARMC OPHTHALMIC DILATING DROPS
OPHTHALMIC | Status: AC
  Filled 2023-09-29: qty 0.5

## 2023-09-29 MED ORDER — TETRACAINE HCL 0.5 % OP SOLN
OPHTHALMIC | Status: AC
Start: 2023-09-29 — End: 2023-09-29
  Filled 2023-09-29: qty 4

## 2023-09-29 MED ORDER — FENTANYL CITRATE (PF) 100 MCG/2ML IJ SOLN
INTRAMUSCULAR | Status: DC | PRN
  Administered 2023-09-29 (×2): 50 ug via INTRAVENOUS

## 2023-09-29 MED ORDER — MIDAZOLAM HCL 2 MG/2ML IJ SOLN
INTRAMUSCULAR | Status: AC
  Filled 2023-09-29: qty 2

## 2023-09-29 MED ORDER — ARMC OPHTHALMIC DILATING DROPS
1.0000 | OPHTHALMIC | Status: DC | PRN
  Administered 2023-09-29 (×3): 1 via OPHTHALMIC

## 2023-09-29 SURGICAL SUPPLY — 9 items
DISSECTOR HYDRO NUCLEUS 50X22 (MISCELLANEOUS) ×1 IMPLANT
FEE CATARACT SUITE SIGHTPATH (MISCELLANEOUS) ×1 IMPLANT
GLOVE PI ULTRA LF STRL 7.5 (GLOVE) ×1 IMPLANT
GLOVE SURG SYN 6.5 PF PI BL (GLOVE) ×1 IMPLANT
GLOVE SURG SYN 8.5 PF PI BL (GLOVE) ×1 IMPLANT
LENS IOL TECNIS EYHANCE 16.5 (Intraocular Lens) IMPLANT
NDL FILTER BLUNT 18X1 1/2 (NEEDLE) ×1 IMPLANT
NEEDLE FILTER BLUNT 18X1 1/2 (NEEDLE) ×1 IMPLANT
SYR 3ML LL SCALE MARK (SYRINGE) ×1 IMPLANT

## 2023-09-29 NOTE — Transfer of Care (Signed)
 Immediate Anesthesia Transfer of Care Note  Patient: Nicholas Humphrey  Procedure(s) Performed: PHACOEMULSIFICATION, CATARACT, WITH IOL  6.60 00:45.9 (Right: Eye)  Patient Location: PACU  Anesthesia Type: MAC  Level of Consciousness: awake, alert  and patient cooperative  Airway and Oxygen Therapy: Patient Spontanous Breathing and Patient connected to supplemental oxygen  Post-op Assessment: Post-op Vital signs reviewed, Patient's Cardiovascular Status Stable, Respiratory Function Stable, Patent Airway and No signs of Nausea or vomiting  Post-op Vital Signs: Reviewed and stable  Complications: No notable events documented.

## 2023-09-29 NOTE — Op Note (Signed)
 OPERATIVE NOTE  Nicholas Humphrey [id number] 09/29/2023   PREOPERATIVE DIAGNOSIS:  Nuclear sclerotic cataract right eye.  H25.11   POSTOPERATIVE DIAGNOSIS:    Nuclear sclerotic cataract right eye.     PROCEDURE:  Phacoemusification with posterior chamber intraocular lens placement of the right eye   LENS:   Implant Name Type Inv. Item Serial No. Manufacturer Lot No. LRB No. Used Action  LENS IOL TECNIS EYHANCE 16.5 - [id number] Intraocular Lens LENS IOL TECNIS EYHANCE 16.5 [id number] [employer]  Right 1 Implanted       Procedure(s): PHACOEMULSIFICATION, CATARACT, WITH IOL  6.60 00:45.9 (Right)  SURGEON:  Adine Novak, MD, MPH  ANESTHESIOLOGIST: Anesthesiologist: Ola Donny BROCKS, MD CRNA: Veronica Alm BROCKS, CRNA   ANESTHESIA:  Topical with tetracaine  drops augmented with 1% preservative-free intracameral lidocaine .  ESTIMATED BLOOD LOSS: less than 1 mL.   COMPLICATIONS:  None.   DESCRIPTION OF PROCEDURE:  The patient was identified in the holding room and transported to the operating room and placed in the supine position under the operating microscope.  The right eye was identified as the operative eye and it was prepped and draped in the usual sterile ophthalmic fashion.   A 1.0 millimeter clear-corneal paracentesis was made at the 10:30 position. 0.5 ml of preservative-free 1% lidocaine  with epinephrine  was injected into the anterior chamber.  The anterior chamber was filled with viscoelastic.  A 2.4 millimeter keratome was used to make a near-clear corneal incision at the 8:00 position.  A curvilinear capsulorrhexis was made with a cystotome and capsulorrhexis forceps.  Balanced salt solution was used to hydrodissect and hydrodelineate the nucleus.   Phacoemulsification was then used in stop and chop fashion to remove the lens nucleus and epinucleus.  The remaining cortex was then removed using the irrigation and aspiration handpiece. Viscoelastic was then placed into the capsular  bag to distend it for lens placement.  A lens was then injected into the capsular bag.  The remaining viscoelastic was aspirated.   Wounds were hydrated with balanced salt solution.  The anterior chamber was inflated to a physiologic pressure with balanced salt solution.   Intracameral vigamox  0.1 mL undiluted was injected into the eye and a drop placed onto the ocular surface.  No wound leaks were noted.  The patient was taken to the recovery room in stable condition without complications of anesthesia or surgery  Adine Novak 09/29/2023, 8:22 AM

## 2023-09-29 NOTE — Anesthesia Postprocedure Evaluation (Signed)
 Anesthesia Post Note  Patient: Nicholas Humphrey  Procedure(s) Performed: PHACOEMULSIFICATION, CATARACT, WITH IOL  6.60 00:45.9 (Right: Eye)  Patient location during evaluation: PACU Anesthesia Type: MAC Level of consciousness: awake and alert Pain management: pain level controlled Vital Signs Assessment: post-procedure vital signs reviewed and stable Respiratory status: spontaneous breathing, nonlabored ventilation, respiratory function stable and patient connected to nasal cannula oxygen Cardiovascular status: blood pressure returned to baseline and stable Postop Assessment: no apparent nausea or vomiting Anesthetic complications: no   No notable events documented.   Last Vitals:  Vitals:   09/29/23 0824 09/29/23 0829  BP: (!) 129/57 127/64  Pulse: 86 88  Resp: 14 (!) 28  Temp: 36.6 C 36.6 C  SpO2: 98% 96%    Last Pain:  Vitals:   09/29/23 0829  TempSrc:   PainSc: 0-No pain                 Donny JAYSON Mu

## 2023-09-29 NOTE — H&P (Signed)
 [facility]   Primary Care Physician:  Don Lauraine Collar, NP Ophthalmologist: Dr. Adine Novak  Pre-Procedure History & Physical: HPI:  Nicholas Humphrey is a 73 y.o. male here for cataract surgery.   Past Medical History:  Diagnosis Date   Allergic rhinitis due to pollen    Arthritis    Chronic bronchitis (HCC)    COPD (chronic obstructive pulmonary disease) (HCC)    Diabetic neuropathy (HCC)    Fall from ladder 2018   LANDED ON CONCRETE   GERD (gastroesophageal reflux disease)    Hypercholesteremia    Hypertension    Hypothyroidism    Idiopathic gout    Obesity (BMI 30-39.9)    PONV (postoperative nausea and vomiting)    Type 2 diabetes mellitus with diabetic neuropathy, with long-term current use of insulin  (HCC)    Wears contact lenses     Past Surgical History:  Procedure Laterality Date   APPENDECTOMY     BACK SURGERY  2019   CARPAL TUNNEL RELEASE Right 08/18/2019   Procedure: ENDOSCOPIC RIGHT CARPAL TUNNEL RELEASE , RELEASE RIGHT LONG TRIGGER FINGER. INDEX TRIGGER AND TRIGGER THUMB RELEASE;  Surgeon: Edie Norleen PARAS, MD;  Location: [facility];  Service: Orthopedics;  Laterality: Right;   COLONOSCOPY WITH PROPOFOL  N/A 03/30/2021   Procedure: COLONOSCOPY WITH PROPOFOL ;  Surgeon: Maryruth Ole DASEN, MD;  Location: [facility];  Service: Endoscopy;  Laterality: N/A;  IDDM   ELBOW SURGERY Left 2018   X2   FRACTURE SURGERY Left    SEVERAL BROKEN BONES AFTER FALL   HAND SURGERY Left 2018   HARDWARE REMOVED   HAND SURGERY Left 2018   HARDWARE AFTER FALL   INGUINAL HERNIA REPAIR Right    KNEE ARTHROSCOPY W/ PARTIAL MEDIAL MENISCECTOMY Left 08/17/2010   LUMBAR LAMINECTOMY     ROTATOR CUFF REPAIR Right 2013   TRIGGER FINGER RELEASE Right 10/04/2015   Procedure: RELEASE TRIGGER FINGER/A-1 PULLEY;  Surgeon: Norleen PARAS Edie, MD;  Location: [facility];  Service: Orthopedics;  Laterality: Right;  PREFER BIER BLOCK Diabetic - insulin  and oral meds    TYMPANOPLASTY Right     Prior to Admission medications   Medication Sig Start Date End Date Taking? Authorizing Provider  allopurinol (ZYLOPRIM) 100 MG tablet Take 100 mg by mouth daily. 10/04/20  Yes [provider]  aspirin  81 MG tablet Take 81 mg by mouth daily.   Yes [provider]  ELDERBERRY PO Take 1 capsule by mouth daily.   Yes [provider]  insulin  aspart (NOVOLOG  FLEXPEN) 100 UNIT/ML FlexPen Novolog  FlexPen U-100 Insulin    Yes [provider]  insulin  degludec (TRESIBA ) 200 UNIT/ML FlexTouch Pen Inject 124 Units into the skin daily.    Yes [provider]  levothyroxine  (SYNTHROID ) 137 MCG tablet Take 137 mcg by mouth daily.   Yes [provider]  loratadine (CLARITIN) 10 MG tablet Take 10 mg by mouth daily.   Yes [provider]  losartan-hydrochlorothiazide (HYZAAR) 50-12.5 MG tablet Take 1 tablet by mouth daily. 11/23/20  Yes [provider]  metFORMIN (GLUCOPHAGE) 1000 MG tablet Take 1,000-1,500 mg by mouth See admin instructions. 1000 mg in AM. 1500 mg in PM.   Yes [provider]  montelukast (SINGULAIR) 10 MG tablet Take 10 mg by mouth at bedtime.   Yes [provider]  Multiple Vitamin (MULTIVITAMIN) tablet Take 1 tablet by mouth daily.   Yes [provider]  pantoprazole  (PROTONIX ) 40 MG tablet Take 40 mg by  mouth 2 (two) times daily. 11/15/20  Yes [provider]  Semaglutide , 1 MG/DOSE, (OZEMPIC , 1 MG/DOSE,) 4 MG/3ML SOPN Inject 1 mg into the skin once a week. 02/28/22  Yes   simvastatin  (ZOCOR ) 40 MG tablet Take 40 mg by mouth at bedtime.    Yes [provider]  tamsulosin  (FLOMAX ) 0.4 MG CAPS capsule Take 1 capsule (0.4 mg total) by mouth daily. 07/03/23  Yes Stoioff, Glendia BROCKS, MD  Vibegron  (GEMTESA ) 75 MG TABS Take 1 tablet (75 mg total) by mouth daily. 09/16/23  Yes Stoioff, Glendia BROCKS, MD  vitamin B-12 (CYANOCOBALAMIN ) 500 MCG tablet Take 500 mcg by mouth  daily.   Yes [provider]  colchicine 0.6 MG tablet  10/17/20   [provider]  Continuous Blood Gluc Sensor (FREESTYLE LIBRE 2 SENSOR) MISC  12/11/20   [provider]  EPINEPHrine  0.3 mg/0.3 mL IJ SOAJ injection Inject into the muscle. 12/01/20   [provider]    Allergies as of 08/21/2023 - Review Complete 07/03/2023  Allergen Reaction Noted   Pregabalin Swelling 12/05/2016   Oxycodone  Nausea And Vomiting 07/03/2017   Crestor [rosuvastatin]  09/27/2015   Doxycycline Nausea Only 02/05/2018   Lipitor [atorvastatin]  09/27/2015   Tramadol  Anxiety 12/05/2016    Family History  Problem Relation Age of Onset   Prostate cancer Father    Bladder Cancer Neg Hx    Kidney cancer Neg Hx     Social History   Socioeconomic History   Marital status: Married    Spouse name: Not on file   Number of children: Not on file   Years of education: Not on file   Highest education level: Not on file  Occupational History   Not on file  Tobacco Use   Smoking status: Never   Smokeless tobacco: Never  Vaping Use   Vaping status: Never Used  Substance and Sexual Activity   Alcohol  use: Never   Drug use: Never   Sexual activity: Yes    Birth control/protection: None  Other Topics Concern   Not on file  Social History Narrative   Not on file   Social Drivers of Health   Financial Resource Strain: Medium Risk (09/22/2023)   Received from [facility]   Overall Financial Resource Strain (CARDIA)    Difficulty of Paying Living Expenses: Somewhat hard  Food Insecurity: No Food Insecurity (09/22/2023)   Received from [facility]   Hunger Vital Sign    Within the past 12 months, you worried that your food would run out before you got the money to buy more.: Never true    Within the past 12 months, the food you bought just didn't last and you didn't have money to get more.: Never true  Transportation Needs: No  Transportation Needs (09/22/2023)   Received from [facility] - Transportation    In the past 12 months, has lack of transportation kept you from medical appointments or from getting medications?: No    Lack of Transportation (Non-Medical): No  Physical Activity: Insufficiently Active (01/07/2023)   Received from [facility]   Exercise Vital Sign    On average, how many minutes do you engage in exercise at this level?: 30 min    On average, how many days per week do you engage in moderate to strenuous exercise (like a brisk walk)?: 4 days  Stress: Stress Concern Present (01/07/2023)   Received from [facility]  [facility]   [employer] of Occupational [facility] - Occupational Stress Questionnaire    Feeling of Stress : To some extent  Social Connections: Socially Integrated (01/07/2023)   Received from [facility]   Social Connection and Isolation Panel    In a typical week, how many times do you talk on the phone with family, friends, or neighbors?: More than three times a week    How often do you get together with friends or relatives?: More than three times a week    How often do you attend church or religious services?: More than 4 times per year    Do you belong to any clubs or organizations such as church groups, unions, fraternal or athletic groups, or school groups?: Yes    How often do you attend meetings of the clubs or organizations you belong to?: More than 4 times per year    Are you married, widowed, divorced, separated, never married, or living with a partner?: Married  Intimate Partner Violence: Not on file    Review of Systems: See HPI, otherwise negative ROS  Physical Exam: BP (!) 152/71   Pulse 90   Temp (!) 96.7 F (35.9 C) (Temporal)   Resp 13   Ht 5' 7.99 (1.727 m)   Wt 103.2 kg   SpO2 99%   BMI 34.60 kg/m  General:   Alert, cooperative. Head:  Normocephalic and  atraumatic. Respiratory:  Normal work of breathing. Cardiovascular:  NAD  Impression/Plan: Charlie KATHEE Georges is here for cataract surgery.  Risks, benefits, limitations, and alternatives regarding cataract surgery have been reviewed with the patient.  Questions have been answered.  All parties agreeable.   Adine Novak, MD  09/29/2023, 7:56 AM

## 2023-09-30 ENCOUNTER — Encounter: Payer: Self-pay | Admitting: Ophthalmology

## 2023-09-30 DIAGNOSIS — H2512 Age-related nuclear cataract, left eye: Secondary | ICD-10-CM | POA: Diagnosis not present

## 2023-09-30 NOTE — Anesthesia Preprocedure Evaluation (Signed)
 Anesthesia Evaluation    Airway Mallampati: II  TM Distance: >3 FB     Dental   Pulmonary           Cardiovascular hypertension,      Neuro/Psych    GI/Hepatic   Endo/Other  diabetes    Renal/GU      Musculoskeletal   Abdominal   Peds  Hematology   Anesthesia Other Findings Previous cataract surgery 09-29-23 Dr. Ola   Hypertension             Hypercholesteremia GERD (gastroesophageal reflux disease)Hypothyroidism PONV (postoperative nausea and vomiting)Will administer zofran  4 mg IV preop Diabetic neuropathy (HCC) Wears contact lenses             Fall from ladder Arthritis             Idiopathic gout Type 2 diabetes mellitus with diabetic neuropathy, with long-term current use of insulin  (HCC)  Allergic rhinitis due to pollen Chronic bronchitis (HCC)         COPD (chronic obstructive pulmonary disease) (HCC) Obesity (BMI 30-39.9)        Reproductive/Obstetrics                              Anesthesia Physical Anesthesia Plan  ASA: 3  Anesthesia Plan: MAC   Post-op Pain Management:    Induction: Intravenous  PONV Risk Score and Plan:   Airway Management Planned: Natural Airway and Nasal Cannula  Additional Equipment:   Intra-op Plan:   Post-operative Plan:   Informed Consent: I have reviewed the patients History and Physical, chart, labs and discussed the procedure including the risks, benefits and alternatives for the proposed anesthesia with the patient or authorized representative who has indicated his/her understanding and acceptance.     Dental Advisory Given  Plan Discussed with: Anesthesiologist, CRNA and Surgeon  Anesthesia Plan Comments: (Patient consented for risks of anesthesia including but not limited to:  - adverse reactions to medications - damage to eyes, teeth, lips or other oral mucosa - nerve damage due to positioning  - sore throat or  hoarseness - Damage to heart, brain, nerves, lungs, other parts of body or loss of life  Patient voiced understanding and assent.)         Anesthesia Quick Evaluation

## 2023-10-03 DIAGNOSIS — J301 Allergic rhinitis due to pollen: Secondary | ICD-10-CM | POA: Diagnosis not present

## 2023-10-07 ENCOUNTER — Emergency Department
Admission: EM | Admit: 2023-10-07 | Discharge: 2023-10-08 | Disposition: A | Discharge status: Home / Self Care | Attending: Emergency Medicine | Admitting: Emergency Medicine

## 2023-10-07 ENCOUNTER — Emergency Department

## 2023-10-07 ENCOUNTER — Other Ambulatory Visit: Payer: Self-pay

## 2023-10-07 DIAGNOSIS — J449 Chronic obstructive pulmonary disease, unspecified: Secondary | ICD-10-CM | POA: Diagnosis not present

## 2023-10-07 DIAGNOSIS — R519 Headache, unspecified: Secondary | ICD-10-CM | POA: Diagnosis not present

## 2023-10-07 DIAGNOSIS — E1142 Type 2 diabetes mellitus with diabetic polyneuropathy: Secondary | ICD-10-CM | POA: Diagnosis not present

## 2023-10-07 DIAGNOSIS — M542 Cervicalgia: Secondary | ICD-10-CM | POA: Insufficient documentation

## 2023-10-07 DIAGNOSIS — M4802 Spinal stenosis, cervical region: Secondary | ICD-10-CM | POA: Diagnosis not present

## 2023-10-07 DIAGNOSIS — E039 Hypothyroidism, unspecified: Secondary | ICD-10-CM | POA: Insufficient documentation

## 2023-10-07 DIAGNOSIS — I700 Atherosclerosis of aorta: Secondary | ICD-10-CM | POA: Diagnosis not present

## 2023-10-07 DIAGNOSIS — I6200 Nontraumatic subdural hemorrhage, unspecified: Secondary | ICD-10-CM

## 2023-10-07 DIAGNOSIS — I10 Essential (primary) hypertension: Secondary | ICD-10-CM | POA: Diagnosis not present

## 2023-10-07 DIAGNOSIS — I6523 Occlusion and stenosis of bilateral carotid arteries: Secondary | ICD-10-CM | POA: Diagnosis not present

## 2023-10-07 DIAGNOSIS — I6503 Occlusion and stenosis of bilateral vertebral arteries: Secondary | ICD-10-CM | POA: Diagnosis not present

## 2023-10-07 DIAGNOSIS — S065X0A Traumatic subdural hemorrhage without loss of consciousness, initial encounter: Secondary | ICD-10-CM | POA: Diagnosis not present

## 2023-10-07 LAB — COMPREHENSIVE METABOLIC PANEL WITH GFR
ALT: 31 U/L (ref 0–44)
AST: 39 U/L (ref 15–41)
Albumin: 4.6 g/dL (ref 3.5–5.0)
Alkaline Phosphatase: 78 U/L (ref 38–126)
Anion gap: 11 (ref 5–15)
BUN: 26 mg/dL — ABNORMAL HIGH (ref 8–23)
CO2: 20 mmol/L — ABNORMAL LOW (ref 22–32)
Calcium: 9 mg/dL (ref 8.9–10.3)
Chloride: 108 mmol/L (ref 98–111)
Creatinine, Ser: 1.13 mg/dL (ref 0.61–1.24)
GFR, Estimated: >60 mL/min (ref >60)
Glucose, Bld: 167 mg/dL — ABNORMAL HIGH (ref 70–99)
Potassium: 4.1 mmol/L (ref 3.5–5.1)
Sodium: 139 mmol/L (ref 135–145)
Total Bilirubin: 0.5 mg/dL (ref 0.0–1.2)
Total Protein: 7.5 g/dL (ref 6.5–8.1)

## 2023-10-07 LAB — CBC
HCT: 36 % — ABNORMAL LOW (ref 39.0–52.0)
Hemoglobin: 11.5 g/dL — ABNORMAL LOW (ref 13.0–17.0)
MCH: 29.5 pg (ref 26.0–34.0)
MCHC: 31.9 g/dL (ref 30.0–36.0)
MCV: 92.3 fL (ref 80.0–100.0)
Platelets: 208 K/uL (ref 150–400)
RBC: 3.9 MIL/uL — ABNORMAL LOW (ref 4.22–5.81)
RDW: 15 % (ref 11.5–15.5)
WBC: 9.2 K/uL (ref 4.0–10.5)
nRBC: 0 % (ref 0.0–0.2)

## 2023-10-07 LAB — SEDIMENTATION RATE: Sed Rate: 29 mm/h — ABNORMAL HIGH (ref 0–20)

## 2023-10-07 MED ORDER — IBUPROFEN 600 MG PO TABS
600.0000 mg | ORAL_TABLET | Freq: Once | ORAL | Status: AC
  Administered 2023-10-07: 600 mg via ORAL
  Filled 2023-10-07: qty 1

## 2023-10-07 MED ORDER — GABAPENTIN 100 MG PO CAPS
200.0000 mg | ORAL_CAPSULE | ORAL | Status: AC
  Administered 2023-10-07: 200 mg via ORAL
  Filled 2023-10-07: qty 2

## 2023-10-07 MED ORDER — IOHEXOL 350 MG/ML SOLN
75.0000 mL | Freq: Once | INTRAVENOUS | Status: AC | PRN
Start: 2023-10-07 — End: 2023-10-07
  Administered 2023-10-07: 75 mL via INTRAVENOUS

## 2023-10-07 MED ORDER — ACETAMINOPHEN 500 MG PO TABS
1000.0000 mg | ORAL_TABLET | ORAL | Status: AC
  Administered 2023-10-07: 1000 mg via ORAL
  Filled 2023-10-07: qty 2

## 2023-10-07 NOTE — ED Triage Notes (Signed)
 Pt to ED via POV from home. Pt reports some mild right sided head pain all day and reports around 4pm the pain got worse. Pt reports trying to turn his head to the right feels nauseas and pain increases. No vision changes or numbness/tingling.   Pt reports last Monday got cataracts.

## 2023-10-07 NOTE — ED Provider Notes (Signed)
 [facility] Provider Note    Event Date/Time   First MD Initiated Contact with Patient 10/07/23 2129     (approximate)   History   Headache   HPI  Nicholas Humphrey is a 73 y.o. male with a history of COPD, diabetic neuropathy as well as arthritis and type 2 diabetes and hypothyroidism   Had his cataracts replaced about 2 weeks ago.  Has been doing well, went to lunch at [employer] around 5 PM started having a pain over the right side of his scalp around and behind the right ear.  No falls or injuries.  No numbness weakness difficulty speaking trouble walking but reports the headache is quite present he is very sore to touch and it creates a sharp sort of lancinating pain around the back of the right ear and now some into the right mid neck.  It is worsened by turning the head side-to-side and shoots out from the area behind the right ear.  No pain in the middle the neck.  He has not experienced this type of pain before.  It is worse when he turns his head  No visual changes.  No eye pain no abdominal pain no chest pain  Physical Exam   Triage Vital Signs: ED Triage Vitals  Encounter Vitals Group     BP 10/07/23 1653 (!) 161/72     Girls Systolic BP Percentile --      Girls Diastolic BP Percentile --      Boys Systolic BP Percentile --      Boys Diastolic BP Percentile --      Pulse Rate 10/07/23 1653 90     Resp 10/07/23 1653 20     Temp 10/07/23 1653 98 F (36.7 C)     Temp Source 10/07/23 1653 Oral     SpO2 10/07/23 1653 96 %     Weight 10/07/23 1654 227 lb 1.2 oz (103 kg)     Height 10/07/23 1654 5' 10 (1.778 m)     Head Circumference --      Peak Flow --      Pain Score 10/07/23 1653 10     Pain Loc --      Pain Education --      Exclude from Growth Chart --     Most recent vital signs: Vitals:   10/07/23 2330 10/07/23 2349  BP:  (!) 140/73  Pulse: 80 80  Resp:  18  Temp:  97.8 F (36.6 C)  SpO2: 98% 99%      General: Awake, no distress.  Normocephalic atraumatic.  Has area of point tenderness behind the right auricle but no swelling or edema.  The tympanic membrane on the right is normal.  He denies any changes in hearing.  Oropharynx widely patent no evidence of abnormality tonsillar mass or neck mass.  Anterior neck and lateral neck appear normal to inspection but he does have tenderness at the area of the insertion of the trapezius and along the right occiput and some along the right temporal artery region as well but normal temporal artery pulsations bilateral CV:  Good peripheral perfusion.  Normal tones and rate Resp:  Normal effort.  Clear bilateral Abd:  No distention.  Soft nontender not Other:  5-5 strength out of all extremities normal sensation all extremities and face.  Cranial nerves normal extraocular movements normal.  No pronator drift in extremity   ED Results / Procedures / Treatments   Labs (  all labs ordered are listed, but only abnormal results are displayed) Labs Reviewed  CBC - Abnormal; Notable for the following components:      Result Value   RBC 3.90 (*)    Hemoglobin 11.5 (*)    HCT 36.0 (*)    All other components within normal limits  COMPREHENSIVE METABOLIC PANEL WITH GFR - Abnormal; Notable for the following components:   CO2 20 (*)    Glucose, Bld 167 (*)    BUN 26 (*)    All other components within normal limits  SEDIMENTATION RATE - Abnormal; Notable for the following components:   Sed Rate 29 (*)    All other components within normal limits   Labs reviewed very mild anemia.  Minimal elevated glucose and known type II diabetic.  No evidence of acute electrolyte abnormality  EKG  Inter by me at 2250 heart rate 80 QRS 90 QTc 420, normal sinus rhythm no evidence of acute ischemia   RADIOLOGY   CT head without contrast inter by me as grossly normal  CT angiography head and neck pending, oncoming provider to  follow-up   PROCEDURES:  Critical Care performed: No  Procedures   MEDICATIONS ORDERED IN ED: Medications  acetaminophen  (TYLENOL ) tablet 1,000 mg (1,000 mg Oral Given 10/07/23 2209)  ibuprofen  (ADVIL ) tablet 600 mg (600 mg Oral Given 10/07/23 2209)  gabapentin  (NEURONTIN ) capsule 200 mg (200 mg Oral Given 10/07/23 2209)  iohexol  (OMNIPAQUE ) 350 MG/ML injection 75 mL (75 mLs Intravenous Contrast Given 10/07/23 2308)     IMPRESSION / MDM / ASSESSMENT AND PLAN / ED COURSE  I reviewed the triage vital signs and the nursing notes.                              Differential diagnosis includes but is not exclusive to subarachnoid hemorrhage, meningitis, encephalitis, previous head trauma, cavernous venous thrombosis, muscle tension headache, temporal arteritis, migraine or migraine equivalent, etc.  Pain is fairly reproducible to palpation along the superior neck up towards the mastoid.  Question if it could be musculoskeletal but given his age and concerns of his rather acute abrupt new onset CT head is performed which does not demonstrate acute intracranial hemorrhage thankfully.  He does not have focal neurologic symptoms.  Consideration for deeper or more carotid type pathology is given though no obvious symptoms of carotid dissection.  Check ESR to rule out giant cell somewhat low pretest probability.  No ocular symptoms  Patient reports he saw his ophthalmologist earlier today and they told him all was well  Will treat with Tylenol  ibuprofen  gabapentin  which patient reports he has had success with treatments in the past though not for the same  Reassuring neurologic exam.  No acute cardiopulmonary symptoms.   Patient's presentation is most consistent with acute complicated illness / injury requiring diagnostic workup.  Lab work reassuring.  ESR some mild elevation but not greater than 50 and with low pretest probability for acute arteritis I think this is fairly well excluded at this  juncture.    Clinical Course as of 10/08/23 0008  Tue Oct 07, 2023  2329 Received signout: Ho recent cataract surgery. Pain over right mastoid region, positional. No neuro deficits. Location of pain not concerning for arteritis. Dmii,  [HD]    Clinical Course User Index [HD] Nicholaus Rolland BRAVO, MD   Does not seem to have high risk for central venous thrombosis denies pulsating fullness or  component and seems to have more of a lancinating almost neuropathic like component to the pain.  I think if his symptoms are controlled and his imaging including CTA head and neck is reassuring and ESR normal that he could reasonably follow-up outpatient with careful return precautions around headache, if imaging is normal musculoskeletal cause would be much higher.  No midline cervical tenderness on exam I do not see clear evidence of radicular distribution   Ongoing disposition assigned oncoming provider.  Follow-up on CT angiography head and neck and reassessment.  If CT angiography without acute concerning pathology I think most likely patient could be discharged with close follow-up pending reassessment  FINAL CLINICAL IMPRESSION(S) / ED DIAGNOSES   Final diagnoses:  Acute nonintractable headache, unspecified headache type     Rx / DC Orders   ED Discharge Orders     None        Note:  This document was prepared using Dragon voice recognition software and may include unintentional dictation errors.   Dicky Anes, MD 10/08/23 [phone]

## 2023-10-08 ENCOUNTER — Emergency Department

## 2023-10-08 ENCOUNTER — Telehealth: Payer: Self-pay | Admitting: Neurosurgery

## 2023-10-08 DIAGNOSIS — R519 Headache, unspecified: Secondary | ICD-10-CM | POA: Diagnosis not present

## 2023-10-08 DIAGNOSIS — I6200 Nontraumatic subdural hemorrhage, unspecified: Secondary | ICD-10-CM | POA: Diagnosis not present

## 2023-10-08 DIAGNOSIS — M4802 Spinal stenosis, cervical region: Secondary | ICD-10-CM | POA: Diagnosis not present

## 2023-10-08 DIAGNOSIS — M542 Cervicalgia: Secondary | ICD-10-CM | POA: Diagnosis not present

## 2023-10-08 MED ORDER — METHYLPREDNISOLONE 4 MG PO TBPK: 8.0000 mg | ORAL_TABLET | Freq: Every evening | ORAL | Status: DC

## 2023-10-08 MED ORDER — METHYLPREDNISOLONE 4 MG PO TBPK: 4.0000 mg | ORAL_TABLET | ORAL | Status: DC

## 2023-10-08 MED ORDER — METHYLPREDNISOLONE 4 MG PO TBPK: 4.0000 mg | ORAL_TABLET | Freq: Four times a day (QID) | ORAL | Status: DC

## 2023-10-08 MED ORDER — GADOBUTROL 1 MMOL/ML IV SOLN
10.0000 mL | Freq: Once | INTRAVENOUS | Status: AC | PRN
  Administered 2023-10-08: 10 mL via INTRAVENOUS

## 2023-10-08 MED ORDER — METHYLPREDNISOLONE 4 MG PO TBPK
8.0000 mg | ORAL_TABLET | Freq: Every morning | ORAL | Status: AC
  Administered 2023-10-08: 8 mg via ORAL
  Filled 2023-10-08: qty 21

## 2023-10-08 MED ORDER — METHYLPREDNISOLONE 4 MG PO TBPK: 4.0000 mg | ORAL_TABLET | Freq: Three times a day (TID) | ORAL | Status: DC

## 2023-10-08 MED ORDER — METHYLPREDNISOLONE 4 MG PO TBPK: ORAL_TABLET | ORAL | 0 refills | Status: DC

## 2023-10-08 NOTE — Consult Note (Signed)
 [facility]  Stat Consult  Patient Name:   Nicholas Humphrey, Nicholas Humphrey Date of Birth:   [birth year] Identification Number:   MRN - [id number] Date of Service:   10/08/2023 00:15:13  Diagnosis:       R51.9 - Headache, unspecified  Impression Nicholas Humphrey is a 73 y.o. man with a history of diabetes, hypothyroidism, COPD, neuropathy, arthritis who presents to the ER for evaluation of R sided head pain that started all of a sudden at 1600. He has a sharp pain over the right side of his scalp, behind the right ear. The pain goes down to the right side of his neck. The right side of his head is very tender to touch. If he keeps still, he feels ok. But, if he turns his head or coughs, the right side of his head feels like it will explode. This has never happened to him before. He rubbed Biofreeze on his head earlier tonight and it did not help. He has not had any runny nose or tearing of the right eye. Denies any numbness/weakness, change in speech, or change in vision. Denies history of headaches. NIHSS 0. Non contrast head CT shows no acute findings. 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 Differential diagnosis includes occipital neuralgia (with pain over distribution of the lesser occipital nerve) vs cervicogenic headache.   Recommendations: - MRI brain with and without contrast - MRI C-spine with and without contrast - patient may need an occipital nerve block to help with diagnosis/treatment of occipital neuralgia   Recommendations: Our recommendations are outlined below.  Diagnostic Studies : MRI of brain with and without contrast  Disposition : Neurology will follow    ----------------------------------------------------------------------------------------------------    Metrics: Dispatch Time: 10/08/2023  00:13:38 Callback Response Time: 10/08/2023 00:15:33  Primary Provider Notified of Diagnostic Impression and Management Plan on: 10/08/2023 00:57:56   CT HEAD: I personally reviewed all the CT images that were available to me and it showed: no signs of acute ischemia or acute hemorrhage. I personally reviewed the CTA head/neck. 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    ----------------------------------------------------------------------------------------------------  Chief Complaint: right sided head pain  History of Present Illness: Patient is a 73 year old Male. Nicholas Humphrey is a 73 y.o. man with a history of diabetes, hypothyroidism, COPD, neuropathy, arthritis who presents to the ER for evaluation of R sided head pain that started all of a sudden at 1600. He has a sharp pain over the right side of his scalp, behind the right ear. The pain goes down to the right side of his neck. The right side of his head is very tender to touch. If he keeps still, he feels ok. But, if he turns his head or coughs, the right side of his head feels like it will explode. This has never happened to him before. He rubbed Biofreeze on his head earlier tonight and it did not help. He has not had any runny nose or tearing of the right eye. Denies any numbness/weakness, change in speech, or change in vision. Denies history of headaches.        Past Medical History: Other PMH:  diabetes, hypothyroidism, COPD, neuropathy, arthritis  Medications:  No Anticoagulant use  Antiplatelet use: Yes aspirin  81 mg daily Reviewed  EMR for current medications  Allergies:  Reviewed Description: Pregabalin, oxycodone , Crestor, Doxycycline, Lipitor, Tramadol   Social History: Smoking: No Alcohol  Use: No  Family History:  There is no family history of premature cerebrovascular disease pertinent to this  consultation  ROS : 14 Points Review of Systems was performed and was negative except mentioned in HPI.  Past Surgical History: There Is No Surgical History Contributory To Today's Visit    Examination: BP(140/73), Pulse(80), 1A: Level of Consciousness - Alert; keenly responsive + 0 1B: Ask Month and Age - Both Questions Right + 0 1C: Blink Eyes & Squeeze Hands - Performs Both Tasks + 0 2: Test Horizontal Extraocular Movements - Normal + 0 3: Test Visual Fields - No Visual Loss + 0 4: Test Facial Palsy (Use Grimace if Obtunded) - Normal symmetry + 0 5A: Test Left Arm Motor Drift - No Drift for 10 Seconds + 0 5B: Test Right Arm Motor Drift - No Drift for 10 Seconds + 0 6A: Test Left Leg Motor Drift - No Drift for 5 Seconds + 0 6B: Test Right Leg Motor Drift - No Drift for 5 Seconds + 0 7: Test Limb Ataxia (FNF/Heel-Shin) - No Ataxia + 0 8: Test Sensation - Normal; No sensory loss + 0 9: Test Language/Aphasia - Normal; No aphasia + 0 10: Test Dysarthria - Normal + 0 11: Test Extinction/Inattention - No abnormality + 0  NIHSS Score: 0  Spoke with : Dr. Nicholaus    This consult was conducted in real time using interactive audio and Immunologist. Patient was informed of the technology being used for this visit and agreed to proceed. Patient located in hospital and provider located at home/office setting.  Patient is being evaluated for possible acute neurologic impairment and high probability of imminent or life - threatening deterioration.I spent total of 35 minutes providing care to this patient, including time for face to face visit via telemedicine, review of medical records, imaging studies and discussion of findings with providers, the patient and / or family.   Dr Michalene Debruler     [facility] For Inpatient follow-up with [facility] physician please call [facility] at [phone]. As we are not an outpatient service for any post hospital discharge needs please  contact the hospital for assistance.  If you have any questions for the [facility] physicians or need to reconsult for clinical or diagnostic changes please contact us  via [facility] at [phone].   Signature : Raygen Dahm

## 2023-10-08 NOTE — Telephone Encounter (Signed)
 [name] is calling to schedule a follow up appointment for her husband. He was seen in the ER for a Subdural hemorrhage. Does he need any repeat imaging prior to seeing our office?

## 2023-10-08 NOTE — ED Notes (Signed)
 This RN was in for assist with teleneuro

## 2023-10-08 NOTE — Discharge Instructions (Addendum)
 You were seen in the emergency department for an atypical headache.  You had an extensive workup and there was concern that you may have a very small subdural hematoma.  Please stay away from anything that may thin the blood including aspirin  and ibuprofen  for the next week.  Please call and make an appointment with your neurosurgeon; states that your case was discussed in the emergency department and Dr. Claudene and wants to you to be seen in the next week..  You can use Tylenol  and your Medrol  Dosepak may help.  Return with any acutely worsening symptoms or any other emergency. -- RETURN PRECAUTIONS & AFTERCARE: (ENGLISH) RETURN PRECAUTIONS: Return immediately to the emergency department or see/call your doctor if you feel worse, weak or have changes in speech or vision, are short of breath, have fever, vomiting, pain, bleeding or dark stool, trouble urinating or any new issues. Return here or see/call your doctor if not improving as expected for your suspected condition. FOLLOW-UP CARE: Call your doctor and/or any doctors we referred you to for more advice and to make an appointment. Do this today, tomorrow or after the weekend. Some doctors only take PPO insurance so if you have HMO insurance you may want to contact your HMO or your regular doctor for referral to a specialist within your plan. Either way tell the doctor's office that it was a referral from the emergency department so you get the soonest possible appointment.  YOUR TEST RESULTS: Take result reports of any blood or urine tests, imaging tests and EKG's to your doctor and any referral doctor. Have any abnormal tests repeated. Your doctor or a referral doctor can let you know when this should be done. Also make sure your doctor contacts this hospital to get any test results that are not currently available such as cultures or special tests for infection and final imaging reports, which are often not available at the time you leave the ER but which  may list additional important findings that are not documented on the preliminary report. BLOOD PRESSURE: If your blood pressure was greater than 120/80 have your blood pressure rechecked within 1 to 2 weeks. MEDICATION SIDE EFFECTS: Do not drive, walk, bike, take the bus, etc. if you have received or are being prescribed any sedating medications such as those for pain or anxiety or certain antihistamines like Benadryl. If you have been give one of these here get a taxi home or have a friend drive you home. Ask your pharmacist to counsel you on potential side effects of any new medication

## 2023-10-08 NOTE — ED Provider Notes (Signed)
 Clinical Course as of 10/08/23 0531  Tue Oct 07, 2023  2329 Received signout: Ho recent cataract surgery. Pain over right mastoid region, positional. No neuro deficits. Location of pain not concerning for arteritis. Dmii,  [HD]  Wed Oct 08, 2023  0011 Pain has always been on the right side and is positional.  Given the severe proximal V2 stenosis.  Will reach out to teleneurology [HD]  [phone] Touch base with teleneurology and they recommended MRI brain with and without contrast and MRI C-spine with and without contrast.  If this is unremarkable can proceed with an occipital nerve block [HD]  0326 I received a call from radiologist and there is concern that there may be a small subdural on the MRI brain.  He does think this was present on the initial CT head as well.  Will reach out to neurosurgery [HD]  0336 Talked with Dr. Claudene.  He agrees that this is an atypical presentation but it is very small.  He says obtain a repeat CT head and if it is stable he is safe to go home without any aspirin  or ibuprofen  and to follow-up in the clinic within a week. [HD]  [phone] Patient updated with course of care [HD]  0404 Dr. Claudene states that if patient's continues to have headache, starting him on steroids might help [HD]  0511 CT Head Wo Contrast No worsening on the CT head.  Will send the patient home with a Medrol  Dosepak and follow-up with neurosurgery.  He will return if any acutely worsening symptoms [HD]    Clinical Course User Index [HD] Nicholaus Rolland BRAVO, MD   .Critical Care  Performed by: Nicholaus Rolland BRAVO, MD Authorized by: Nicholaus Rolland BRAVO, MD   Critical care provider statement:    Critical care time (minutes):  35   Critical care was necessary to treat or prevent imminent or life-threatening deterioration of the following conditions:  CNS failure or compromise   Critical care was time spent personally by me on the following activities:  Development of treatment plan with patient or surrogate,  discussions with consultants, evaluation of patient's response to treatment, examination of patient, ordering and review of laboratory studies, ordering and review of radiographic studies, ordering and performing treatments and interventions, pulse oximetry, re-evaluation of patient's condition and review of old charts  Diagnosis: Subdural hematoma, headache  At time of discharge there is no evidence of acute life, limb, vision, or fertility threat. Patient has stable vital signs, pain is well controlled, patient is ambulatory and p.o. tolerant.  Discharge instructions were completed using the EPIC system. I would refer you to those at this time. All warnings prescriptions follow-up etc. were discussed in detail with the patient. Patient indicates understanding and is agreeable with this plan. All questions answered.  Patient is made aware that they may return to the emergency department for any worsening or new condition or for any other emergency.    Nicholaus Rolland BRAVO, MD 10/08/23 [phone]

## 2023-10-09 ENCOUNTER — Other Ambulatory Visit: Payer: Self-pay

## 2023-10-09 DIAGNOSIS — S065XAA Traumatic subdural hemorrhage with loss of consciousness status unknown, initial encounter: Secondary | ICD-10-CM

## 2023-10-09 NOTE — Telephone Encounter (Signed)
 Patient scheduled for CT on 09/23 and an appointment with Dr.Smith on 09/24.

## 2023-10-09 NOTE — Telephone Encounter (Signed)
Order placed, please schedule

## 2023-10-09 NOTE — Telephone Encounter (Signed)
 [name] transferred to scheduling and is advised to call our office back once theCT is scheduled.

## 2023-10-10 DIAGNOSIS — J301 Allergic rhinitis due to pollen: Secondary | ICD-10-CM | POA: Diagnosis not present

## 2023-10-13 ENCOUNTER — Encounter: Payer: Self-pay | Admitting: Anesthesiology

## 2023-10-13 ENCOUNTER — Ambulatory Visit: Admission: RE | Admit: 2023-10-13 | Source: Home / Self Care | Admitting: Ophthalmology

## 2023-10-13 ENCOUNTER — Telehealth: Payer: Self-pay | Admitting: Neurosurgery

## 2023-10-13 SURGERY — PHACOEMULSIFICATION, CATARACT, WITH IOL INSERTION
Anesthesia: Topical | Laterality: Left

## 2023-10-13 NOTE — Progress Notes (Unsigned)
 Referring Physician:  Don Lauraine Collar, NP [address] [place],  [place] [address]  Primary Physician:  Don Lauraine Collar, NP  History of Present Illness: 10/15/2023 Nicholas Humphrey is here today with a chief complaint of spontaneous subdural Gustavo.  He was seen in the emergency department and had an extensive workup including CT angio of his head and neck, MRI of the brain and C-spine as well as follow-up CTs until he had a stable head CT.  He did not have any trauma however he does have very violent coughs and sneezes according to his spouse.  He states that they are so violent that it will often shake his entire body.  He has coughed like this and sneeze like this for many years.  He does get some continued ringing in his right ear and he also gets some pain behind his right ear in the mastoid region.    The symptoms are causing a significant impact on the patient's life.   I have utilized the care everywhere function in epic to review the outside records available from external health systems.  Review of Systems:  A 10 point review of systems is negative, except for the pertinent positives and negatives detailed in the HPI.  Past Medical History: Past Medical History:  Diagnosis Date   Allergic rhinitis due to pollen    Arthritis    Chronic bronchitis (HCC)    COPD (chronic obstructive pulmonary disease) (HCC)    Diabetic neuropathy (HCC)    Fall from ladder 2018   LANDED ON CONCRETE   GERD (gastroesophageal reflux disease)    Hypercholesteremia    Hypertension    Hypothyroidism    Idiopathic gout    Obesity (BMI 30-39.9)    PONV (postoperative nausea and vomiting)    Type 2 diabetes mellitus with diabetic neuropathy, with long-term current use of insulin  (HCC)    Wears contact lenses     Past Surgical History: Past Surgical History:  Procedure Laterality Date   APPENDECTOMY     BACK SURGERY  2019   CARPAL TUNNEL RELEASE Right 08/18/2019    Procedure: ENDOSCOPIC RIGHT CARPAL TUNNEL RELEASE , RELEASE RIGHT LONG TRIGGER FINGER. INDEX TRIGGER AND TRIGGER THUMB RELEASE;  Surgeon: Edie Norleen PARAS, MD;  Location: [facility];  Service: Orthopedics;  Laterality: Right;   CATARACT EXTRACTION W/PHACO Right 09/29/2023   Procedure: PHACOEMULSIFICATION, CATARACT, WITH IOL  6.60 00:45.9;  Surgeon: Myrna Adine Anes, MD;  Location: [facility];  Service: Ophthalmology;  Laterality: Right;   COLONOSCOPY WITH PROPOFOL  N/A 03/30/2021   Procedure: COLONOSCOPY WITH PROPOFOL ;  Surgeon: Maryruth Ole DASEN, MD;  Location: [facility];  Service: Endoscopy;  Laterality: N/A;  IDDM   ELBOW SURGERY Left 2018   X2   FRACTURE SURGERY Left    SEVERAL BROKEN BONES AFTER FALL   HAND SURGERY Left 2018   HARDWARE REMOVED   HAND SURGERY Left 2018   HARDWARE AFTER FALL   INGUINAL HERNIA REPAIR Right    KNEE ARTHROSCOPY W/ PARTIAL MEDIAL MENISCECTOMY Left 08/17/2010   LUMBAR LAMINECTOMY     ROTATOR CUFF REPAIR Right 2013   TRIGGER FINGER RELEASE Right 10/04/2015   Procedure: RELEASE TRIGGER FINGER/A-1 PULLEY;  Surgeon: Norleen PARAS Edie, MD;  Location: [facility];  Service: Orthopedics;  Laterality: Right;  PREFER BIER BLOCK Diabetic - insulin  and oral meds   TYMPANOPLASTY Right     Allergies: Allergies as of 10/15/2023 - Review Complete 10/15/2023  Allergen Reaction Noted  Pregabalin Swelling 12/05/2016   Oxycodone  Nausea And Vomiting 07/03/2017   Crestor [rosuvastatin]  09/27/2015   Doxycycline Nausea Only 02/05/2018   Lipitor [atorvastatin]  09/27/2015   Tramadol  Anxiety 12/05/2016    Medications:  Current Outpatient Medications:    allopurinol (ZYLOPRIM) 100 MG tablet, Take 100 mg by mouth daily., Disp: , Rfl:    colchicine 0.6 MG tablet, , Disp: , Rfl:    Continuous Blood Gluc Sensor (FREESTYLE LIBRE 2 SENSOR) MISC, , Disp: , Rfl:    ELDERBERRY PO, Take 1 capsule by mouth daily., Disp: , Rfl:    EPINEPHrine  0.3 mg/0.3 mL IJ SOAJ  injection, Inject into the muscle., Disp: , Rfl:    insulin  aspart (NOVOLOG  FLEXPEN) 100 UNIT/ML FlexPen, Novolog  FlexPen U-100 Insulin , Disp: , Rfl:    insulin  degludec (TRESIBA ) 200 UNIT/ML FlexTouch Pen, Inject 124 Units into the skin daily. , Disp: , Rfl:    levothyroxine  (SYNTHROID ) 137 MCG tablet, Take 137 mcg by mouth daily., Disp: , Rfl:    loratadine (CLARITIN) 10 MG tablet, Take 10 mg by mouth daily., Disp: , Rfl:    losartan-hydrochlorothiazide (HYZAAR) 50-12.5 MG tablet, Take 1 tablet by mouth daily., Disp: , Rfl:    metFORMIN (GLUCOPHAGE) 1000 MG tablet, Take 1,000-1,500 mg by mouth See admin instructions. 1000 mg in AM. 1500 mg in PM., Disp: , Rfl:    methylPREDNISolone  (MEDROL  DOSEPAK) 4 MG TBPK tablet, Methylprednisone 4 mg Dosepak for 6 days with the 6th day ending at 4mg , Disp: 21 tablet, Rfl: 0   MOUNJARO 7.5 MG/0.5ML Pen, SMARTSIG:0.5 Milliliter(s) SUB-Q Once a Week, Disp: , Rfl:    Multiple Vitamin (MULTIVITAMIN) tablet, Take 1 tablet by mouth daily., Disp: , Rfl:    pantoprazole  (PROTONIX ) 40 MG tablet, Take 40 mg by mouth 2 (two) times daily., Disp: , Rfl:    pramipexole (MIRAPEX) 0.5 MG tablet, Take 0.5 mg by mouth daily., Disp: , Rfl:    simvastatin  (ZOCOR ) 40 MG tablet, Take 40 mg by mouth at bedtime. , Disp: , Rfl:    tamsulosin  (FLOMAX ) 0.4 MG CAPS capsule, Take 1 capsule (0.4 mg total) by mouth daily., Disp: 90 capsule, Rfl: 3   Vibegron  (GEMTESA ) 75 MG TABS, Take 1 tablet (75 mg total) by mouth daily., Disp: 90 tablet, Rfl: 2   vitamin B-12 (CYANOCOBALAMIN ) 500 MCG tablet, Take 500 mcg by mouth daily., Disp: , Rfl:    aspirin  81 MG tablet, Take 81 mg by mouth daily. (Patient not taking: Reported on 10/15/2023), Disp: , Rfl:   Social History: Social History   Tobacco Use   Smoking status: Never   Smokeless tobacco: Never  Vaping Use   Vaping status: Never Used  Substance Use Topics   Alcohol  use: Never   Drug use: Never    Family Medical History: Family  History  Problem Relation Age of Onset   Prostate cancer Father    Bladder Cancer Neg Hx    Kidney cancer Neg Hx     Physical Examination: Vitals:   10/15/23 0916  BP: 138/70    General: Patient is in no apparent distress. Attention to examination is appropriate.  Neck:   Supple.  Full range of motion.  Respiratory: Patient is breathing without any difficulty.   NEUROLOGICAL:     Awake, alert, oriented to person, place, and time.  Speech is clear and fluent.   Cranial Nerves: Pupils equal round and reactive to light.  Facial tone is symmetric.  Facial sensation is symmetric. Shoulder shrug  is symmetric. Tongue protrusion is midline.    Strength: No major deficits noted  Gait is normal.    Imaging: Narrative & Impression  CLINICAL DATA:  Follow-up examination for subdural hematoma.   EXAM: CT HEAD WITHOUT CONTRAST   TECHNIQUE: Contiguous axial images were obtained from the base of the skull through the vertex without intravenous contrast.   RADIATION DOSE REDUCTION: This exam was performed according to the departmental dose-optimization program which includes automated exposure control, adjustment of the mA and/or kV according to patient size and/or use of iterative reconstruction technique.   COMPARISON:  Comparison made with CT and MRI from 10/08/2023.   FINDINGS: Brain: Again seen is slight focal thickening along the right posterior falx, similar to perhaps slightly less prominent as compared to previous exam. While this could reflect an underlying area of focal dural thickening, a possible trace resolving subdural hematoma remains difficult to exclude. No significant mass effect.   No other acute intracranial hemorrhage. No acute large vessel territory infarct. No mass lesion or midline shift. No hydrocephalus.   Vascular: No abnormal hyperdense vessel. Scattered vascular calcifications noted within the carotid siphons.   Skull: Scalp soft tissues  demonstrate no acute finding. Calvarium intact.   Sinuses/Orbits: Globes orbital soft tissues within normal limits. Paranasal sinuses are largely clear. Chronic opacification with osseous erosion at the inferior left mastoid air cells protruding into the left EAC (series 3, image 15). 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 Appearance is similar.   Other: None.   IMPRESSION: 1. Slight focal thickening along the right posterior falx, similar to perhaps slightly less prominent as compared to previous exam. While this could reflect an underlying area of focal dural thickening, a possible trace residual subdural hematoma remains difficult to exclude. No significant mass effect. 2. No other acute intracranial abnormality. 3. Chronic opacification with osseous erosion at the inferior left mastoid air cells protruding into the left EAC. 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 Appearance is similar.     Electronically Signed   By: Morene Hoard M.D.   On: 10/14/2023 20:18    CLINICAL DATA:  Right sided headache; Right sided pain.  EXAM: MRI HEAD WITHOUT AND WITH CONTRAST  MRI CERVICAL SPINE WITHOUT AND WITH CONTRAST  CONTRAST:  10mL GADAVIST  GADOBUTROL  1 MMOL/ML IV SOLN  TECHNIQUE: ...   Study Result  Narrative & Impression  CLINICAL DATA:  Right sided headache; Right sided pain.   EXAM: MRI HEAD WITHOUT AND WITH CONTRAST   MRI CERVICAL SPINE WITHOUT AND WITH CONTRAST   CONTRAST:  10mL GADAVIST  GADOBUTROL  1 MMOL/ML IV SOLN   TECHNIQUE: Multiplanar, multiecho pulse sequences of the brain and surrounding structures, and cervical and thoracic spine were obtained without and with intravenous contrast.   COMPARISON:  None Available.   CT head 10/07/2023   FINDINGS: MRI HEAD FINDINGS   Brain: Trace FLAIR hyperintensity with mild  restricted diffusion and susceptibility artifact along the posterior right falx, suggestive of trace subdural hemorrhage. No mass effect. No evidence of acute infarct, mass lesion, midline shift or hydrocephalus.   Vascular: Normal flow voids.   Skull and upper cervical spine: Normal marrow signal.   Sinuses/Orbits: Negative.   MRI CERVICAL SPINE FINDINGS   Alignment: Straightening.   Vertebrae: No fracture, evidence of discitis, or bone lesion.   Cord: Normal cord signal.   Posterior Fossa, vertebral arteries, paraspinal tissues: Negative.   Disc levels:  C2-C3: No significant disc protrusion, foraminal stenosis, or canal stenosis.   C3-C4: Bilateral facet and uncovertebral hypertrophy contributes to moderate left and mild right foraminal stenosis. Patent canal.   C4-C5: Posterior disc osteophyte complex with bilateral facet and uncovertebral hypertrophy. Resulting severe bilateral foraminal stenosis and moderate canal stenosis.   C5-C6: Posterior disc osteophyte complex and ligamentum flavum thickening. Bilateral facet hypertrophy. Resulting moderate to severe right greater than left foraminal stenosis. Mild to moderate canal stenosis.   C6-C7: Posterior disc osteophyte complex and left greater than right facet and uncovertebral hypertrophy. Resulting moderate left and mild right foraminal stenosis. Patent canal.   C7-T1: No significant disc protrusion, foraminal stenosis, or canal stenosis.   IMPRESSION: MRI head:   Suspected trace subdural hemorrhage along the posterior right falx which in retrospect was likely present on recent CT head. No mass effect.   MRI cervical spine:   1. At C4-C5, severe bilateral foraminal stenosis and moderate canal stenosis. 2. At C5-C6, moderate to severe right greater than left foraminal stenosis and mild to moderate canal stenosis. 3. At C3-C4 and C6-C7, moderate left and mild right foraminal stenosis.   Findings  discussed with Dr. Nicholaus via telephone at 3:11 AM     Electronically Signed   By: Gilmore GORMAN Molt M.D.   On: 10/08/2023 03:26     Narrative & Impression  CLINICAL DATA:  Right sided headache; Right sided pain.   EXAM: MRI HEAD WITHOUT AND WITH CONTRAST   MRI CERVICAL SPINE WITHOUT AND WITH CONTRAST   CONTRAST:  10mL GADAVIST  GADOBUTROL  1 MMOL/ML IV SOLN   TECHNIQUE: Multiplanar, multiecho pulse sequences of the brain and surrounding structures, and cervical and thoracic spine were obtained without and with intravenous contrast.   COMPARISON:  None Available.   CT head 10/07/2023   FINDINGS: MRI HEAD FINDINGS   Brain: Trace FLAIR hyperintensity with mild restricted diffusion and susceptibility artifact along the posterior right falx, suggestive of trace subdural hemorrhage. No mass effect. No evidence of acute infarct, mass lesion, midline shift or hydrocephalus.   Vascular: Normal flow voids.   Skull and upper cervical spine: Normal marrow signal.   Sinuses/Orbits: Negative.   MRI CERVICAL SPINE FINDINGS   Alignment: Straightening.   Vertebrae: No fracture, evidence of discitis, or bone lesion.   Cord: Normal cord signal.   Posterior Fossa, vertebral arteries, paraspinal tissues: Negative.   Disc levels:   C2-C3: No significant disc protrusion, foraminal stenosis, or canal stenosis.   C3-C4: Bilateral facet and uncovertebral hypertrophy contributes to moderate left and mild right foraminal stenosis. Patent canal.   C4-C5: Posterior disc osteophyte complex with bilateral facet and uncovertebral hypertrophy. Resulting severe bilateral foraminal stenosis and moderate canal stenosis.   C5-C6: Posterior disc osteophyte complex and ligamentum flavum thickening. Bilateral facet hypertrophy. Resulting moderate to severe right greater than left foraminal stenosis. Mild to moderate canal stenosis.   C6-C7: Posterior disc osteophyte complex and left greater  than right facet and uncovertebral hypertrophy. Resulting moderate left and mild right foraminal stenosis. Patent canal.   C7-T1: No significant disc protrusion, foraminal stenosis, or canal stenosis.   IMPRESSION: MRI head:   Suspected trace subdural hemorrhage along the posterior right falx which in retrospect was likely present on recent CT head. No mass effect.   MRI cervical spine:   1. At C4-C5, severe bilateral foraminal stenosis and moderate canal stenosis. 2. At C5-C6, moderate to severe right greater than left foraminal stenosis and mild to moderate canal stenosis. 3. At C3-C4  and C6-C7, moderate left and mild right foraminal stenosis.   Findings discussed with Dr. Nicholaus via telephone at 3:11 AM     Electronically Signed   By: Gilmore GORMAN Molt M.D.   On: 10/08/2023 03:26     Narrative & Impression  CLINICAL DATA:  Neck trauma, intoxicated or obtunded (Age >= 16y) no trauma, ACUTE R posterior superior neck vs R lateral skull pain   EXAM: CT ANGIOGRAPHY HEAD AND NECK WITH AND WITHOUT CONTRAST   TECHNIQUE: Multidetector CT imaging of the head and neck was performed using the standard protocol during bolus administration of intravenous contrast. Multiplanar CT image reconstructions and MIPs were obtained to evaluate the vascular anatomy. Carotid stenosis measurements (when applicable) are obtained utilizing NASCET criteria, using the distal internal carotid diameter as the denominator.   RADIATION DOSE REDUCTION: This exam was performed according to the departmental dose-optimization program which includes automated exposure control, adjustment of the mA and/or kV according to patient size and/or use of iterative reconstruction technique.   CONTRAST:  75mL OMNIPAQUE  IOHEXOL  350 MG/ML SOLN   COMPARISON:  Same day CT head.   FINDINGS: CTA NECK FINDINGS   Aortic arch: Aortic atherosclerosis. Great vessel origins are patent.   Right carotid system:  Atherosclerosis involving the carotid bifurcation and the proximal ICA with approximately 50% stenosis of the proximal ICA.   Left carotid system: Atherosclerosis involving the proximal ICA and carotid bifurcation without greater than 50% stenosis.   Vertebral arteries: Left dominant. Patent bilaterally. 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 Additional severe proximal left V2 vertebral artery stenosis.   Skeleton: No acute abnormality on limited assessment. Severe multilevel degenerative change of the cervical spine including degenerative disc disease, endplate spurring, facet and uncovertebral hypertrophy with varying degrees of neural foraminal stenosis.   Other neck: No acute abnormality on limited assessment.   Upper chest: Lung apices are clear.   Review of the MIP images confirms the above findings   CTA HEAD FINDINGS   Anterior circulation: Bilateral intracranial ICAs, MCAs, and ACAs are patent without proximal hemodynamically significant stenosis.   Posterior circulation: Bilateral intradural vertebral arteries, basilar artery, and bilateral posterior shortage are patent.   Venous sinuses: As permitted by contrast timing, patent.   Review of the MIP images confirms the above findings   IMPRESSION: 1. No emergent large vessel occlusion. 2. 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 Additional severe proximal left V2 vertebral artery stenosis. 3. Approximately 50% stenosis of the proximal right ICA. 4. Aortic Atherosclerosis (ICD10-I70.0).     Electronically Signed   By: Gilmore GORMAN Molt M.D.   On: 10/07/2023 23:56     I have personally reviewed the images and agree with the above interpretation.  Medical Decision Making/Assessment and Plan: [name] is a pleasant 73 y.o. male with Recent spontaneous subdural hematoma.  It was apparently nontraumatic.  However he does have a history of severe coughing and  sneezing spells which will shake his entire body.  He is not having any new focal neurologic deficits.  He does have some headaches.  He also gets some radiation into his right ear.  He also gets some head ringing.  On his CT scan it does demonstrate some chronic appearing calcifications in his mastoid process.  I will plan to have his ENT follow-up on this as he is getting some ringing in his ears and pain behind his ear.  This does appear chronic however would like them to evaluate.  In a neurosurgical standpoint I do not see any clear compressive etiologies.  From a cranial standpoint it would be okay for him to resume his aspirin  if this is indicated by his primary care provider.  Will plan to follow-up.  In regards to his cervical spine he has a history of cervical spondylosis as well as chronic neck pain.  He has had injections before is not had any previous PT.  He is had a recent cervical MRI which demonstrated spondylosis.  In regards to his lumbar spine he has significant spondylosis, he has been followed by an outside surgeon but not recently.  He has not had any recent lumbar imaging or physical therapy.  In regards to his lumbar and cervical spine we will plan to have him start with physical therapy occupational therapy evaluation.  A referral has been made.  Will not plan on any new imaging until he can see us  back in approximately 3 months for follow-up.   Thank you for involving me in the care of this patient.    Penne MICAEL Sharps MD/MSCR Neurosurgery

## 2023-10-13 NOTE — Telephone Encounter (Signed)
 [name] called to find out if the patient needs to start the new prescription of prednisone  before he is seen by our office. She states that he was given prednisone  in the ER and another prescription was also sent to the pharmacy. She states it helps take the edge off the pain but does not take it away. Please advise.

## 2023-10-13 NOTE — Telephone Encounter (Signed)
 I spoke to patient's wife and she states she was give a prescription for Medrol  Dos pak in the ER and then there was a prescription sent to their pharmacy. They have take the Prednisone  from the pharmacy. I informed her to hold off on taking the 2nd prescription. He just finished the Medrol  [facility] yesterday. He is scheduled for Ct follow up on 9/24.

## 2023-10-14 ENCOUNTER — Ambulatory Visit
Admission: RE | Admit: 2023-10-14 | Discharge: 2023-10-14 | Disposition: A | Discharge status: Home / Self Care | Source: Ambulatory Visit | Attending: Neurosurgery | Admitting: Neurosurgery

## 2023-10-14 DIAGNOSIS — S065XAA Traumatic subdural hemorrhage with loss of consciousness status unknown, initial encounter: Secondary | ICD-10-CM | POA: Insufficient documentation

## 2023-10-14 DIAGNOSIS — I6200 Nontraumatic subdural hemorrhage, unspecified: Secondary | ICD-10-CM | POA: Diagnosis not present

## 2023-10-14 NOTE — Telephone Encounter (Signed)
 I called the reading room. The image has been escalated.

## 2023-10-15 ENCOUNTER — Ambulatory Visit (INDEPENDENT_AMBULATORY_CARE_PROVIDER_SITE_OTHER): Admitting: Neurosurgery

## 2023-10-15 VITALS — BP 138/70 | Ht 70.0 in | Wt 226.0 lb

## 2023-10-15 DIAGNOSIS — S065XAA Traumatic subdural hemorrhage with loss of consciousness status unknown, initial encounter: Secondary | ICD-10-CM | POA: Insufficient documentation

## 2023-10-15 DIAGNOSIS — I6200 Nontraumatic subdural hemorrhage, unspecified: Secondary | ICD-10-CM

## 2023-10-15 DIAGNOSIS — M47812 Spondylosis without myelopathy or radiculopathy, cervical region: Secondary | ICD-10-CM | POA: Diagnosis not present

## 2023-10-15 DIAGNOSIS — M47816 Spondylosis without myelopathy or radiculopathy, lumbar region: Secondary | ICD-10-CM

## 2023-10-15 DIAGNOSIS — M47817 Spondylosis without myelopathy or radiculopathy, lumbosacral region: Secondary | ICD-10-CM | POA: Insufficient documentation

## 2023-10-15 NOTE — Progress Notes (Deleted)
 ENT follow up for mastoidistis. Right side w/ some tinnuits.   No clea reas on for sdh    Neck and back PT   \ Email guager about asa?

## 2023-10-21 ENCOUNTER — Telehealth: Payer: Self-pay | Admitting: Neurosurgery

## 2023-10-21 ENCOUNTER — Encounter: Payer: Self-pay | Admitting: Ophthalmology

## 2023-10-21 NOTE — Telephone Encounter (Signed)
 Please see below message and advise.     Media Information  Document Information  AMB Correspondence  NEUROSURGERY ANSWERING SERVICE CALL  10/20/2023 08:12  Attached To:  Nicholas Humphrey  Source Information  Default, Provider, MD

## 2023-10-21 NOTE — Telephone Encounter (Signed)
 I placed this form on your desk, have you had a chance to look at this?

## 2023-10-22 NOTE — Telephone Encounter (Signed)
 Were you able to find this?

## 2023-10-22 NOTE — Anesthesia Preprocedure Evaluation (Addendum)
 Anesthesia Evaluation  Patient identified by MRN, date of birth, ID band Patient awake    Reviewed: Allergy & Precautions, H&P , NPO status , Patient's Chart, lab work & pertinent test results  History of Anesthesia Complications (+) PONV and history of anesthetic complications  Airway Mallampati: II  TM Distance: >3 FB Neck ROM: Full    Dental no notable dental hx.    Pulmonary neg pulmonary ROS, pneumonia, COPD   Pulmonary exam normal breath sounds clear to auscultation       Cardiovascular hypertension, negative cardio ROS Normal cardiovascular exam Rhythm:Regular Rate:Normal     Neuro/Psych negative neurological ROS  negative psych ROS   GI/Hepatic negative GI ROS, Neg liver ROS,GERD  ,,  Endo/Other  negative endocrine ROSdiabetesHypothyroidism    Renal/GU Renal diseasenegative Renal ROS  negative genitourinary   Musculoskeletal negative musculoskeletal ROS (+) Arthritis ,    Abdominal   Peds negative pediatric ROS (+)  Hematology negative hematology ROS (+)   Anesthesia Other Findings Previous cataract surgery Sept 2025 Dr. Ola   Hypertension             Hypercholesteremia GERD (gastroesophageal reflux disease)Hypothyroidism PONV (postoperative nausea and vomiting)Will administer zofran  4 mg IV preop Diabetic neuropathy (HCC) Wears contact lenses             Fall from ladder Arthritis             Idiopathic gout Type 2 diabetes mellitus with diabetic neuropathy, with long-term current use of insulin  (HCC)  Allergic rhinitis due to pollen Chronic bronchitis (HCC)         COPD (chronic obstructive pulmonary disease) (HCC) Obesity (BMI 30-39.9)        Reproductive/Obstetrics negative OB ROS                              Anesthesia Physical Anesthesia Plan  ASA: 3  Anesthesia Plan: MAC   Post-op Pain Management:    Induction: Intravenous  PONV Risk Score and  Plan:   Airway Management Planned: Natural Airway and Nasal Cannula  Additional Equipment:   Intra-op Plan:   Post-operative Plan:   Informed Consent: I have reviewed the patients History and Physical, chart, labs and discussed the procedure including the risks, benefits and alternatives for the proposed anesthesia with the patient or authorized representative who has indicated his/her understanding and acceptance.     Dental Advisory Given  Plan Discussed with: Anesthesiologist, CRNA and Surgeon  Anesthesia Plan Comments: (Patient consented for risks of anesthesia including but not limited to:  - adverse reactions to medications - damage to eyes, teeth, lips or other oral mucosa - nerve damage due to positioning  - sore throat or hoarseness - Damage to heart, brain, nerves, lungs, other parts of body or loss of life  Patient voiced understanding and assent.)         Anesthesia Quick Evaluation

## 2023-10-23 NOTE — Discharge Instructions (Signed)

## 2023-10-23 NOTE — Telephone Encounter (Signed)
 Noted

## 2023-10-24 DIAGNOSIS — J301 Allergic rhinitis due to pollen: Secondary | ICD-10-CM | POA: Diagnosis not present

## 2023-10-27 ENCOUNTER — Encounter: Payer: Self-pay | Admitting: Ophthalmology

## 2023-10-27 ENCOUNTER — Encounter
Admission: RE | Disposition: A | Discharge status: Home / Self Care | Payer: Self-pay | Source: Home / Self Care | Attending: Ophthalmology

## 2023-10-27 ENCOUNTER — Other Ambulatory Visit: Payer: Self-pay

## 2023-10-27 ENCOUNTER — Ambulatory Visit: Payer: Self-pay | Admitting: Anesthesiology

## 2023-10-27 ENCOUNTER — Ambulatory Visit
Admission: RE | Admit: 2023-10-27 | Discharge: 2023-10-27 | Disposition: A | Discharge status: Home / Self Care | Attending: Ophthalmology | Admitting: Ophthalmology

## 2023-10-27 DIAGNOSIS — H2512 Age-related nuclear cataract, left eye: Secondary | ICD-10-CM | POA: Insufficient documentation

## 2023-10-27 DIAGNOSIS — E1140 Type 2 diabetes mellitus with diabetic neuropathy, unspecified: Secondary | ICD-10-CM | POA: Diagnosis not present

## 2023-10-27 DIAGNOSIS — E039 Hypothyroidism, unspecified: Secondary | ICD-10-CM | POA: Diagnosis not present

## 2023-10-27 DIAGNOSIS — I10 Essential (primary) hypertension: Secondary | ICD-10-CM | POA: Insufficient documentation

## 2023-10-27 DIAGNOSIS — K219 Gastro-esophageal reflux disease without esophagitis: Secondary | ICD-10-CM | POA: Diagnosis not present

## 2023-10-27 DIAGNOSIS — J4489 Other specified chronic obstructive pulmonary disease: Secondary | ICD-10-CM | POA: Insufficient documentation

## 2023-10-27 DIAGNOSIS — Z794 Long term (current) use of insulin: Secondary | ICD-10-CM | POA: Diagnosis not present

## 2023-10-27 DIAGNOSIS — E1136 Type 2 diabetes mellitus with diabetic cataract: Secondary | ICD-10-CM | POA: Insufficient documentation

## 2023-10-27 DIAGNOSIS — Z79890 Hormone replacement therapy (postmenopausal): Secondary | ICD-10-CM | POA: Insufficient documentation

## 2023-10-27 DIAGNOSIS — J449 Chronic obstructive pulmonary disease, unspecified: Secondary | ICD-10-CM | POA: Diagnosis not present

## 2023-10-27 DIAGNOSIS — H25042 Posterior subcapsular polar age-related cataract, left eye: Secondary | ICD-10-CM | POA: Diagnosis present

## 2023-10-27 DIAGNOSIS — Z7984 Long term (current) use of oral hypoglycemic drugs: Secondary | ICD-10-CM | POA: Diagnosis not present

## 2023-10-27 HISTORY — PX: CATARACT EXTRACTION W/PHACO: SHX586

## 2023-10-27 LAB — GLUCOSE, CAPILLARY: Glucose-Capillary: 113 mg/dL — ABNORMAL HIGH (ref 70–99)

## 2023-10-27 SURGERY — PHACOEMULSIFICATION, CATARACT, WITH IOL INSERTION
Anesthesia: Monitor Anesthesia Care | Site: Eye | Laterality: Left

## 2023-10-27 MED ORDER — LIDOCAINE HCL (PF) 2 % IJ SOLN
INTRAOCULAR | Status: DC | PRN
  Administered 2023-10-27: 4 mL via INTRAOCULAR

## 2023-10-27 MED ORDER — SIGHTPATH DOSE#1 BSS IO SOLN
INTRAOCULAR | Status: DC | PRN
  Administered 2023-10-27: 99 mL via OPHTHALMIC

## 2023-10-27 MED ORDER — LACTATED RINGERS IV SOLN: INTRAVENOUS | Status: DC

## 2023-10-27 MED ORDER — FENTANYL CITRATE (PF) 100 MCG/2ML IJ SOLN
INTRAMUSCULAR | Status: AC
  Filled 2023-10-27: qty 2

## 2023-10-27 MED ORDER — MIDAZOLAM HCL 2 MG/2ML IJ SOLN
INTRAMUSCULAR | Status: DC | PRN
  Administered 2023-10-27: 2 mg via INTRAVENOUS

## 2023-10-27 MED ORDER — TETRACAINE HCL 0.5 % OP SOLN
1.0000 [drp] | OPHTHALMIC | Status: DC | PRN
  Administered 2023-10-27 (×3): 1 [drp] via OPHTHALMIC

## 2023-10-27 MED ORDER — FENTANYL CITRATE (PF) 100 MCG/2ML IJ SOLN
INTRAMUSCULAR | Status: DC | PRN
  Administered 2023-10-27: 50 ug via INTRAVENOUS

## 2023-10-27 MED ORDER — SIGHTPATH DOSE#1 NA HYALUR & NA CHOND-NA HYALUR IO KIT
PACK | INTRAOCULAR | Status: DC | PRN
  Administered 2023-10-27: 1 via OPHTHALMIC

## 2023-10-27 MED ORDER — MIDAZOLAM HCL 2 MG/2ML IJ SOLN
INTRAMUSCULAR | Status: AC
  Filled 2023-10-27: qty 2

## 2023-10-27 MED ORDER — ARMC OPHTHALMIC DILATING DROPS
1.0000 | OPHTHALMIC | Status: DC | PRN
  Administered 2023-10-27 (×3): 1 via OPHTHALMIC

## 2023-10-27 MED ORDER — TETRACAINE HCL 0.5 % OP SOLN
OPHTHALMIC | Status: AC
  Filled 2023-10-27: qty 4

## 2023-10-27 MED ORDER — SIGHTPATH DOSE#1 BSS IO SOLN
INTRAOCULAR | Status: DC | PRN
  Administered 2023-10-27: 15 mL via INTRAOCULAR

## 2023-10-27 MED ORDER — ARMC OPHTHALMIC DILATING DROPS
OPHTHALMIC | Status: AC
  Filled 2023-10-27: qty 0.5

## 2023-10-27 MED ORDER — MOXIFLOXACIN HCL 0.5 % OP SOLN
OPHTHALMIC | Status: DC | PRN
  Administered 2023-10-27: .2 mL via OPHTHALMIC

## 2023-10-27 SURGICAL SUPPLY — 9 items
DISSECTOR HYDRO NUCLEUS 50X22 (MISCELLANEOUS) ×1 IMPLANT
FEE CATARACT SUITE SIGHTPATH (MISCELLANEOUS) ×1 IMPLANT
GLOVE PI ULTRA LF STRL 7.5 (GLOVE) ×1 IMPLANT
GLOVE SURG SYN 6.5 PF PI BL (GLOVE) ×1 IMPLANT
GLOVE SURG SYN 8.5 PF PI BL (GLOVE) ×1 IMPLANT
LENS IOL TECNIS EYHANCE 17.5 (Intraocular Lens) IMPLANT
NDL FILTER BLUNT 18X1 1/2 (NEEDLE) ×1 IMPLANT
NEEDLE FILTER BLUNT 18X1 1/2 (NEEDLE) ×1 IMPLANT
SYR 3ML LL SCALE MARK (SYRINGE) ×1 IMPLANT

## 2023-10-27 NOTE — Op Note (Signed)
 OPERATIVE NOTE  Nicholas Humphrey [id number] 10/27/2023   PREOPERATIVE DIAGNOSIS:  Nuclear sclerotic cataract left eye.  H25.12   POSTOPERATIVE DIAGNOSIS:    Nuclear sclerotic cataract left eye.     PROCEDURE:  Phacoemusification with posterior chamber intraocular lens placement of the left eye   LENS:   Implant Name Type Inv. Item Serial No. Manufacturer Lot No. LRB No. Used Action  LENS IOL TECNIS EYHANCE 17.5 - [id number] Intraocular Lens LENS IOL TECNIS EYHANCE 17.5 [id number] [employer]  Left 1 Implanted      Procedure(s): PHACOEMULSIFICATION, CATARACT, WITH IOL  4.54 00:36.0 (Left)  SURGEON:  Adine Novak, MD, MPH   ANESTHESIA:  Topical with tetracaine  drops augmented with 1% preservative-free intracameral lidocaine .  ESTIMATED BLOOD LOSS: <1 mL   COMPLICATIONS:  None.   DESCRIPTION OF PROCEDURE:  The patient was identified in the holding room and transported to the operating room and placed in the supine position under the operating microscope.  The left eye was identified as the operative eye and it was prepped and draped in the usual sterile ophthalmic fashion.   A 1.0 millimeter clear-corneal paracentesis was made at the 5:00 position. 0.5 ml of preservative-free 1% lidocaine  with epinephrine  was injected into the anterior chamber.  The anterior chamber was filled with viscoelastic.  A 2.4 millimeter keratome was used to make a near-clear corneal incision at the 2:00 position.  A curvilinear capsulorrhexis was made with a cystotome and capsulorrhexis forceps.  Balanced salt solution was used to hydrodissect and hydrodelineate the nucleus.   Phacoemulsification was then used in stop and chop fashion to remove the lens nucleus and epinucleus.  The remaining cortex was then removed using the irrigation and aspiration handpiece. Viscoelastic was then placed into the capsular bag to distend it for lens placement.  A lens was then injected into the capsular bag.  The remaining  viscoelastic was aspirated.   Wounds were hydrated with balanced salt solution.  The anterior chamber was inflated to a physiologic pressure with balanced salt solution.   Intracameral vigamox  0.1 mL undiltued was injected into the eye and a drop placed onto the ocular surface.  No wound leaks were noted.  The patient was taken to the recovery room in stable condition without complications of anesthesia or surgery  Adine Novak 10/27/2023, 7:50 AM

## 2023-10-27 NOTE — Transfer of Care (Signed)
 Immediate Anesthesia Transfer of Care Note  Patient: Nicholas Humphrey  Procedure(s) Performed: PHACOEMULSIFICATION, CATARACT, WITH IOL  4.54 00:36.0 (Left: Eye)  Patient Location: PACU  Anesthesia Type: MAC  Level of Consciousness: awake, alert  and patient cooperative  Airway and Oxygen Therapy: Patient Spontanous Breathing and Patient connected to supplemental oxygen  Post-op Assessment: Post-op Vital signs reviewed, Patient's Cardiovascular Status Stable, Respiratory Function Stable, Patent Airway and No signs of Nausea or vomiting  Post-op Vital Signs: Reviewed and stable  Complications: No notable events documented.

## 2023-10-27 NOTE — Anesthesia Postprocedure Evaluation (Signed)
 Anesthesia Post Note  Patient: Charlie KATHEE Georges  Procedure(s) Performed: PHACOEMULSIFICATION, CATARACT, WITH IOL  4.54 00:36.0 (Left: Eye)  Patient location during evaluation: PACU Anesthesia Type: MAC Level of consciousness: awake and alert Pain management: pain level controlled Vital Signs Assessment: post-procedure vital signs reviewed and stable Respiratory status: spontaneous breathing, nonlabored ventilation, respiratory function stable and patient connected to nasal cannula oxygen Cardiovascular status: stable and blood pressure returned to baseline Postop Assessment: no apparent nausea or vomiting Anesthetic complications: no   No notable events documented.   Last Vitals:  Vitals:   10/27/23 0752 10/27/23 0756  BP: 126/60 (!) 131/57  Pulse:  78  Resp: 13 15  Temp: 36.6 C   SpO2:  100%    Last Pain:  Vitals:   10/27/23 0756  PainSc: 0-No pain                 Esmay Amspacher C Wafaa Deemer

## 2023-10-27 NOTE — H&P (Signed)
 [facility]   Primary Care Physician:  Don Lauraine Collar, NP Ophthalmologist: Dr. Adine Novak  Pre-Procedure History & Physical: HPI:  Nicholas Humphrey is a 73 y.o. male here for cataract surgery.   Past Medical History:  Diagnosis Date   Allergic rhinitis due to pollen    Arthritis    Chronic bronchitis (HCC)    COPD (chronic obstructive pulmonary disease) (HCC)    Diabetic neuropathy (HCC)    Fall from ladder 2018   LANDED ON CONCRETE   GERD (gastroesophageal reflux disease)    Hypercholesteremia    Hypertension    Hypothyroidism    Idiopathic gout    Obesity (BMI 30-39.9)    PONV (postoperative nausea and vomiting)    Type 2 diabetes mellitus with diabetic neuropathy, with long-term current use of insulin  (HCC)    Wears contact lenses     Past Surgical History:  Procedure Laterality Date   APPENDECTOMY     BACK SURGERY  2019   CARPAL TUNNEL RELEASE Right 08/18/2019   Procedure: ENDOSCOPIC RIGHT CARPAL TUNNEL RELEASE , RELEASE RIGHT LONG TRIGGER FINGER. INDEX TRIGGER AND TRIGGER THUMB RELEASE;  Surgeon: Edie Norleen PARAS, MD;  Location: [facility];  Service: Orthopedics;  Laterality: Right;   CATARACT EXTRACTION W/PHACO Right 09/29/2023   Procedure: PHACOEMULSIFICATION, CATARACT, WITH IOL  6.60 00:45.9;  Surgeon: Novak Adine Anes, MD;  Location: [facility];  Service: Ophthalmology;  Laterality: Right;   COLONOSCOPY WITH PROPOFOL  N/A 03/30/2021   Procedure: COLONOSCOPY WITH PROPOFOL ;  Surgeon: Maryruth Ole DASEN, MD;  Location: [facility];  Service: Endoscopy;  Laterality: N/A;  IDDM   ELBOW SURGERY Left 2018   X2   FRACTURE SURGERY Left    SEVERAL BROKEN BONES AFTER FALL   HAND SURGERY Left 2018   HARDWARE REMOVED   HAND SURGERY Left 2018   HARDWARE AFTER FALL   INGUINAL HERNIA REPAIR Right    KNEE ARTHROSCOPY W/ PARTIAL MEDIAL MENISCECTOMY Left 08/17/2010   LUMBAR LAMINECTOMY     ROTATOR CUFF REPAIR Right 2013   TRIGGER FINGER RELEASE Right  10/04/2015   Procedure: RELEASE TRIGGER FINGER/A-1 PULLEY;  Surgeon: Norleen PARAS Edie, MD;  Location: [facility];  Service: Orthopedics;  Laterality: Right;  PREFER BIER BLOCK Diabetic - insulin  and oral meds   TYMPANOPLASTY Right     Prior to Admission medications   Medication Sig Start Date End Date Taking? Authorizing Provider  allopurinol (ZYLOPRIM) 100 MG tablet Take 100 mg by mouth daily. 10/04/20  Yes [provider]  colchicine 0.6 MG tablet  10/17/20  Yes [provider]  ELDERBERRY PO Take 1 capsule by mouth daily.   Yes [provider]  insulin  aspart (NOVOLOG  FLEXPEN) 100 UNIT/ML FlexPen Novolog  FlexPen U-100 Insulin    Yes [provider]  insulin  degludec (TRESIBA ) 200 UNIT/ML FlexTouch Pen Inject 124 Units into the skin daily.    Yes [provider]  levothyroxine  (SYNTHROID ) 137 MCG tablet Take 137 mcg by mouth daily.   Yes [provider]  loratadine (CLARITIN) 10 MG tablet Take 10 mg by mouth daily.   Yes [provider]  losartan-hydrochlorothiazide (HYZAAR) 50-12.5 MG tablet Take 1 tablet by mouth daily.   Yes [provider]  metFORMIN (GLUCOPHAGE) 1000 MG tablet Take 1,000-1,500 mg by mouth See admin instructions. 1000 mg in AM. 1500 mg in PM.   Yes [provider]  MOUNJARO 7.5 MG/0.5ML Pen SMARTSIG:0.5 Milliliter(s) SUB-Q Once a Week   Yes [provider]  Multiple Vitamin (MULTIVITAMIN) tablet Take 1 tablet by mouth daily.   Yes [provider]  pantoprazole  (PROTONIX ) 40 MG tablet Take 40 mg by mouth 2 (two) times daily. 11/15/20  Yes [provider]  pramipexole (MIRAPEX) 0.5 MG tablet Take 0.5 mg by mouth daily.   Yes [provider]  simvastatin  (ZOCOR ) 40 MG tablet Take 40 mg by mouth at bedtime.    Yes [provider]  tamsulosin  (FLOMAX ) 0.4 MG CAPS capsule Take 1 capsule (0.4 mg total) by mouth daily. 07/03/23  Yes Stoioff, Glendia BROCKS, MD   Vibegron  (GEMTESA ) 75 MG TABS Take 1 tablet (75 mg total) by mouth daily. 09/16/23  Yes Stoioff, Glendia BROCKS, MD  vitamin B-12 (CYANOCOBALAMIN ) 500 MCG tablet Take 500 mcg by mouth daily.   Yes [provider]  aspirin  81 MG tablet Take 81 mg by mouth daily. Patient not taking: Reported on 10/15/2023    [provider]  Continuous Blood Gluc Sensor (FREESTYLE LIBRE 2 SENSOR) MISC  12/11/20   [provider]  EPINEPHrine  0.3 mg/0.3 mL IJ SOAJ injection Inject into the muscle. 12/01/20   [provider]  methylPREDNISolone  (MEDROL  DOSEPAK) 4 MG TBPK tablet Methylprednisone 4 mg Dosepak for 6 days with the 6th day ending at 4mg  10/08/23   Nicholaus Rolland BRAVO, MD    Allergies as of 10/17/2023 - Review Complete 10/15/2023  Allergen Reaction Noted   Pregabalin Swelling 12/05/2016   Oxycodone  Nausea And Vomiting 07/03/2017   Crestor [rosuvastatin]  09/27/2015   Doxycycline Nausea Only 02/05/2018   Lipitor [atorvastatin]  09/27/2015   Tramadol  Anxiety 12/05/2016    Family History  Problem Relation Age of Onset   Prostate cancer Father    Bladder Cancer Neg Hx    Kidney cancer Neg Hx     Social History   Socioeconomic History   Marital status: Married    Spouse name: Not on file   Number of children: Not on file   Years of education: Not on file   Highest education level: Not on file  Occupational History   Not on file  Tobacco Use   Smoking status: Never   Smokeless tobacco: Never  Vaping Use   Vaping status: Never Used  Substance and Sexual Activity   Alcohol  use: Never   Drug use: Never   Sexual activity: Yes    Birth control/protection: None  Other Topics Concern   Not on file  Social History Narrative   Not on file   Social Drivers of Health   Financial Resource Strain: Medium Risk (09/22/2023)   Received from [facility]   Overall Financial Resource Strain (CARDIA)    Difficulty of Paying Living Expenses: Somewhat hard   Food Insecurity: No Food Insecurity (09/22/2023)   Received from [facility]   Hunger Vital Sign    Within the past 12 months, you worried that your food would run out before you got the money to buy more.: Never true    Within the past 12 months, the food you bought just didn't last and you didn't have money to get more.: Never true  Transportation Needs: No Transportation Needs (09/22/2023)   Received from [facility] - Transportation    In the past 12 months, has lack of transportation kept you from medical appointments or from getting medications?: No    Lack of Transportation (Non-Medical): No  Physical Activity: Insufficiently Active (01/07/2023)   Received from [facility]   Exercise Vital Sign    On average, how many minutes do you engage in exercise at this level?: 30 min    On average, how many days per week do you engage in moderate to strenuous exercise (like a brisk walk)?: 4 days  Stress: Stress Concern Present (01/07/2023)   Received from [facility] - Occupational Stress Questionnaire    Feeling of Stress : To some extent  Social Connections: Socially Integrated (01/07/2023)   Received from [facility]   Social Connection and Isolation Panel    In a typical week, how many times do you talk on the phone with family, friends, or neighbors?: More than three times a week    How often do you get together with friends or relatives?: More than three times a week    How often do you attend church or religious services?: More than 4 times per year    Do you belong to any clubs or organizations such as church groups, unions, fraternal or athletic groups, or school groups?: Yes    How often do you attend meetings of the clubs or organizations you belong to?: More than 4 times per year    Are you married, widowed, divorced, separated, never  married, or living with a partner?: Married  Intimate Partner Violence: Not on file    Review of Systems: See HPI, otherwise negative ROS  Physical Exam: BP (!) 159/78   Pulse 82   Temp 97.6 F (36.4 C)   Wt 103 kg   SpO2 96%   BMI 32.57 kg/m  General:   Alert, cooperative. Head:  Normocephalic and atraumatic. Respiratory:  Normal work of breathing. Cardiovascular:  NAD  Impression/Plan: Nicholas Humphrey is here for cataract surgery.  Risks, benefits, limitations, and alternatives regarding cataract surgery have been reviewed with the patient.  Questions have been answered.  All parties agreeable.   Adine Novak, MD  10/27/2023, 7:15 AM

## 2023-10-28 ENCOUNTER — Encounter: Payer: Self-pay | Admitting: Ophthalmology

## 2023-10-31 DIAGNOSIS — M25512 Pain in left shoulder: Secondary | ICD-10-CM | POA: Diagnosis not present

## 2023-10-31 DIAGNOSIS — J301 Allergic rhinitis due to pollen: Secondary | ICD-10-CM | POA: Diagnosis not present

## 2023-10-31 DIAGNOSIS — M25511 Pain in right shoulder: Secondary | ICD-10-CM | POA: Diagnosis not present

## 2023-10-31 DIAGNOSIS — M542 Cervicalgia: Secondary | ICD-10-CM | POA: Diagnosis not present

## 2023-10-31 DIAGNOSIS — M5459 Other low back pain: Secondary | ICD-10-CM | POA: Diagnosis not present

## 2023-11-03 DIAGNOSIS — M5459 Other low back pain: Secondary | ICD-10-CM | POA: Diagnosis not present

## 2023-11-03 DIAGNOSIS — M542 Cervicalgia: Secondary | ICD-10-CM | POA: Diagnosis not present

## 2023-11-03 DIAGNOSIS — M25512 Pain in left shoulder: Secondary | ICD-10-CM | POA: Diagnosis not present

## 2023-11-03 DIAGNOSIS — M25511 Pain in right shoulder: Secondary | ICD-10-CM | POA: Diagnosis not present

## 2023-11-05 DIAGNOSIS — M542 Cervicalgia: Secondary | ICD-10-CM | POA: Diagnosis not present

## 2023-11-05 DIAGNOSIS — M5459 Other low back pain: Secondary | ICD-10-CM | POA: Diagnosis not present

## 2023-11-05 DIAGNOSIS — M25511 Pain in right shoulder: Secondary | ICD-10-CM | POA: Diagnosis not present

## 2023-11-05 DIAGNOSIS — M25512 Pain in left shoulder: Secondary | ICD-10-CM | POA: Diagnosis not present

## 2023-11-07 DIAGNOSIS — J301 Allergic rhinitis due to pollen: Secondary | ICD-10-CM | POA: Diagnosis not present

## 2023-11-10 DIAGNOSIS — H903 Sensorineural hearing loss, bilateral: Secondary | ICD-10-CM | POA: Diagnosis not present

## 2023-11-10 DIAGNOSIS — H6123 Impacted cerumen, bilateral: Secondary | ICD-10-CM | POA: Diagnosis not present

## 2023-11-12 DIAGNOSIS — M25512 Pain in left shoulder: Secondary | ICD-10-CM | POA: Diagnosis not present

## 2023-11-12 DIAGNOSIS — M542 Cervicalgia: Secondary | ICD-10-CM | POA: Diagnosis not present

## 2023-11-12 DIAGNOSIS — M5459 Other low back pain: Secondary | ICD-10-CM | POA: Diagnosis not present

## 2023-11-12 DIAGNOSIS — M25511 Pain in right shoulder: Secondary | ICD-10-CM | POA: Diagnosis not present

## 2023-11-14 DIAGNOSIS — M25511 Pain in right shoulder: Secondary | ICD-10-CM | POA: Diagnosis not present

## 2023-11-14 DIAGNOSIS — M25512 Pain in left shoulder: Secondary | ICD-10-CM | POA: Diagnosis not present

## 2023-11-14 DIAGNOSIS — M542 Cervicalgia: Secondary | ICD-10-CM | POA: Diagnosis not present

## 2023-11-14 DIAGNOSIS — J301 Allergic rhinitis due to pollen: Secondary | ICD-10-CM | POA: Diagnosis not present

## 2023-11-14 DIAGNOSIS — M5459 Other low back pain: Secondary | ICD-10-CM | POA: Diagnosis not present

## 2023-11-19 DIAGNOSIS — M5459 Other low back pain: Secondary | ICD-10-CM | POA: Diagnosis not present

## 2023-11-19 DIAGNOSIS — M25512 Pain in left shoulder: Secondary | ICD-10-CM | POA: Diagnosis not present

## 2023-11-19 DIAGNOSIS — M25511 Pain in right shoulder: Secondary | ICD-10-CM | POA: Diagnosis not present

## 2023-11-19 DIAGNOSIS — M542 Cervicalgia: Secondary | ICD-10-CM | POA: Diagnosis not present

## 2023-11-21 DIAGNOSIS — M25512 Pain in left shoulder: Secondary | ICD-10-CM | POA: Diagnosis not present

## 2023-11-21 DIAGNOSIS — M25511 Pain in right shoulder: Secondary | ICD-10-CM | POA: Diagnosis not present

## 2023-11-21 DIAGNOSIS — M5459 Other low back pain: Secondary | ICD-10-CM | POA: Diagnosis not present

## 2023-11-21 DIAGNOSIS — M542 Cervicalgia: Secondary | ICD-10-CM | POA: Diagnosis not present

## 2023-11-26 DIAGNOSIS — M542 Cervicalgia: Secondary | ICD-10-CM | POA: Diagnosis not present

## 2023-11-26 DIAGNOSIS — M25511 Pain in right shoulder: Secondary | ICD-10-CM | POA: Diagnosis not present

## 2023-11-26 DIAGNOSIS — M5459 Other low back pain: Secondary | ICD-10-CM | POA: Diagnosis not present

## 2023-11-26 DIAGNOSIS — M25512 Pain in left shoulder: Secondary | ICD-10-CM | POA: Diagnosis not present

## 2023-11-28 DIAGNOSIS — J301 Allergic rhinitis due to pollen: Secondary | ICD-10-CM | POA: Diagnosis not present

## 2023-12-01 DIAGNOSIS — M25512 Pain in left shoulder: Secondary | ICD-10-CM | POA: Diagnosis not present

## 2023-12-01 DIAGNOSIS — M25511 Pain in right shoulder: Secondary | ICD-10-CM | POA: Diagnosis not present

## 2023-12-01 DIAGNOSIS — M542 Cervicalgia: Secondary | ICD-10-CM | POA: Diagnosis not present

## 2023-12-01 DIAGNOSIS — M5459 Other low back pain: Secondary | ICD-10-CM | POA: Diagnosis not present

## 2023-12-04 DIAGNOSIS — J301 Allergic rhinitis due to pollen: Secondary | ICD-10-CM | POA: Diagnosis not present

## 2023-12-12 DIAGNOSIS — J301 Allergic rhinitis due to pollen: Secondary | ICD-10-CM | POA: Diagnosis not present

## 2023-12-15 DIAGNOSIS — J42 Unspecified chronic bronchitis: Secondary | ICD-10-CM | POA: Diagnosis not present

## 2023-12-15 DIAGNOSIS — J4540 Moderate persistent asthma, uncomplicated: Secondary | ICD-10-CM | POA: Diagnosis not present

## 2023-12-15 DIAGNOSIS — K219 Gastro-esophageal reflux disease without esophagitis: Secondary | ICD-10-CM | POA: Diagnosis not present

## 2023-12-15 DIAGNOSIS — J3081 Allergic rhinitis due to animal (cat) (dog) hair and dander: Secondary | ICD-10-CM | POA: Diagnosis not present

## 2023-12-24 DIAGNOSIS — E1122 Type 2 diabetes mellitus with diabetic chronic kidney disease: Secondary | ICD-10-CM | POA: Diagnosis not present

## 2023-12-24 DIAGNOSIS — E782 Mixed hyperlipidemia: Secondary | ICD-10-CM | POA: Diagnosis not present

## 2023-12-24 DIAGNOSIS — Z1331 Encounter for screening for depression: Secondary | ICD-10-CM | POA: Diagnosis not present

## 2023-12-24 DIAGNOSIS — E538 Deficiency of other specified B group vitamins: Secondary | ICD-10-CM | POA: Diagnosis not present

## 2023-12-24 DIAGNOSIS — Z794 Long term (current) use of insulin: Secondary | ICD-10-CM | POA: Diagnosis not present

## 2023-12-24 DIAGNOSIS — M1A0720 Idiopathic chronic gout, left ankle and foot, without tophus (tophi): Secondary | ICD-10-CM | POA: Diagnosis not present

## 2023-12-24 DIAGNOSIS — Z978 Presence of other specified devices: Secondary | ICD-10-CM | POA: Diagnosis not present

## 2023-12-24 DIAGNOSIS — Z79899 Other long term (current) drug therapy: Secondary | ICD-10-CM | POA: Diagnosis not present

## 2023-12-24 DIAGNOSIS — J449 Chronic obstructive pulmonary disease, unspecified: Secondary | ICD-10-CM | POA: Diagnosis not present

## 2023-12-24 DIAGNOSIS — G2581 Restless legs syndrome: Secondary | ICD-10-CM | POA: Diagnosis not present

## 2023-12-24 DIAGNOSIS — K219 Gastro-esophageal reflux disease without esophagitis: Secondary | ICD-10-CM | POA: Diagnosis not present

## 2023-12-24 DIAGNOSIS — I10 Essential (primary) hypertension: Secondary | ICD-10-CM | POA: Diagnosis not present

## 2023-12-24 DIAGNOSIS — N1831 Chronic kidney disease, stage 3a: Secondary | ICD-10-CM | POA: Diagnosis not present

## 2023-12-24 DIAGNOSIS — I700 Atherosclerosis of aorta: Secondary | ICD-10-CM | POA: Diagnosis not present

## 2023-12-24 DIAGNOSIS — E039 Hypothyroidism, unspecified: Secondary | ICD-10-CM | POA: Diagnosis not present

## 2023-12-29 ENCOUNTER — Other Ambulatory Visit: Payer: Self-pay | Admitting: *Deleted

## 2023-12-29 DIAGNOSIS — N401 Enlarged prostate with lower urinary tract symptoms: Secondary | ICD-10-CM

## 2023-12-29 MED ORDER — GEMTESA 75 MG PO TABS: 75.0000 mg | ORAL_TABLET | Freq: Every day | ORAL | 3 refills | Status: AC

## 2024-01-19 ENCOUNTER — Ambulatory Visit: Admitting: Neurosurgery

## 2024-01-19 ENCOUNTER — Ambulatory Visit

## 2024-01-19 VITALS — BP 176/80 | Ht 70.0 in | Wt 225.5 lb

## 2024-01-19 DIAGNOSIS — M47812 Spondylosis without myelopathy or radiculopathy, cervical region: Secondary | ICD-10-CM

## 2024-01-19 NOTE — Progress Notes (Signed)
 "   Referring Physician:  Don Lauraine Collar, NP [address] [place],  [place] [address]  Primary Physician:  Don Lauraine Collar, NP  Discussed the use of AI scribe software for clinical note transcription with the patient, who gave verbal consent to proceed.  History of Present Illness Nicholas Humphrey is a 73 year old male with cervical spondylosis, myelomalacia, and sequelae of spinal cord injury who presents for evaluation of chronic neck pain and tightness.  He has had chronic neck pain and tightness for about six years after a 25-foot fall that caused a significant spinal cord injury with an 63-month recovery.  He describes persistent neck tightness with intermittent discomfort and shooting pain behind the ear when he rotates his head to the right. Symptoms worsen later in the day and at night. He does not view the pain as severe and feels he has adapted. He denies new neurological deficits, including worsening gait or upper extremity dysfunction.  Physical therapy has helped, and he continues home exercises. Prior injections to the neck, shoulders, and lower back improved pain. His lower back pain was previously worse than his neck pain, but he feels his symptoms are now stable.  I have utilized the care everywhere function in epic to review the outside records available from external health systems.  Review of Systems:  A 10 point review of systems is negative, except for the pertinent positives and negatives detailed in the HPI.  Past Medical History: Past Medical History:  Diagnosis Date   Allergic rhinitis due to pollen    Arthritis    Chronic bronchitis (HCC)    COPD (chronic obstructive pulmonary disease) (HCC)    Diabetic neuropathy (HCC)    Fall from ladder 2018   LANDED ON CONCRETE   GERD (gastroesophageal reflux disease)    Hypercholesteremia    Hypertension    Hypothyroidism    Idiopathic gout    Obesity (BMI 30-39.9)    PONV (postoperative nausea and  vomiting)    Type 2 diabetes mellitus with diabetic neuropathy, with long-term current use of insulin  (HCC)    Wears contact lenses     Past Surgical History: Past Surgical History:  Procedure Laterality Date   APPENDECTOMY     BACK SURGERY  2019   CARPAL TUNNEL RELEASE Right 08/18/2019   Procedure: ENDOSCOPIC RIGHT CARPAL TUNNEL RELEASE , RELEASE RIGHT LONG TRIGGER FINGER. INDEX TRIGGER AND TRIGGER THUMB RELEASE;  Surgeon: Edie Norleen PARAS, MD;  Location: [facility];  Service: Orthopedics;  Laterality: Right;   CATARACT EXTRACTION W/PHACO Right 09/29/2023   Procedure: PHACOEMULSIFICATION, CATARACT, WITH IOL  6.60 00:45.9;  Surgeon: Myrna Adine Anes, MD;  Location: [facility];  Service: Ophthalmology;  Laterality: Right;   CATARACT EXTRACTION W/PHACO Left 10/27/2023   Procedure: PHACOEMULSIFICATION, CATARACT, WITH IOL  4.54 00:36.0;  Surgeon: Myrna Adine Anes, MD;  Location: [facility];  Service: Ophthalmology;  Laterality: Left;   COLONOSCOPY WITH PROPOFOL  N/A 03/30/2021   Procedure: COLONOSCOPY WITH PROPOFOL ;  Surgeon: Maryruth Ole DASEN, MD;  Location: [facility];  Service: Endoscopy;  Laterality: N/A;  IDDM   ELBOW SURGERY Left 2018   X2   FRACTURE SURGERY Left    SEVERAL BROKEN BONES AFTER FALL   HAND SURGERY Left 2018   HARDWARE REMOVED   HAND SURGERY Left 2018   HARDWARE AFTER FALL   INGUINAL HERNIA REPAIR Right    KNEE ARTHROSCOPY W/ PARTIAL MEDIAL MENISCECTOMY Left 08/17/2010   LUMBAR LAMINECTOMY  ROTATOR CUFF REPAIR Right 2013   TRIGGER FINGER RELEASE Right 10/04/2015   Procedure: RELEASE TRIGGER FINGER/A-1 PULLEY;  Surgeon: Norleen JINNY Maltos, MD;  Location: [facility];  Service: Orthopedics;  Laterality: Right;  PREFER BIER BLOCK Diabetic - insulin  and oral meds   TYMPANOPLASTY Right     Allergies: Allergies as of 01/19/2024 - Review Complete 10/27/2023  Allergen Reaction Noted   Pregabalin Swelling 12/05/2016   Oxycodone  Nausea And Vomiting  07/03/2017   Crestor [rosuvastatin]  09/27/2015   Doxycycline Nausea Only 02/05/2018   Lipitor [atorvastatin]  09/27/2015   Tramadol  Anxiety 12/05/2016    Medications:  Current Outpatient Medications:    allopurinol (ZYLOPRIM) 100 MG tablet, Take 100 mg by mouth daily., Disp: , Rfl:    colchicine 0.6 MG tablet, , Disp: , Rfl:    Continuous Blood Gluc Sensor (FREESTYLE LIBRE 2 SENSOR) MISC, , Disp: , Rfl:    ELDERBERRY PO, Take 1 capsule by mouth daily., Disp: , Rfl:    EPINEPHrine  0.3 mg/0.3 mL IJ SOAJ injection, Inject into the muscle., Disp: , Rfl:    insulin  aspart (NOVOLOG  FLEXPEN) 100 UNIT/ML FlexPen, Novolog  FlexPen U-100 Insulin , Disp: , Rfl:    insulin  degludec (TRESIBA ) 200 UNIT/ML FlexTouch Pen, Inject 124 Units into the skin daily. , Disp: , Rfl:    levothyroxine  (SYNTHROID ) 137 MCG tablet, Take 137 mcg by mouth daily., Disp: , Rfl:    loratadine (CLARITIN) 10 MG tablet, Take 10 mg by mouth daily., Disp: , Rfl:    losartan-hydrochlorothiazide (HYZAAR) 50-12.5 MG tablet, Take 1 tablet by mouth daily., Disp: , Rfl:    metFORMIN (GLUCOPHAGE) 1000 MG tablet, Take 1,000-1,500 mg by mouth See admin instructions. 1000 mg in AM. 1500 mg in PM., Disp: , Rfl:    MOUNJARO 7.5 MG/0.5ML Pen, SMARTSIG:0.5 Milliliter(s) SUB-Q Once a Week, Disp: , Rfl:    Multiple Vitamin (MULTIVITAMIN) tablet, Take 1 tablet by mouth daily., Disp: , Rfl:    pantoprazole  (PROTONIX ) 40 MG tablet, Take 40 mg by mouth 2 (two) times daily., Disp: , Rfl:    pramipexole (MIRAPEX) 0.5 MG tablet, Take 0.5 mg by mouth daily., Disp: , Rfl:    simvastatin  (ZOCOR ) 40 MG tablet, Take 40 mg by mouth at bedtime. , Disp: , Rfl:    tamsulosin  (FLOMAX ) 0.4 MG CAPS capsule, Take 1 capsule (0.4 mg total) by mouth daily., Disp: 90 capsule, Rfl: 3   Vibegron  (GEMTESA ) 75 MG TABS, Take 1 tablet (75 mg total) by mouth daily., Disp: 90 tablet, Rfl: 3   vitamin B-12 (CYANOCOBALAMIN ) 500 MCG tablet, Take 500 mcg by mouth daily., Disp: ,  Rfl:   Social History: Social History   Tobacco Use   Smoking status: Never   Smokeless tobacco: Never  Vaping Use   Vaping status: Never Used  Substance Use Topics   Alcohol  use: Never   Drug use: Never    Family Medical History: Family History  Problem Relation Age of Onset   Prostate cancer Father    Bladder Cancer Neg Hx    Kidney cancer Neg Hx     Physical Examination: Vitals:   01/19/24 0928  BP: (!) 176/80    General: Patient is in no apparent distress. Attention to examination is appropriate.  Neck:   Supple.  Full range of motion.  Respiratory: Patient is breathing without any difficulty.   NEUROLOGICAL:     Awake, alert, oriented to person, place, and time.  Speech is clear and fluent.   Cranial  Nerves: Pupils equal round and reactive to light.  Facial tone is symmetric.  Facial sensation is symmetric. Shoulder shrug is symmetric. Tongue protrusion is midline.    Strength: No major deficits noted  Gait is normal.    Imaging: Narrative & Impression  CLINICAL DATA:  Right sided headache; Right sided pain.   EXAM: MRI HEAD WITHOUT AND WITH CONTRAST   MRI CERVICAL SPINE WITHOUT AND WITH CONTRAST   CONTRAST:  10mL GADAVIST  GADOBUTROL  1 MMOL/ML IV SOLN   TECHNIQUE: Multiplanar, multiecho pulse sequences of the brain and surrounding structures, and cervical and thoracic spine were obtained without and with intravenous contrast.   COMPARISON:  None Available.   CT head 10/07/2023   FINDINGS: MRI HEAD FINDINGS   Brain: Trace FLAIR hyperintensity with mild restricted diffusion and susceptibility artifact along the posterior right falx, suggestive of trace subdural hemorrhage. No mass effect. No evidence of acute infarct, mass lesion, midline shift or hydrocephalus.   Vascular: Normal flow voids.   Skull and upper cervical spine: Normal marrow signal.   Sinuses/Orbits: Negative.   MRI CERVICAL SPINE FINDINGS   Alignment:  Straightening.   Vertebrae: No fracture, evidence of discitis, or bone lesion.   Cord: Normal cord signal.   Posterior Fossa, vertebral arteries, paraspinal tissues: Negative.   Disc levels:   C2-C3: No significant disc protrusion, foraminal stenosis, or canal stenosis.   C3-C4: Bilateral facet and uncovertebral hypertrophy contributes to moderate left and mild right foraminal stenosis. Patent canal.   C4-C5: Posterior disc osteophyte complex with bilateral facet and uncovertebral hypertrophy. Resulting severe bilateral foraminal stenosis and moderate canal stenosis.   C5-C6: Posterior disc osteophyte complex and ligamentum flavum thickening. Bilateral facet hypertrophy. Resulting moderate to severe right greater than left foraminal stenosis. Mild to moderate canal stenosis.   C6-C7: Posterior disc osteophyte complex and left greater than right facet and uncovertebral hypertrophy. Resulting moderate left and mild right foraminal stenosis. Patent canal.   C7-T1: No significant disc protrusion, foraminal stenosis, or canal stenosis.   IMPRESSION: MRI head:   Suspected trace subdural hemorrhage along the posterior right falx which in retrospect was likely present on recent CT head. No mass effect.   MRI cervical spine:   1. At C4-C5, severe bilateral foraminal stenosis and moderate canal stenosis. 2. At C5-C6, moderate to severe right greater than left foraminal stenosis and mild to moderate canal stenosis. 3. At C3-C4 and C6-C7, moderate left and mild right foraminal stenosis.   Findings discussed with Dr. Nicholaus via telephone at 3:11 AM     Electronically Signed   By: Gilmore GORMAN Molt M.D.   On: 10/08/2023 03:26     Assessment and Plan Assessment & Plan Cervical spondylosis with history of spinal cord injury and myelomalacia Chronic cervical spondylosis with prior spinal cord injury and myelomalacia, well-managed for six years post-trauma. He reports neck  tightness and intermittent pain without neurological decline. Examination demonstrates preserved strength and normal reflexes. Symptoms remain controlled with nonoperative management. Surgical intervention is not indicated given current stability and symptom control, but may be reconsidered if neurological function deteriorates or quality of life declines. Radiographic confirmation of spinal stability is required. - Ordered cervical spine flexion x-rays to assess for instability. - Advised return for worsening symptoms, including increased pain, weakness, or functional decline in upper extremities or gait. - Discussed potential for future surgical intervention if symptoms progress or quality of life is significantly affected. - Reinforced adherence to home exercise program.  Thank you for involving me in  the care of this patient.    Penne MICAEL Sharps MD/MSCR Neurosurgery  "

## 2024-02-03 ENCOUNTER — Ambulatory Visit: Payer: Self-pay | Admitting: Neurosurgery

## 2024-06-30 ENCOUNTER — Ambulatory Visit: Admitting: Urology
